# Patient Record
Sex: Male | Born: 1955
Health system: Southern US, Community
[De-identification: ages and names within clinical notes are randomized; demographics above are authoritative.]

## PROBLEM LIST (undated history)

## (undated) DIAGNOSIS — N529 Male erectile dysfunction, unspecified: Secondary | ICD-10-CM

## (undated) DIAGNOSIS — C801 Malignant (primary) neoplasm, unspecified: Secondary | ICD-10-CM

## (undated) DIAGNOSIS — E785 Hyperlipidemia, unspecified: Secondary | ICD-10-CM

## (undated) DIAGNOSIS — I10 Essential (primary) hypertension: Secondary | ICD-10-CM

## (undated) HISTORY — PX: RADICAL ORCHIECTOMY: SHX2285

## (undated) HISTORY — PX: HERNIA REPAIR: SHX51

## (undated) HISTORY — DX: Male erectile dysfunction, unspecified: N52.9

## (undated) HISTORY — DX: Hyperlipidemia, unspecified: E78.5

## (undated) HISTORY — DX: Malignant (primary) neoplasm, unspecified: C80.1

## (undated) HISTORY — DX: Essential (primary) hypertension: I10

---

## 2009-09-26 ENCOUNTER — Ambulatory Visit: Payer: Self-pay | Admitting: Gastroenterology

## 2011-02-18 ENCOUNTER — Ambulatory Visit: Payer: Self-pay | Admitting: Surgery

## 2011-02-25 ENCOUNTER — Ambulatory Visit: Payer: Self-pay | Admitting: Surgery

## 2011-03-13 ENCOUNTER — Ambulatory Visit: Payer: Self-pay | Admitting: Gastroenterology

## 2011-03-18 LAB — PATHOLOGY REPORT

## 2013-11-10 ENCOUNTER — Ambulatory Visit: Payer: Self-pay | Admitting: Urology

## 2013-11-19 DIAGNOSIS — N138 Other obstructive and reflux uropathy: Secondary | ICD-10-CM | POA: Insufficient documentation

## 2013-11-19 DIAGNOSIS — N401 Enlarged prostate with lower urinary tract symptoms: Secondary | ICD-10-CM

## 2013-12-01 DIAGNOSIS — C772 Secondary and unspecified malignant neoplasm of intra-abdominal lymph nodes: Secondary | ICD-10-CM | POA: Insufficient documentation

## 2013-12-01 DIAGNOSIS — C6290 Malignant neoplasm of unspecified testis, unspecified whether descended or undescended: Secondary | ICD-10-CM | POA: Insufficient documentation

## 2014-05-02 ENCOUNTER — Ambulatory Visit: Payer: Self-pay | Admitting: Gastroenterology

## 2014-09-11 ENCOUNTER — Other Ambulatory Visit: Payer: Self-pay

## 2014-09-11 DIAGNOSIS — I10 Essential (primary) hypertension: Secondary | ICD-10-CM | POA: Insufficient documentation

## 2014-09-11 DIAGNOSIS — I429 Cardiomyopathy, unspecified: Secondary | ICD-10-CM | POA: Insufficient documentation

## 2014-09-11 DIAGNOSIS — R030 Elevated blood-pressure reading, without diagnosis of hypertension: Secondary | ICD-10-CM | POA: Insufficient documentation

## 2014-09-11 DIAGNOSIS — N529 Male erectile dysfunction, unspecified: Secondary | ICD-10-CM | POA: Insufficient documentation

## 2014-09-11 DIAGNOSIS — G4730 Sleep apnea, unspecified: Secondary | ICD-10-CM | POA: Insufficient documentation

## 2014-09-11 DIAGNOSIS — N5089 Other specified disorders of the male genital organs: Secondary | ICD-10-CM | POA: Insufficient documentation

## 2014-09-12 ENCOUNTER — Ambulatory Visit (INDEPENDENT_AMBULATORY_CARE_PROVIDER_SITE_OTHER): Payer: BLUE CROSS/BLUE SHIELD | Admitting: Family Medicine

## 2014-09-12 ENCOUNTER — Encounter: Payer: Self-pay | Admitting: Family Medicine

## 2014-09-12 VITALS — BP 140/80 | HR 80 | Ht 70.0 in | Wt 225.0 lb

## 2014-09-12 DIAGNOSIS — I10 Essential (primary) hypertension: Secondary | ICD-10-CM | POA: Diagnosis not present

## 2014-09-12 DIAGNOSIS — E785 Hyperlipidemia, unspecified: Secondary | ICD-10-CM

## 2014-09-12 DIAGNOSIS — N528 Other male erectile dysfunction: Secondary | ICD-10-CM | POA: Diagnosis not present

## 2014-09-12 DIAGNOSIS — G629 Polyneuropathy, unspecified: Secondary | ICD-10-CM

## 2014-09-12 DIAGNOSIS — R609 Edema, unspecified: Secondary | ICD-10-CM

## 2014-09-12 DIAGNOSIS — N529 Male erectile dysfunction, unspecified: Secondary | ICD-10-CM

## 2014-09-12 MED ORDER — HYDROCHLOROTHIAZIDE 12.5 MG PO TABS: 12.5000 mg | ORAL_TABLET | Freq: Every day | ORAL | Status: DC

## 2014-09-12 MED ORDER — METOPROLOL TARTRATE 50 MG PO TABS: 50.0000 mg | ORAL_TABLET | Freq: Two times a day (BID) | ORAL | Status: DC

## 2014-09-12 MED ORDER — TADALAFIL 20 MG PO TABS: 20.0000 mg | ORAL_TABLET | ORAL | Status: DC | PRN

## 2014-09-12 MED ORDER — PREGABALIN 50 MG PO CAPS: 50.0000 mg | ORAL_CAPSULE | Freq: Three times a day (TID) | ORAL | Status: DC

## 2014-09-12 MED ORDER — LISINOPRIL 20 MG PO TABS: 20.0000 mg | ORAL_TABLET | Freq: Every day | ORAL | Status: DC

## 2014-09-12 NOTE — Progress Notes (Signed)
Name: Jerry Harmon   MRN: [id number]    DOB: 08-08-1955   Date:09/12/2014       Progress Note  Subjective  Chief Complaint  Chief Complaint  Patient presents with  . Erectile Dysfunction  . Hypertension  . Hyperlipidemia    Erectile Dysfunction This is a recurrent problem. The current episode started more than 1 year ago. The problem has been gradually improving since onset. The nature of his difficulty is achieving erection and maintaining erection. He reports no anxiety, decreased libido or performance anxiety. Irritative symptoms do not include frequency, nocturia or urgency. Obstructive symptoms do not include dribbling, an intermittent stream, straining or a weak stream. Pertinent negatives include no chills, dysuria or genital pain. Nothing aggravates the symptoms. Past treatments include sildenafil. The treatment provided no relief. He has been using treatment for 2 or more years. He has had no adverse reactions caused by medications. There are no known risk factors.  Hypertension This is a chronic problem. The current episode started more than 1 year ago. The problem is unchanged. The problem is controlled. Associated symptoms include shortness of breath. Pertinent negatives include no anxiety, blurred vision, chest pain, headaches, malaise/fatigue, neck pain, orthopnea, palpitations, peripheral edema, PND or sweats. There are no associated agents to hypertension. Risk factors for coronary artery disease include dyslipidemia, male gender and obesity. Past treatments include ACE inhibitors, calcium channel blockers and beta blockers. The current treatment provides no improvement. Compliance problems include medication side effects.  There is no history of angina, CVA, heart failure, left ventricular hypertrophy or renovascular disease. There is no history of chronic renal disease.  Hyperlipidemia This is a recurrent problem. The current episode started more than 1 year ago. The problem is  controlled. Recent lipid tests were reviewed and are normal. Exacerbating diseases include obesity. He has no history of chronic renal disease, diabetes, hypothyroidism, liver disease or nephrotic syndrome. Associated symptoms include shortness of breath. Pertinent negatives include no chest pain. Current antihyperlipidemic treatment includes diet change. The current treatment provides no improvement of lipids. There are no compliance problems.  There are no known risk factors for coronary artery disease.    No problem-specific assessment & plan notes found for this encounter.   Past Medical History  Diagnosis Date  . Hyperlipidemia   . Hypertension   . Cancer     testicular  . Erectile dysfunction     Past Surgical History  Procedure Laterality Date  . Hernia repair    . Radical orchiectomy      Family History  Problem Relation Age of Onset  . Heart disease Mother   . Heart disease Brother     History   Social History  . Marital Status: Married    Spouse Name: N/A  . Number of Children: N/A  . Years of Education: N/A   Occupational History  . Not on file.   Social History Main Topics  . Smoking status: Former Research scientist (life sciences)  . Smokeless tobacco: Not on file  . Alcohol Use: No  . Drug Use: No  . Sexual Activity: Yes    Birth Control/ Protection: None   Other Topics Concern  . Not on file   Social History Narrative  . No narrative on file    No Known Allergies   Review of Systems  Constitutional: Negative for fever, chills and malaise/fatigue.  Eyes: Negative for blurred vision.  Respiratory: Positive for shortness of breath. Negative for cough and wheezing.   Cardiovascular: Negative for chest  pain, palpitations, orthopnea and PND.  Gastrointestinal: Negative for heartburn, nausea, vomiting, abdominal pain, diarrhea and constipation.  Genitourinary: Negative for dysuria, urgency, frequency, decreased libido and nocturia.  Musculoskeletal: Negative for joint pain  and neck pain.  Neurological: Negative for dizziness and headaches.  Endo/Heme/Allergies: Negative for polydipsia.  Psychiatric/Behavioral: Negative for depression, suicidal ideas and substance abuse. The patient is not nervous/anxious.      Objective  Filed Vitals:   09/12/14 1455  BP: 140/80  Pulse: 80  Height: 5' 10" (1.778 m)  Weight: 225 lb (102.059 kg)    Physical Exam  Constitutional: He is oriented to person, place, and time and well-developed, well-nourished, and in no distress.  HENT:  Head: Normocephalic.  Right Ear: External ear normal.  Left Ear: External ear normal.  Nose: Nose normal.  Mouth/Throat: Oropharynx is clear and moist.  Eyes: Conjunctivae and EOM are normal. Pupils are equal, round, and reactive to light. Right eye exhibits no discharge. Left eye exhibits no discharge. No scleral icterus.  Neck: Normal range of motion. Neck supple. No JVD present. No tracheal deviation present. No thyromegaly present.  Cardiovascular: Normal rate, regular rhythm, normal heart sounds and intact distal pulses.  Exam reveals no gallop and no friction rub.   No murmur heard. Pulmonary/Chest: Breath sounds normal. No respiratory distress. He has no wheezes. He has no rales.  Abdominal: Soft. Bowel sounds are normal. He exhibits no mass. There is no hepatosplenomegaly. There is no tenderness. There is no rebound, no guarding and no CVA tenderness.  Musculoskeletal: Normal range of motion. He exhibits no edema or tenderness.  Lymphadenopathy:    He has no cervical adenopathy.  Neurological: He is alert and oriented to person, place, and time. He has normal sensation, normal strength, normal reflexes and intact cranial nerves. No cranial nerve deficit.  Skin: Skin is warm. No rash noted.  Psychiatric: Mood and affect normal.      No results found for this or any previous visit (from the past 2160 hour(s)).   Assessment & Plan  Problem List Items Addressed This Visit       Cardiovascular and Mediastinum   BP (high blood pressure) - Primary   Relevant Medications   hydrochlorothiazide (HYDRODIURIL) 12.5 MG tablet   lisinopril (PRINIVIL,ZESTRIL) 20 MG tablet   metoprolol (LOPRESSOR) 50 MG tablet   tadalafil (CIALIS) 20 MG tablet   Other Relevant Orders   Renal Function Panel     Genitourinary   ED (erectile dysfunction) of organic origin   Relevant Medications   tadalafil (CIALIS) 20 MG tablet    Other Visit Diagnoses    Hyperlipidemia        Relevant Medications    hydrochlorothiazide (HYDRODIURIL) 12.5 MG tablet    lisinopril (PRINIVIL,ZESTRIL) 20 MG tablet    metoprolol (LOPRESSOR) 50 MG tablet    tadalafil (CIALIS) 20 MG tablet    Other Relevant Orders    Lipid Profile    Neuropathy        Relevant Medications    pregabalin (LYRICA) 50 MG capsule    Other Relevant Orders    Sed Rate (ESR)    Edema        Relevant Medications    hydrochlorothiazide (HYDRODIURIL) 12.5 MG tablet         Dr. Deanna Jones [facility]  09/12/2014    

## 2014-09-13 LAB — RENAL FUNCTION PANEL
Albumin: 4.5 g/dL (ref 3.5–5.5)
BUN/Creatinine Ratio: 18 (ref 9–20)
BUN: 20 mg/dL (ref 6–24)
CO2: 24 mmol/L (ref 18–29)
CREATININE: 1.13 mg/dL (ref 0.76–1.27)
Calcium: 10.6 mg/dL — ABNORMAL HIGH (ref 8.7–10.2)
Chloride: 102 mmol/L (ref 97–108)
GFR calc Af Amer: 84 mL/min/{1.73_m2} (ref >59)
GFR calc non Af Amer: 72 mL/min/{1.73_m2} (ref >59)
Glucose: 107 mg/dL — ABNORMAL HIGH (ref 65–99)
Phosphorus: 3.7 mg/dL (ref 2.5–4.5)
Potassium: 4.4 mmol/L (ref 3.5–5.2)
SODIUM: 143 mmol/L (ref 134–144)

## 2014-09-13 LAB — LIPID PANEL
Chol/HDL Ratio: 3.4 ratio units (ref 0.0–5.0)
Cholesterol, Total: 179 mg/dL (ref 100–199)
HDL: 52 mg/dL (ref >39)
LDL CALC: 111 mg/dL — AB (ref 0–99)
Triglycerides: 79 mg/dL (ref 0–149)
VLDL Cholesterol Cal: 16 mg/dL (ref 5–40)

## 2014-09-13 LAB — SEDIMENTATION RATE: Sed Rate: 14 mm/hr (ref 0–30)

## 2014-09-14 ENCOUNTER — Ambulatory Visit (INDEPENDENT_AMBULATORY_CARE_PROVIDER_SITE_OTHER): Payer: BLUE CROSS/BLUE SHIELD

## 2014-09-14 DIAGNOSIS — Z23 Encounter for immunization: Secondary | ICD-10-CM | POA: Diagnosis not present

## 2014-10-24 ENCOUNTER — Encounter: Payer: Self-pay | Admitting: Family Medicine

## 2014-10-24 ENCOUNTER — Ambulatory Visit (INDEPENDENT_AMBULATORY_CARE_PROVIDER_SITE_OTHER): Payer: BLUE CROSS/BLUE SHIELD | Admitting: Family Medicine

## 2014-10-24 VITALS — BP 140/82 | HR 82 | Resp 16 | Ht 70.0 in | Wt 232.5 lb

## 2014-10-24 DIAGNOSIS — M1990 Unspecified osteoarthritis, unspecified site: Secondary | ICD-10-CM

## 2014-10-24 DIAGNOSIS — M064 Inflammatory polyarthropathy: Secondary | ICD-10-CM | POA: Diagnosis not present

## 2014-10-24 NOTE — Progress Notes (Addendum)
Name: Jerry Harmon   MRN: [id number]    DOB: [birth year]   Date:10/24/2014       Progress Note  Subjective  Chief Complaint  Chief Complaint  Patient presents with  . Follow-up    1 month  . Hypertension    Patient states that he is still having some swelling.   . Peripheral Neuropathy    Patient states that Lyrica is helping some.     Hypertension This is a recurrent problem. The current episode started more than 1 year ago. The problem has been gradually improving since onset. The problem is controlled. Pertinent negatives include no anxiety, blurred vision, chest pain, headaches, malaise/fatigue, neck pain, orthopnea, palpitations, peripheral edema, PND, shortness of breath or sweats. There are no associated agents to hypertension. There are no known risk factors for coronary artery disease. Past treatments include diuretics. The current treatment provides mild improvement. There are no compliance problems.  There is no history of angina, kidney disease, CAD/MI, CVA, heart failure, left ventricular hypertrophy, PVD or retinopathy. There is no history of chronic renal disease.    No problem-specific assessment & plan notes found for this encounter.   Past Medical History  Diagnosis Date  . Hyperlipidemia   . Hypertension   . Cancer     testicular  . Erectile dysfunction     Past Surgical History  Procedure Laterality Date  . Hernia repair    . Radical orchiectomy      Family History  Problem Relation Age of Onset  . Heart disease Mother   . Heart disease Brother   . Heart attack Father     History   Social History  . Marital Status: Married    Spouse Name: N/A  . Number of Children: N/A  . Years of Education: N/A   Occupational History  . Not on file.   Social History Main Topics  . Smoking status: Former Research scientist (life sciences)  . Smokeless tobacco: Not on file  . Alcohol Use: No  . Drug Use: No  . Sexual Activity: Yes    Birth Control/ Protection: None   Other Topics  Concern  . Not on file   Social History Narrative    No Known Allergies   Review of Systems  Constitutional: Negative for fever, chills, weight loss and malaise/fatigue.  HENT: Negative for ear discharge, ear pain and sore throat.   Eyes: Negative for blurred vision.  Respiratory: Negative for cough, sputum production, shortness of breath and wheezing.   Cardiovascular: Negative for chest pain, palpitations, orthopnea, leg swelling and PND.  Gastrointestinal: Negative for heartburn, nausea, abdominal pain, diarrhea, constipation, blood in stool and melena.  Genitourinary: Negative for dysuria, urgency, frequency and hematuria.  Musculoskeletal: Negative for myalgias, back pain, joint pain and neck pain.  Skin: Negative for rash.  Neurological: Negative for dizziness, tingling, sensory change, focal weakness and headaches.  Endo/Heme/Allergies: Negative for environmental allergies and polydipsia. Does not bruise/bleed easily.  Psychiatric/Behavioral: Negative for depression and suicidal ideas. The patient is not nervous/anxious and does not have insomnia.      Objective  Filed Vitals:   10/24/14 1426  BP: 140/82  Pulse: 82  Resp: 16  Height: 5\' 10"  (1.778 m)  Weight: 232 lb 8 oz (105.461 kg)    Physical Exam  Constitutional: He is oriented to person, place, and time and well-developed, well-nourished, and in no distress.  HENT:  Head: Normocephalic.  Right Ear: External ear normal.  Left Ear: External ear normal.  Nose:  Nose normal.  Mouth/Throat: Oropharynx is clear and moist.  Eyes: Conjunctivae and EOM are normal. Pupils are equal, round, and reactive to light. Right eye exhibits no discharge. Left eye exhibits no discharge. No scleral icterus.  Neck: Normal range of motion. Neck supple. No JVD present. No tracheal deviation present. No thyromegaly present.  Cardiovascular: Normal rate, regular rhythm, normal heart sounds and intact distal pulses.  Exam reveals no  gallop and no friction rub.   No murmur heard. Pulmonary/Chest: Breath sounds normal. No respiratory distress. He has no wheezes. He has no rales.  Abdominal: Soft. Bowel sounds are normal. He exhibits no mass. There is no hepatosplenomegaly. There is no tenderness. There is no rebound, no guarding and no CVA tenderness.  Musculoskeletal: Normal range of motion. He exhibits no edema or tenderness.  Lymphadenopathy:    He has no cervical adenopathy.  Neurological: He is alert and oriented to person, place, and time. He has normal sensation, normal strength, normal reflexes and intact cranial nerves. No cranial nerve deficit.  Skin: Skin is warm. No rash noted.  Psychiatric: Mood and affect normal.  Nursing note and vitals reviewed.     Assessment & Plan  Problem List Items Addressed This Visit    None    Visit Diagnoses    Inflammatory arthropathy    -  Primary    Relevant Medications    ibuprofen (ADVIL,MOTRIN) 200 MG tablet    Other Relevant Orders    Ambulatory referral to Rheumatology         Dr. Otilio Miu [facility]  10/24/2014

## 2014-10-24 NOTE — Addendum Note (Signed)
Addended by: Juline Patch on: 10/24/2014 02:55 PM   Modules accepted: Miquel Dunn

## 2014-11-12 ENCOUNTER — Other Ambulatory Visit: Payer: Self-pay | Admitting: Family Medicine

## 2014-11-12 DIAGNOSIS — G629 Polyneuropathy, unspecified: Secondary | ICD-10-CM

## 2015-01-14 ENCOUNTER — Other Ambulatory Visit: Payer: Self-pay | Admitting: Family Medicine

## 2015-02-12 ENCOUNTER — Other Ambulatory Visit: Payer: Self-pay | Admitting: Family Medicine

## 2015-03-04 ENCOUNTER — Other Ambulatory Visit: Payer: Self-pay | Admitting: Family Medicine

## 2015-03-05 ENCOUNTER — Other Ambulatory Visit: Payer: Self-pay

## 2015-03-12 ENCOUNTER — Encounter: Payer: Self-pay | Admitting: Family Medicine

## 2015-03-12 ENCOUNTER — Ambulatory Visit (INDEPENDENT_AMBULATORY_CARE_PROVIDER_SITE_OTHER): Payer: BLUE CROSS/BLUE SHIELD | Admitting: Family Medicine

## 2015-03-12 VITALS — BP 120/70 | HR 60 | Ht 70.0 in | Wt 234.0 lb

## 2015-03-12 DIAGNOSIS — T451X5A Adverse effect of antineoplastic and immunosuppressive drugs, initial encounter: Secondary | ICD-10-CM

## 2015-03-12 DIAGNOSIS — G620 Drug-induced polyneuropathy: Secondary | ICD-10-CM | POA: Diagnosis not present

## 2015-03-12 DIAGNOSIS — R5382 Chronic fatigue, unspecified: Secondary | ICD-10-CM | POA: Diagnosis not present

## 2015-03-12 DIAGNOSIS — I10 Essential (primary) hypertension: Secondary | ICD-10-CM

## 2015-03-12 DIAGNOSIS — E785 Hyperlipidemia, unspecified: Secondary | ICD-10-CM

## 2015-03-12 MED ORDER — LISINOPRIL 20 MG PO TABS: ORAL_TABLET | ORAL | Status: DC

## 2015-03-12 MED ORDER — METOPROLOL TARTRATE 50 MG PO TABS: ORAL_TABLET | ORAL | Status: DC

## 2015-03-12 MED ORDER — PREGABALIN 75 MG PO CAPS: 75.0000 mg | ORAL_CAPSULE | Freq: Two times a day (BID) | ORAL | Status: DC

## 2015-03-12 MED ORDER — FISH OIL BURP-LESS 1000 MG PO CAPS: 1.0000 | ORAL_CAPSULE | Freq: Every day | ORAL | Status: DC

## 2015-03-12 NOTE — Patient Instructions (Signed)

## 2015-03-12 NOTE — Progress Notes (Signed)
Name: Jerry Harmon   MRN: [id number]    DOB: 02-07-1956   Date:03/12/2015       Progress Note  Subjective  Chief Complaint  Chief Complaint  Patient presents with  . Hypertension  . Hyperlipidemia  . Peripheral Neuropathy    needs refill on Lyrica    Hypertension This is a chronic problem. The current episode started more than 1 year ago. The problem has been waxing and waning since onset. The problem is controlled. Pertinent negatives include no anxiety, blurred vision, chest pain, headaches, malaise/fatigue, neck pain, orthopnea, palpitations, peripheral edema, PND, shortness of breath or sweats. There are no associated agents to hypertension. Risk factors for coronary artery disease include dyslipidemia and obesity. Past treatments include diuretics, beta blockers and ACE inhibitors. The current treatment provides moderate improvement. Hypertensive end-organ damage includes a thyroid problem. There is no history of angina, kidney disease, CAD/MI, CVA, heart failure, left ventricular hypertrophy, PVD, renovascular disease or retinopathy. There is no history of chronic renal disease or a hypertension causing med.  Hyperlipidemia This is a chronic problem. The current episode started more than 1 year ago. The problem is controlled. Recent lipid tests were reviewed and are normal. Exacerbating diseases include obesity. He has no history of chronic renal disease, diabetes, hypothyroidism, liver disease or nephrotic syndrome. There are no known factors aggravating his hyperlipidemia. Pertinent negatives include no chest pain, focal sensory loss, focal weakness, leg pain, myalgias or shortness of breath. Current antihyperlipidemic treatment includes statins. The current treatment provides mild improvement of lipids. There are no compliance problems.  Risk factors for coronary artery disease include dyslipidemia and hypertension.  Leg Pain  The incident occurred more than 1 week ago. There was no  injury mechanism (chemotherapy).  Thyroid Problem Presents for follow-up visit. Symptoms include depressed mood and fatigue. Patient reports no anxiety, cold intolerance, constipation, diaphoresis, diarrhea, dry skin, heat intolerance, hoarse voice, leg swelling, nail problem, palpitations, tremors, visual change, weight gain or weight loss. The symptoms have been stable. Past treatments include nothing. The treatment provided mild relief. His past medical history is significant for hyperlipidemia and obesity. There is no history of atrial fibrillation, dementia, diabetes, Graves' ophthalmopathy, heart failure or neuropathy.    No problem-specific assessment & plan notes found for this encounter.   Past Medical History  Diagnosis Date  . Hyperlipidemia   . Hypertension   . Cancer ([place])     testicular  . Erectile dysfunction     Past Surgical History  Procedure Laterality Date  . Hernia repair    . Radical orchiectomy      Family History  Problem Relation Age of Onset  . Heart disease Mother   . Heart disease Brother   . Heart attack Father     Social History   Social History  . Marital Status: Married    Spouse Name: N/A  . Number of Children: N/A  . Years of Education: N/A   Occupational History  . Not on file.   Social History Main Topics  . Smoking status: Former Research scientist (life sciences)  . Smokeless tobacco: Not on file  . Alcohol Use: No  . Drug Use: No  . Sexual Activity: Yes    Birth Control/ Protection: None   Other Topics Concern  . Not on file   Social History Narrative    No Known Allergies   Review of Systems  Constitutional: Positive for fatigue. Negative for weight loss, weight gain, malaise/fatigue and diaphoresis.  HENT: Negative for hoarse  voice.   Eyes: Negative for blurred vision.  Respiratory: Negative for shortness of breath.   Cardiovascular: Negative for chest pain, palpitations, orthopnea and PND.  Gastrointestinal: Negative for diarrhea and  constipation.  Musculoskeletal: Negative for myalgias and neck pain.  Neurological: Negative for tremors, focal weakness and headaches.  Endo/Heme/Allergies: Negative for cold intolerance and heat intolerance.  Psychiatric/Behavioral: The patient is not nervous/anxious.      Objective  Filed Vitals:   03/12/15 1557  BP: 120/70  Pulse: 60  Height: 5' 10" (1.778 m)  Weight: 234 lb (106.142 kg)    Physical Exam  Constitutional: He is oriented to person, place, and time and well-developed, well-nourished, and in no distress.  HENT:  Head: Normocephalic.  Right Ear: External ear normal.  Left Ear: External ear normal.  Nose: Nose normal.  Mouth/Throat: Oropharynx is clear and moist.  Eyes: Conjunctivae and EOM are normal. Pupils are equal, round, and reactive to light. Right eye exhibits no discharge. Left eye exhibits no discharge. No scleral icterus.  Neck: Normal range of motion. Neck supple. No JVD present. No tracheal deviation present. No thyromegaly present.  Cardiovascular: Normal rate, regular rhythm, normal heart sounds and intact distal pulses.  Exam reveals no gallop and no friction rub.   No murmur heard. Pulmonary/Chest: Breath sounds normal. No respiratory distress. He has no wheezes. He has no rales.  Abdominal: Soft. Bowel sounds are normal. He exhibits no mass. There is no hepatosplenomegaly. There is no tenderness. There is no rebound, no guarding and no CVA tenderness.  Musculoskeletal: Normal range of motion. He exhibits no edema or tenderness.  Lymphadenopathy:    He has no cervical adenopathy.  Neurological: He is alert and oriented to person, place, and time. He has normal sensation, normal strength, normal reflexes and intact cranial nerves. No cranial nerve deficit.  Skin: Skin is warm. No rash noted.  Psychiatric: Mood and affect normal.      Assessment & Plan  Problem List Items Addressed This Visit      Cardiovascular and Mediastinum   BP (high  blood pressure) - Primary   Relevant Medications   lisinopril (PRINIVIL,ZESTRIL) 20 MG tablet   metoprolol (LOPRESSOR) 50 MG tablet   Other Relevant Orders   Renal Function Panel    Other Visit Diagnoses    Hyperlipidemia        Relevant Medications    lisinopril (PRINIVIL,ZESTRIL) 20 MG tablet    metoprolol (LOPRESSOR) 50 MG tablet    Omega-3 Fatty Acids (FISH OIL BURP-LESS) 1000 MG CAPS    Other Relevant Orders    Lipid Profile    Cisplatin induced neuropathy (HCC)        Relevant Medications    pregabalin (LYRICA) 75 MG capsule    Chronic fatigue        Relevant Orders    Sed Rate (ESR)    TSH         Dr. Deanna Jones [facility]  03/12/2015

## 2015-03-13 LAB — SEDIMENTATION RATE: SED RATE: 15 mm/h (ref 0–30)

## 2015-03-13 LAB — LIPID PANEL
CHOLESTEROL TOTAL: 172 mg/dL (ref 100–199)
Chol/HDL Ratio: 3.4 ratio units (ref 0.0–5.0)
HDL: 51 mg/dL (ref >39)
LDL Calculated: 106 mg/dL — ABNORMAL HIGH (ref 0–99)
Triglycerides: 77 mg/dL (ref 0–149)
VLDL CHOLESTEROL CAL: 15 mg/dL (ref 5–40)

## 2015-03-13 LAB — TSH: TSH: 2.6 u[IU]/mL (ref 0.450–4.500)

## 2015-03-13 LAB — RENAL FUNCTION PANEL: Phosphorus: 3.2 mg/dL (ref 2.5–4.5)

## 2015-03-23 ENCOUNTER — Other Ambulatory Visit: Payer: Self-pay | Admitting: Family Medicine

## 2015-03-28 ENCOUNTER — Other Ambulatory Visit: Payer: Self-pay

## 2015-03-28 DIAGNOSIS — J329 Chronic sinusitis, unspecified: Secondary | ICD-10-CM

## 2015-03-28 DIAGNOSIS — J309 Allergic rhinitis, unspecified: Secondary | ICD-10-CM

## 2015-03-28 MED ORDER — LORATADINE 10 MG PO TABS: 10.0000 mg | ORAL_TABLET | Freq: Every day | ORAL | Status: DC

## 2015-03-28 MED ORDER — AZITHROMYCIN 250 MG PO TABS: ORAL_TABLET | ORAL | Status: DC

## 2015-07-29 ENCOUNTER — Other Ambulatory Visit: Payer: Self-pay | Admitting: Family Medicine

## 2015-07-31 DIAGNOSIS — G5601 Carpal tunnel syndrome, right upper limb: Secondary | ICD-10-CM | POA: Diagnosis not present

## 2015-07-31 DIAGNOSIS — M65311 Trigger thumb, right thumb: Secondary | ICD-10-CM | POA: Diagnosis not present

## 2015-07-31 DIAGNOSIS — G629 Polyneuropathy, unspecified: Secondary | ICD-10-CM | POA: Diagnosis not present

## 2015-07-31 DIAGNOSIS — G5602 Carpal tunnel syndrome, left upper limb: Secondary | ICD-10-CM | POA: Diagnosis not present

## 2015-08-16 DIAGNOSIS — C6211 Malignant neoplasm of descended right testis: Secondary | ICD-10-CM | POA: Diagnosis not present

## 2015-08-16 DIAGNOSIS — C6290 Malignant neoplasm of unspecified testis, unspecified whether descended or undescended: Secondary | ICD-10-CM | POA: Diagnosis not present

## 2015-08-16 DIAGNOSIS — R918 Other nonspecific abnormal finding of lung field: Secondary | ICD-10-CM | POA: Diagnosis not present

## 2015-08-16 DIAGNOSIS — Z6830 Body mass index (BMI) 30.0-30.9, adult: Secondary | ICD-10-CM | POA: Diagnosis not present

## 2015-08-16 DIAGNOSIS — I700 Atherosclerosis of aorta: Secondary | ICD-10-CM | POA: Diagnosis not present

## 2015-08-23 ENCOUNTER — Encounter: Payer: Self-pay | Admitting: Family Medicine

## 2015-08-23 ENCOUNTER — Ambulatory Visit (INDEPENDENT_AMBULATORY_CARE_PROVIDER_SITE_OTHER): Payer: BLUE CROSS/BLUE SHIELD | Admitting: Family Medicine

## 2015-08-23 VITALS — BP 90/64 | HR 94 | Ht 70.0 in | Wt 201.0 lb

## 2015-08-23 DIAGNOSIS — J40 Bronchitis, not specified as acute or chronic: Secondary | ICD-10-CM

## 2015-08-23 MED ORDER — LEVOFLOXACIN 500 MG PO TABS: 500.0000 mg | ORAL_TABLET | Freq: Every day | ORAL | Status: DC

## 2015-08-23 MED ORDER — GUAIFENESIN-CODEINE 100-10 MG/5ML PO SYRP: 5.0000 mL | ORAL_SOLUTION | Freq: Three times a day (TID) | ORAL | Status: DC | PRN

## 2015-08-23 NOTE — Progress Notes (Signed)
Name: Jerry Harmon   MRN: [id number]    DOB: 1955-09-27   Date:08/23/2015       Progress Note  Subjective  Chief Complaint  Chief Complaint  Patient presents with  . Sinusitis    cough and cong, headache, sore throat, yellow production from nose- tried Alkaseltzer Plus/ not helping    Sinusitis This is a new problem. The current episode started in the past 7 days. The problem has been waxing and waning since onset. There has been no fever. He is experiencing no pain. Associated symptoms include chills, congestion, coughing, headaches, a hoarse voice, sinus pressure and a sore throat. Pertinent negatives include no diaphoresis, ear pain, neck pain, shortness of breath, sneezing or swollen glands. Past treatments include acetaminophen. The treatment provided no relief.  Cough This is a new problem. The current episode started in the past 7 days. The problem has been gradually worsening. The cough is non-productive. Associated symptoms include chills, headaches, nasal congestion, postnasal drip and a sore throat. Pertinent negatives include no chest pain, ear pain, fever, heartburn, myalgias, rash, shortness of breath, weight loss or wheezing. He has tried OTC cough suppressant for the symptoms. The treatment provided no relief. There is no history of environmental allergies.    No problem-specific assessment & plan notes found for this encounter.   Past Medical History  Diagnosis Date  . Hyperlipidemia   . Hypertension   . Cancer ([place])     testicular  . Erectile dysfunction     Past Surgical History  Procedure Laterality Date  . Hernia repair    . Radical orchiectomy      Family History  Problem Relation Age of Onset  . Heart disease Mother   . Heart disease Brother   . Heart attack Father     Social History   Social History  . Marital Status: Married    Spouse Name: N/A  . Number of Children: N/A  . Years of Education: N/A   Occupational History  . Not on file.    Social History Main Topics  . Smoking status: Former Research scientist (life sciences)  . Smokeless tobacco: Not on file  . Alcohol Use: No  . Drug Use: No  . Sexual Activity: Yes    Birth Control/ Protection: None   Other Topics Concern  . Not on file   Social History Narrative    No Known Allergies   Review of Systems  Constitutional: Positive for chills. Negative for fever, weight loss, malaise/fatigue and diaphoresis.  HENT: Positive for congestion, hoarse voice, postnasal drip, sinus pressure and sore throat. Negative for ear discharge, ear pain and sneezing.   Eyes: Negative for blurred vision.  Respiratory: Positive for cough. Negative for sputum production, shortness of breath and wheezing.   Cardiovascular: Negative for chest pain, palpitations and leg swelling.  Gastrointestinal: Negative for heartburn, nausea, abdominal pain, diarrhea, constipation, blood in stool and melena.  Genitourinary: Negative for dysuria, urgency, frequency and hematuria.  Musculoskeletal: Negative for myalgias, back pain, joint pain and neck pain.  Skin: Negative for rash.  Neurological: Positive for headaches. Negative for dizziness, tingling, sensory change and focal weakness.  Endo/Heme/Allergies: Negative for environmental allergies and polydipsia. Does not bruise/bleed easily.  Psychiatric/Behavioral: Negative for depression and suicidal ideas. The patient is not nervous/anxious and does not have insomnia.      Objective  Filed Vitals:   08/23/15 1037  BP: 90/64  Pulse: 94  Height: 5\' 10"  (1.778 m)  Weight: 201 lb (91.173 kg)  Physical Exam  Constitutional: He is oriented to person, place, and time and well-developed, well-nourished, and in no distress.  HENT:  Head: Normocephalic.  Right Ear: External ear normal.  Left Ear: External ear normal.  Nose: Nose normal.  Mouth/Throat: Oropharynx is clear and moist.  Eyes: Conjunctivae and EOM are normal. Pupils are equal, round, and reactive to  light. Right eye exhibits no discharge. Left eye exhibits no discharge. No scleral icterus.  Neck: Normal range of motion. Neck supple. No JVD present. No tracheal deviation present. No thyromegaly present.  Cardiovascular: Normal rate, regular rhythm, normal heart sounds and intact distal pulses.  Exam reveals no gallop and no friction rub.   No murmur heard. Pulmonary/Chest: Breath sounds normal. No respiratory distress. He has no wheezes. He has no rales.  Abdominal: Soft. Bowel sounds are normal. He exhibits no mass. There is no hepatosplenomegaly. There is no tenderness. There is no rebound, no guarding and no CVA tenderness.  Musculoskeletal: Normal range of motion. He exhibits no edema or tenderness.  Lymphadenopathy:    He has no cervical adenopathy.  Neurological: He is alert and oriented to person, place, and time. He has normal sensation, normal strength, normal reflexes and intact cranial nerves. No cranial nerve deficit.  Skin: Skin is warm. No rash noted.  Psychiatric: Mood and affect normal.  Nursing note and vitals reviewed.     Assessment & Plan  Problem List Items Addressed This Visit    None    Visit Diagnoses    Bronchitis    -  Primary    Relevant Medications    levofloxacin (LEVAQUIN) 500 MG tablet    guaiFENesin-codeine (ROBITUSSIN AC) 100-10 MG/5ML syrup         Dr. Macon [facility]  08/23/2015

## 2015-08-26 ENCOUNTER — Other Ambulatory Visit: Payer: Self-pay | Admitting: Family Medicine

## 2015-08-28 ENCOUNTER — Other Ambulatory Visit: Payer: Self-pay

## 2015-08-28 DIAGNOSIS — J40 Bronchitis, not specified as acute or chronic: Secondary | ICD-10-CM

## 2015-08-28 MED ORDER — LEVOFLOXACIN 500 MG PO TABS: 500.0000 mg | ORAL_TABLET | Freq: Every day | ORAL | Status: DC

## 2015-09-09 ENCOUNTER — Other Ambulatory Visit: Payer: Self-pay | Admitting: Family Medicine

## 2015-09-12 ENCOUNTER — Other Ambulatory Visit: Payer: Self-pay | Admitting: Family Medicine

## 2015-10-11 DIAGNOSIS — C6290 Malignant neoplasm of unspecified testis, unspecified whether descended or undescended: Secondary | ICD-10-CM | POA: Diagnosis not present

## 2015-10-22 ENCOUNTER — Other Ambulatory Visit: Payer: Self-pay | Admitting: Family Medicine

## 2015-10-25 ENCOUNTER — Other Ambulatory Visit: Payer: Self-pay | Admitting: Family Medicine

## 2015-11-05 ENCOUNTER — Other Ambulatory Visit: Payer: Self-pay | Admitting: Family Medicine

## 2015-11-11 ENCOUNTER — Other Ambulatory Visit: Payer: Self-pay | Admitting: Family Medicine

## 2015-11-11 DIAGNOSIS — T451X5A Adverse effect of antineoplastic and immunosuppressive drugs, initial encounter: Principal | ICD-10-CM

## 2015-11-11 DIAGNOSIS — G620 Drug-induced polyneuropathy: Secondary | ICD-10-CM

## 2015-11-13 ENCOUNTER — Ambulatory Visit (INDEPENDENT_AMBULATORY_CARE_PROVIDER_SITE_OTHER): Payer: BLUE CROSS/BLUE SHIELD | Admitting: Family Medicine

## 2015-11-13 ENCOUNTER — Encounter: Payer: Self-pay | Admitting: Family Medicine

## 2015-11-13 VITALS — BP 120/62 | HR 72 | Ht 70.0 in | Wt 202.0 lb

## 2015-11-13 DIAGNOSIS — J301 Allergic rhinitis due to pollen: Secondary | ICD-10-CM

## 2015-11-13 DIAGNOSIS — F4329 Adjustment disorder with other symptoms: Secondary | ICD-10-CM

## 2015-11-13 DIAGNOSIS — C801 Malignant (primary) neoplasm, unspecified: Secondary | ICD-10-CM

## 2015-11-13 DIAGNOSIS — I10 Essential (primary) hypertension: Secondary | ICD-10-CM | POA: Diagnosis not present

## 2015-11-13 DIAGNOSIS — G629 Polyneuropathy, unspecified: Secondary | ICD-10-CM | POA: Insufficient documentation

## 2015-11-13 DIAGNOSIS — G63 Polyneuropathy in diseases classified elsewhere: Secondary | ICD-10-CM

## 2015-11-13 MED ORDER — TRAMADOL HCL 50 MG PO TABS: 50.0000 mg | ORAL_TABLET | Freq: Two times a day (BID) | ORAL | 0 refills | Status: DC | PRN

## 2015-11-13 MED ORDER — LORATADINE 10 MG PO TABS: ORAL_TABLET | ORAL | 6 refills | Status: DC

## 2015-11-13 MED ORDER — METOPROLOL TARTRATE 50 MG PO TABS: ORAL_TABLET | ORAL | 1 refills | Status: DC

## 2015-11-13 MED ORDER — HYDROCHLOROTHIAZIDE 12.5 MG PO CAPS: 12.5000 mg | ORAL_CAPSULE | Freq: Every day | ORAL | 6 refills | Status: DC

## 2015-11-13 MED ORDER — LISINOPRIL 20 MG PO TABS: ORAL_TABLET | ORAL | 1 refills | Status: DC

## 2015-11-13 MED ORDER — PREGABALIN 150 MG PO CAPS: 150.0000 mg | ORAL_CAPSULE | Freq: Two times a day (BID) | ORAL | 6 refills | Status: DC

## 2015-11-13 NOTE — Progress Notes (Signed)
Name: Jerry Harmon   MRN: [id number]    DOB: 12-19-1955   Date:11/13/2015       Progress Note  Subjective  Chief Complaint  Chief Complaint  Patient presents with  . Hypertension  . Peripheral Neuropathy  . Allergic Rhinitis     Hypertension  This is a chronic problem. The current episode started more than 1 year ago. The problem has been gradually worsening since onset. The problem is controlled. Pertinent negatives include no anxiety, blurred vision, chest pain, headaches, malaise/fatigue, neck pain, orthopnea, palpitations, peripheral edema, PND, shortness of breath or sweats. Agents associated with hypertension include NSAIDs. Past treatments include ACE inhibitors, diuretics and beta blockers. The current treatment provides moderate improvement. There are no compliance problems.  There is no history of angina, CAD/MI, CVA, heart failure, left ventricular hypertrophy, PVD or renovascular disease. There is no history of chronic renal disease or a hypertension causing med.  Neurologic Problem  The patient's pertinent negatives include no altered mental status, clumsiness, focal sensory loss, focal weakness, loss of balance, memory loss, near-syncope, slurred speech, syncope, visual change or weakness. Primary symptoms comment: neuropathy. This is a chronic problem. The current episode started more than 1 year ago. The neurological problem developed gradually. The problem has been gradually worsening since onset. Pertinent negatives include no abdominal pain, back pain, chest pain, dizziness, fever, headaches, nausea, neck pain, palpitations or shortness of breath. The treatment provided mild relief.    No problem-specific Assessment & Plan notes found for this encounter.   Past Medical History:  Diagnosis Date  . Cancer ([place])    testicular  . Erectile dysfunction   . Hyperlipidemia   . Hypertension     Past Surgical History:  Procedure Laterality Date  . HERNIA REPAIR    . RADICAL  ORCHIECTOMY      Family History  Problem Relation Age of Onset  . Heart disease Mother   . Heart disease Brother   . Heart attack Father     Social History   Social History  . Marital status: Married    Spouse name: N/A  . Number of children: N/A  . Years of education: N/A   Occupational History  . Not on file.   Social History Main Topics  . Smoking status: Former Research scientist (life sciences)  . Smokeless tobacco: Never Used  . Alcohol use No  . Drug use: No  . Sexual activity: Yes    Birth control/ protection: None   Other Topics Concern  . Not on file   Social History Narrative  . No narrative on file    No Known Allergies   Review of Systems  Constitutional: Negative for chills, fever, malaise/fatigue and weight loss.  HENT: Negative for ear discharge, ear pain and sore throat.   Eyes: Negative for blurred vision.  Respiratory: Negative for cough, sputum production, shortness of breath and wheezing.   Cardiovascular: Negative for chest pain, palpitations, orthopnea, leg swelling, PND and near-syncope.  Gastrointestinal: Negative for abdominal pain, blood in stool, constipation, diarrhea, heartburn, melena and nausea.  Genitourinary: Negative for dysuria, frequency, hematuria and urgency.  Musculoskeletal: Negative for back pain, joint pain, myalgias and neck pain.  Skin: Negative for rash.  Neurological: Negative for dizziness, tingling, sensory change, focal weakness, syncope, weakness, headaches and loss of balance.  Endo/Heme/Allergies: Negative for environmental allergies and polydipsia. Does not bruise/bleed easily.  Psychiatric/Behavioral: Negative for depression, memory loss and suicidal ideas. The patient is not nervous/anxious and does not have insomnia.  Objective  Vitals:   11/13/15 0824  BP: 120/62  Pulse: 72  Weight: 202 lb (91.6 kg)  Height: 5\' 10"  (1.778 m)    Physical Exam  Constitutional: He is oriented to person, place, and time and  well-developed, well-nourished, and in no distress.  HENT:  Head: Normocephalic.  Right Ear: External ear normal.  Left Ear: External ear normal.  Nose: Nose normal.  Mouth/Throat: Oropharynx is clear and moist.  Eyes: Conjunctivae and EOM are normal. Pupils are equal, round, and reactive to light. Right eye exhibits no discharge. Left eye exhibits no discharge. No scleral icterus.  Neck: Normal range of motion. Neck supple. No JVD present. No tracheal deviation present. No thyromegaly present.  Cardiovascular: Normal rate, regular rhythm, normal heart sounds, intact distal pulses and normal pulses.  Exam reveals no gallop and no friction rub.   No murmur heard. Pulmonary/Chest: Breath sounds normal. No respiratory distress. He has no wheezes. He has no rales.  Abdominal: Soft. Bowel sounds are normal. He exhibits no mass. There is no hepatosplenomegaly. There is no tenderness. There is no rebound, no guarding and no CVA tenderness.  Musculoskeletal: Normal range of motion. He exhibits no edema or tenderness.  Lymphadenopathy:    He has no cervical adenopathy.  Neurological: He is alert and oriented to person, place, and time. He has normal sensation, normal strength, normal reflexes and intact cranial nerves. No cranial nerve deficit.  Skin: Skin is warm and intact. No rash noted.  Psychiatric: Mood and affect normal.  Nursing note and vitals reviewed.     Assessment & Plan  Problem List Items Addressed This Visit      Cardiovascular and Mediastinum   BP (high blood pressure) - Primary   Relevant Medications   metoprolol (LOPRESSOR) 50 MG tablet   lisinopril (PRINIVIL,ZESTRIL) 20 MG tablet   hydrochlorothiazide (MICROZIDE) 12.5 MG capsule     Respiratory   Allergic rhinitis due to pollen   Relevant Medications   loratadine (CLARITIN) 10 MG tablet     Nervous and Auditory   Neuropathy associated with cancer (HCC)   Relevant Medications   pregabalin (LYRICA) 150 MG capsule    traMADol (ULTRAM) 50 MG tablet    Other Visit Diagnoses    Adjustment disorder with other symptom       consider cymbalta for mood and pain management        Dr. Amori [facility]  11/13/15

## 2016-01-01 ENCOUNTER — Other Ambulatory Visit: Payer: Self-pay | Admitting: Family Medicine

## 2016-01-22 ENCOUNTER — Other Ambulatory Visit: Payer: Self-pay | Admitting: Family Medicine

## 2016-01-25 ENCOUNTER — Encounter: Payer: Self-pay | Admitting: Family Medicine

## 2016-01-25 ENCOUNTER — Ambulatory Visit (INDEPENDENT_AMBULATORY_CARE_PROVIDER_SITE_OTHER): Payer: BLUE CROSS/BLUE SHIELD | Admitting: Family Medicine

## 2016-01-25 VITALS — BP 138/80 | HR 80 | Ht 70.0 in | Wt 206.0 lb

## 2016-01-25 DIAGNOSIS — J0100 Acute maxillary sinusitis, unspecified: Secondary | ICD-10-CM | POA: Diagnosis not present

## 2016-01-25 MED ORDER — AMOXICILLIN-POT CLAVULANATE 875-125 MG PO TABS: 1.0000 | ORAL_TABLET | Freq: Two times a day (BID) | ORAL | 0 refills | Status: DC

## 2016-01-25 MED ORDER — GUAIFENESIN-CODEINE 100-10 MG/5ML PO SYRP: 5.0000 mL | ORAL_SOLUTION | Freq: Three times a day (TID) | ORAL | 0 refills | Status: DC | PRN

## 2016-01-25 NOTE — Progress Notes (Signed)
Name: Jerry Harmon   MRN: [id number]    DOB: Jan 24, 1956   Date:01/25/2016       Progress Note  Subjective  Chief Complaint  Chief Complaint  Patient presents with  . Sinusitis    cough and cong- Loratadine not helping    Sinusitis  This is a new problem. The current episode started in the past 7 days. The problem has been gradually improving since onset. There has been no fever. The pain is mild. Associated symptoms include congestion, coughing, headaches, a hoarse voice, sinus pressure, sneezing and a sore throat. Pertinent negatives include no chills, ear pain, neck pain, shortness of breath or swollen glands. Past treatments include nothing. The treatment provided mild relief.    No problem-specific Assessment & Plan notes found for this encounter.   Past Medical History:  Diagnosis Date  . Cancer ([place])    testicular  . Erectile dysfunction   . Hyperlipidemia   . Hypertension     Past Surgical History:  Procedure Laterality Date  . HERNIA REPAIR    . RADICAL ORCHIECTOMY      Family History  Problem Relation Age of Onset  . Heart disease Mother   . Heart attack Father   . Heart disease Brother     Social History   Social History  . Marital status: Married    Spouse name: N/A  . Number of children: N/A  . Years of education: N/A   Occupational History  . Not on file.   Social History Main Topics  . Smoking status: Former Research scientist (life sciences)  . Smokeless tobacco: Never Used  . Alcohol use No  . Drug use: No  . Sexual activity: Yes    Birth control/ protection: None   Other Topics Concern  . Not on file   Social History Narrative  . No narrative on file    No Known Allergies   Review of Systems  Constitutional: Negative for chills, fever, malaise/fatigue and weight loss.  HENT: Positive for congestion, hoarse voice, sinus pressure, sneezing and sore throat. Negative for ear discharge and ear pain.   Eyes: Negative for blurred vision.  Respiratory: Positive  for cough. Negative for sputum production, shortness of breath and wheezing.   Cardiovascular: Negative for chest pain, palpitations and leg swelling.  Gastrointestinal: Negative for abdominal pain, blood in stool, constipation, diarrhea, heartburn, melena and nausea.  Genitourinary: Negative for dysuria, frequency, hematuria and urgency.  Musculoskeletal: Negative for back pain, joint pain, myalgias and neck pain.  Skin: Negative for rash.  Neurological: Positive for headaches. Negative for dizziness, tingling, sensory change and focal weakness.  Endo/Heme/Allergies: Negative for environmental allergies and polydipsia. Does not bruise/bleed easily.  Psychiatric/Behavioral: Negative for depression and suicidal ideas. The patient is not nervous/anxious and does not have insomnia.      Objective  Vitals:   01/25/16 1513  BP: 138/80  Pulse: 80  Weight: 206 lb (93.4 kg)  Height: 5\' 10"  (1.778 m)    Physical Exam  Constitutional: He is oriented to person, place, and time and well-developed, well-nourished, and in no distress.  HENT:  Head: Normocephalic.  Right Ear: External ear normal.  Left Ear: External ear normal.  Nose: Nose normal.  Mouth/Throat: Oropharynx is clear and moist.  Eyes: Conjunctivae and EOM are normal. Pupils are equal, round, and reactive to light. Right eye exhibits no discharge. Left eye exhibits no discharge. No scleral icterus.  Neck: Normal range of motion. Neck supple. No JVD present. No tracheal deviation present.  No thyromegaly present.  Cardiovascular: Normal rate, regular rhythm, normal heart sounds and intact distal pulses.  Exam reveals no gallop and no friction rub.   No murmur heard. Pulmonary/Chest: Breath sounds normal. No respiratory distress. He has no wheezes. He has no rales.  Abdominal: Soft. Bowel sounds are normal. He exhibits no mass. There is no hepatosplenomegaly. There is no tenderness. There is no rebound, no guarding and no CVA  tenderness.  Musculoskeletal: Normal range of motion. He exhibits no edema or tenderness.  Lymphadenopathy:    He has no cervical adenopathy.  Neurological: He is alert and oriented to person, place, and time. He has normal sensation, normal strength and intact cranial nerves. No cranial nerve deficit.  Skin: Skin is warm. No rash noted.  Psychiatric: Mood and affect normal.  Nursing note and vitals reviewed.     Assessment & Plan  Problem List Items Addressed This Visit    None    Visit Diagnoses    Acute non-recurrent maxillary sinusitis    -  Primary   Relevant Medications   amoxicillin-clavulanate (AUGMENTIN) 875-125 MG tablet   guaiFENesin-codeine (ROBITUSSIN AC) 100-10 MG/5ML syrup        Dr. Macon [facility]  01/25/16

## 2016-01-28 ENCOUNTER — Other Ambulatory Visit: Payer: Self-pay | Admitting: Family Medicine

## 2016-01-29 ENCOUNTER — Other Ambulatory Visit: Payer: Self-pay

## 2016-03-27 ENCOUNTER — Encounter: Payer: Self-pay | Admitting: Family Medicine

## 2016-03-27 ENCOUNTER — Ambulatory Visit (INDEPENDENT_AMBULATORY_CARE_PROVIDER_SITE_OTHER): Payer: BLUE CROSS/BLUE SHIELD | Admitting: Family Medicine

## 2016-03-27 VITALS — BP 120/70 | HR 64 | Ht 70.0 in | Wt 233.0 lb

## 2016-03-27 DIAGNOSIS — F4320 Adjustment disorder, unspecified: Secondary | ICD-10-CM | POA: Insufficient documentation

## 2016-03-27 DIAGNOSIS — N5239 Other post-surgical erectile dysfunction: Secondary | ICD-10-CM | POA: Diagnosis not present

## 2016-03-27 DIAGNOSIS — G629 Polyneuropathy, unspecified: Secondary | ICD-10-CM | POA: Diagnosis not present

## 2016-03-27 MED ORDER — DULOXETINE HCL 30 MG PO CPEP: 30.0000 mg | ORAL_CAPSULE | Freq: Every day | ORAL | 1 refills | Status: DC

## 2016-03-27 NOTE — Progress Notes (Signed)
Name: Jerry Harmon   MRN: [id number]    DOB: 02/20/1956   Date:03/27/2016       Progress Note  Subjective  Chief Complaint  Chief Complaint  Patient presents with  . Leg Pain    feet pain- wants to increase on Lyrica    Leg Pain   The incident occurred more than 1 week ago. There was no injury mechanism. The pain has been improving since onset. Nothing aggravates the symptoms. He has tried NSAIDs (tramadol/ lyrica) for the symptoms.  Erectile Dysfunction  This is a chronic problem. The current episode started more than 1 year ago. The problem has been waxing and waning since onset. The nature of his difficulty is maintaining erection. He reports no anxiety, decreased libido or performance anxiety. He has been using treatment for 2 or more years. He has had no adverse reactions caused by medications. There are no known risk factors.    No problem-specific Assessment & Plan notes found for this encounter.   Past Medical History:  Diagnosis Date  . Cancer ([place])    testicular  . Erectile dysfunction   . Hyperlipidemia   . Hypertension     Past Surgical History:  Procedure Laterality Date  . HERNIA REPAIR    . RADICAL ORCHIECTOMY      Family History  Problem Relation Age of Onset  . Heart disease Mother   . Heart attack Father   . Heart disease Brother     Social History   Social History  . Marital status: Married    Spouse name: N/A  . Number of children: N/A  . Years of education: N/A   Occupational History  . Not on file.   Social History Main Topics  . Smoking status: Former Research scientist (life sciences)  . Smokeless tobacco: Never Used  . Alcohol use No  . Drug use: No  . Sexual activity: Yes    Birth control/ protection: None   Other Topics Concern  . Not on file   Social History Narrative  . No narrative on file    No Known Allergies   Review of Systems  Genitourinary: Negative for decreased libido.  Psychiatric/Behavioral: The patient is not nervous/anxious.       Objective  Vitals:   03/27/16 1024  BP: 120/70  Pulse: 64  Weight: 233 lb (105.7 kg)  Height: 5\' 10"  (1.778 m)    Physical Exam  Constitutional: He is oriented to person, place, and time and well-developed, well-nourished, and in no distress.  HENT:  Head: Normocephalic.  Right Ear: External ear normal.  Left Ear: External ear normal.  Nose: Nose normal.  Mouth/Throat: Oropharynx is clear and moist.  Eyes: Conjunctivae and EOM are normal. Pupils are equal, round, and reactive to light. Right eye exhibits no discharge. Left eye exhibits no discharge. No scleral icterus.  Neck: Normal range of motion. Neck supple. No JVD present. No tracheal deviation present. No thyromegaly present.  Cardiovascular: Normal rate, regular rhythm, normal heart sounds and intact distal pulses.  Exam reveals no gallop and no friction rub.   No murmur heard. Pulmonary/Chest: Breath sounds normal. No respiratory distress. He has no wheezes. He has no rales.  Abdominal: Soft. Bowel sounds are normal. He exhibits no mass. There is no hepatosplenomegaly. There is no tenderness. There is no rebound, no guarding and no CVA tenderness.  Musculoskeletal: Normal range of motion. He exhibits no edema or tenderness.  Lymphadenopathy:    He has no cervical adenopathy.  Neurological: He  is alert and oriented to person, place, and time. He has normal sensation, normal strength, normal reflexes and intact cranial nerves. No cranial nerve deficit.  Skin: Skin is warm. No rash noted.  Psychiatric: Mood and affect normal.  Nursing note and vitals reviewed.     Assessment & Plan  Problem List Items Addressed This Visit      Nervous and Auditory   Neuropathy ([place]) - Primary   Relevant Medications   DULoxetine (CYMBALTA) 30 MG capsule     Other   Post-procedural erectile dysfunction   Relevant Orders   Testosterone,Free and Total   Adjustment disorder   Relevant Medications   DULoxetine (CYMBALTA) 30 MG  capsule        Dr. Mac[facility]  03/27/16

## 2016-03-29 LAB — TESTOSTERONE,FREE AND TOTAL
Testosterone, Free: 8.9 pg/mL (ref 7.2–24.0)
Testosterone: 261 ng/dL — ABNORMAL LOW (ref 264–916)

## 2016-03-30 ENCOUNTER — Other Ambulatory Visit: Payer: Self-pay | Admitting: Family Medicine

## 2016-04-14 DIAGNOSIS — Z9079 Acquired absence of other genital organ(s): Secondary | ICD-10-CM | POA: Diagnosis not present

## 2016-04-14 DIAGNOSIS — E278 Other specified disorders of adrenal gland: Secondary | ICD-10-CM | POA: Diagnosis not present

## 2016-04-14 DIAGNOSIS — D649 Anemia, unspecified: Secondary | ICD-10-CM | POA: Diagnosis not present

## 2016-04-14 DIAGNOSIS — K5730 Diverticulosis of large intestine without perforation or abscess without bleeding: Secondary | ICD-10-CM | POA: Diagnosis not present

## 2016-04-14 DIAGNOSIS — G629 Polyneuropathy, unspecified: Secondary | ICD-10-CM | POA: Diagnosis not present

## 2016-04-14 DIAGNOSIS — C6291 Malignant neoplasm of right testis, unspecified whether descended or undescended: Secondary | ICD-10-CM | POA: Diagnosis not present

## 2016-04-14 DIAGNOSIS — C6290 Malignant neoplasm of unspecified testis, unspecified whether descended or undescended: Secondary | ICD-10-CM | POA: Diagnosis not present

## 2016-04-14 DIAGNOSIS — R208 Other disturbances of skin sensation: Secondary | ICD-10-CM | POA: Diagnosis not present

## 2016-04-14 DIAGNOSIS — Z8547 Personal history of malignant neoplasm of testis: Secondary | ICD-10-CM | POA: Diagnosis not present

## 2016-04-14 DIAGNOSIS — R739 Hyperglycemia, unspecified: Secondary | ICD-10-CM | POA: Diagnosis not present

## 2016-04-14 DIAGNOSIS — Z79899 Other long term (current) drug therapy: Secondary | ICD-10-CM | POA: Diagnosis not present

## 2016-04-14 DIAGNOSIS — R918 Other nonspecific abnormal finding of lung field: Secondary | ICD-10-CM | POA: Diagnosis not present

## 2016-04-14 DIAGNOSIS — Z08 Encounter for follow-up examination after completed treatment for malignant neoplasm: Secondary | ICD-10-CM | POA: Diagnosis not present

## 2016-04-14 DIAGNOSIS — G4733 Obstructive sleep apnea (adult) (pediatric): Secondary | ICD-10-CM | POA: Diagnosis not present

## 2016-04-14 DIAGNOSIS — K760 Fatty (change of) liver, not elsewhere classified: Secondary | ICD-10-CM | POA: Diagnosis not present

## 2016-04-14 DIAGNOSIS — R51 Headache: Secondary | ICD-10-CM | POA: Diagnosis not present

## 2016-04-14 DIAGNOSIS — Z6833 Body mass index (BMI) 33.0-33.9, adult: Secondary | ICD-10-CM | POA: Diagnosis not present

## 2016-04-14 DIAGNOSIS — G8929 Other chronic pain: Secondary | ICD-10-CM | POA: Diagnosis not present

## 2016-04-14 DIAGNOSIS — Z452 Encounter for adjustment and management of vascular access device: Secondary | ICD-10-CM | POA: Diagnosis not present

## 2016-04-14 DIAGNOSIS — I10 Essential (primary) hypertension: Secondary | ICD-10-CM | POA: Diagnosis not present

## 2016-04-27 ENCOUNTER — Other Ambulatory Visit: Payer: Self-pay | Admitting: Family Medicine

## 2016-04-27 DIAGNOSIS — I10 Essential (primary) hypertension: Secondary | ICD-10-CM

## 2016-05-19 ENCOUNTER — Other Ambulatory Visit: Payer: Self-pay | Admitting: Family Medicine

## 2016-05-19 DIAGNOSIS — G63 Polyneuropathy in diseases classified elsewhere: Principal | ICD-10-CM

## 2016-05-19 DIAGNOSIS — C801 Malignant (primary) neoplasm, unspecified: Secondary | ICD-10-CM

## 2016-06-02 ENCOUNTER — Other Ambulatory Visit: Payer: Self-pay | Admitting: Family Medicine

## 2016-06-02 DIAGNOSIS — G629 Polyneuropathy, unspecified: Secondary | ICD-10-CM

## 2016-06-02 DIAGNOSIS — F4320 Adjustment disorder, unspecified: Secondary | ICD-10-CM

## 2016-06-22 IMAGING — US US SCROTUM W/ DOPPLER COMPLETE
1 series · 13 of 25 positions shown · non-contrast
Comparison: None.

CLINICAL DATA: Testicular mass.

EXAM:
SCROTAL ULTRASOUND
DOPPLER ULTRASOUND OF THE TESTICLES
TECHNIQUE: Complete ultrasound examination of the testicles, epididymis, and
other scrotal structures was performed. Color and spectral Doppler
ultrasound were also utilized to evaluate blood flow to the
testicles.

[Series 1: us scrotum w/ doppler complete · 0.09mm/px · 13 of 60 slices shown]
[im 1/60]
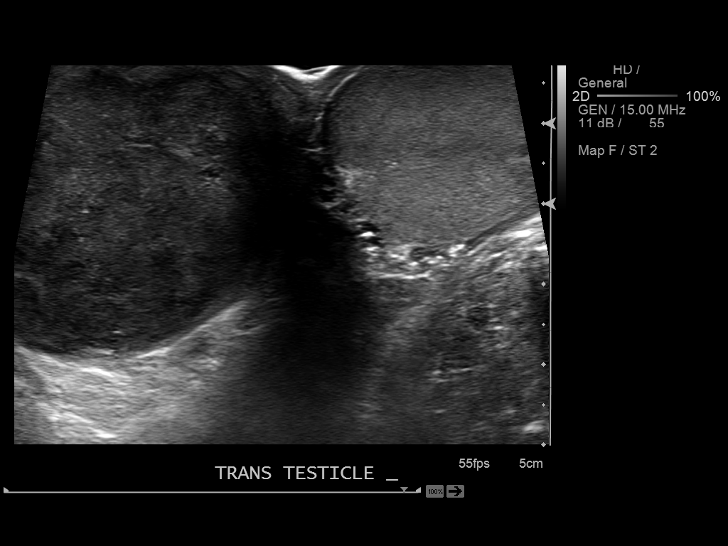
[im 5/60]
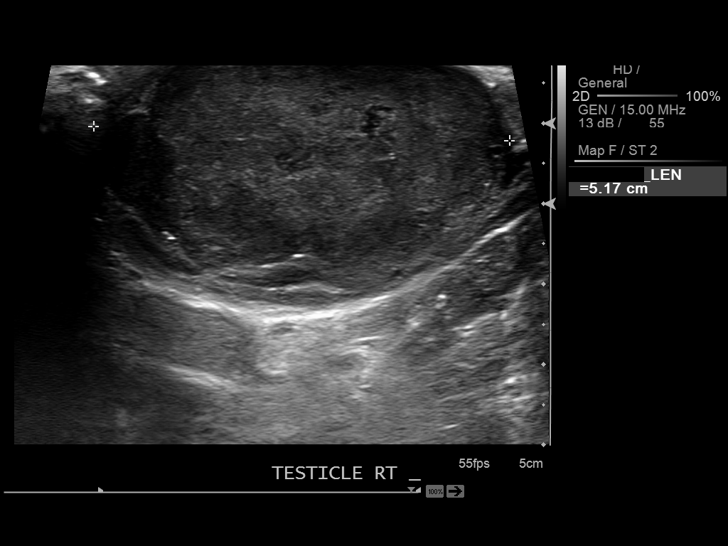
[im 10/60]
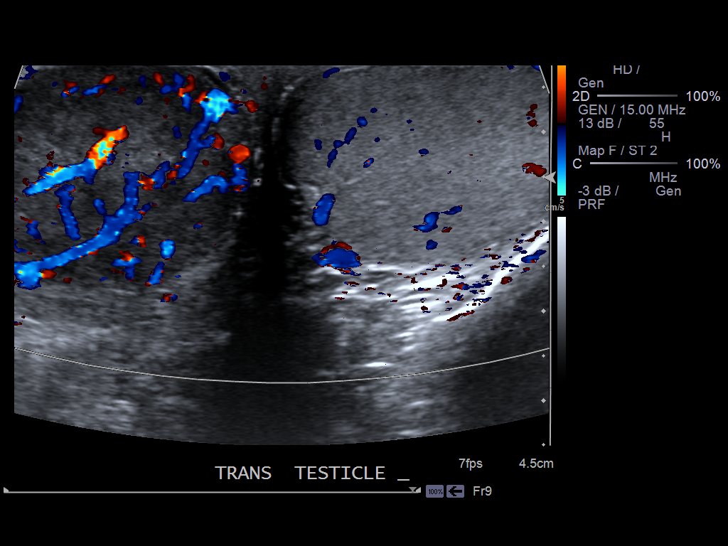
[im 15/60]
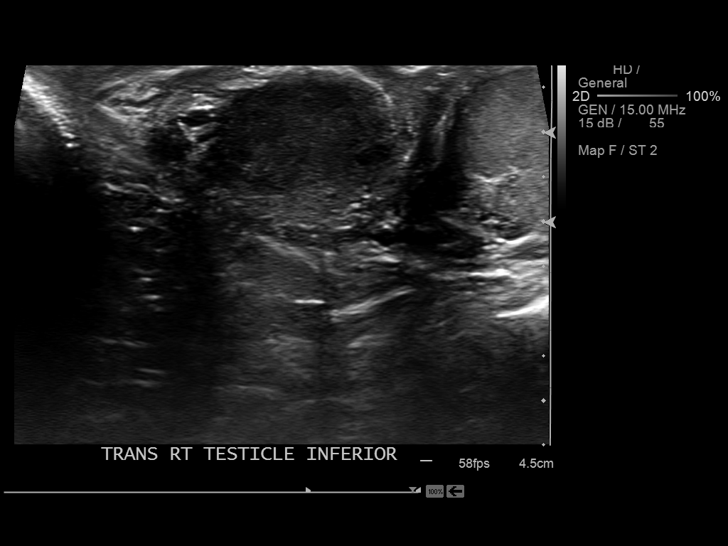
[im 20/60]
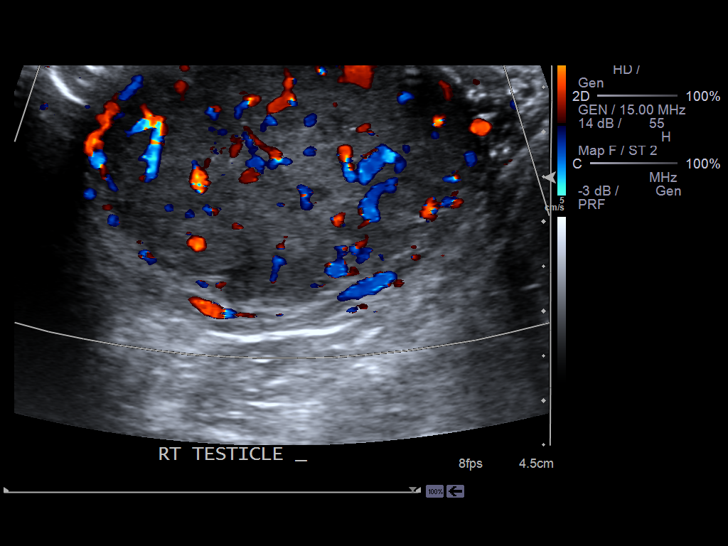
[im 25/60]
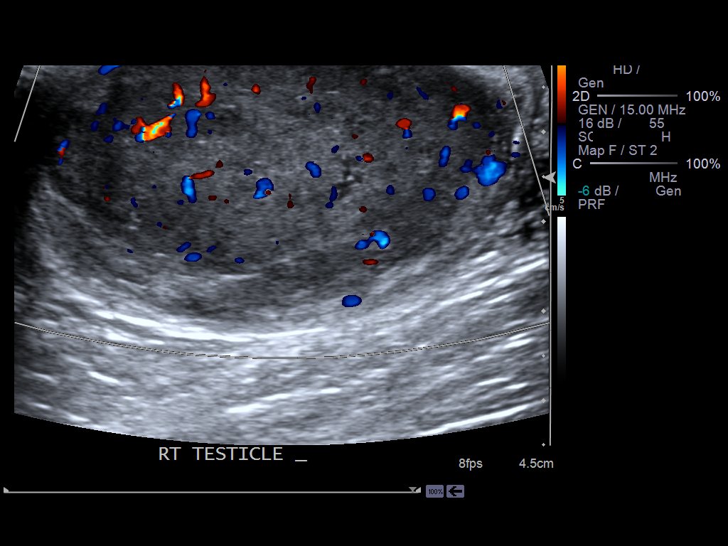
[im 30/60]
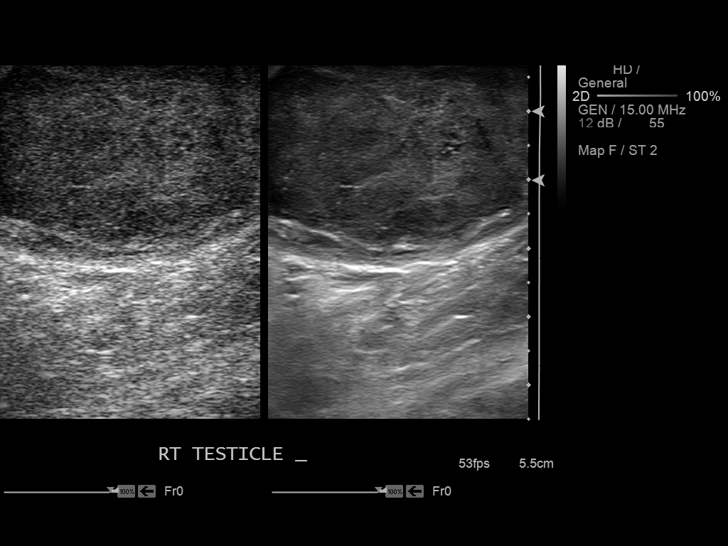
[im 35/60]
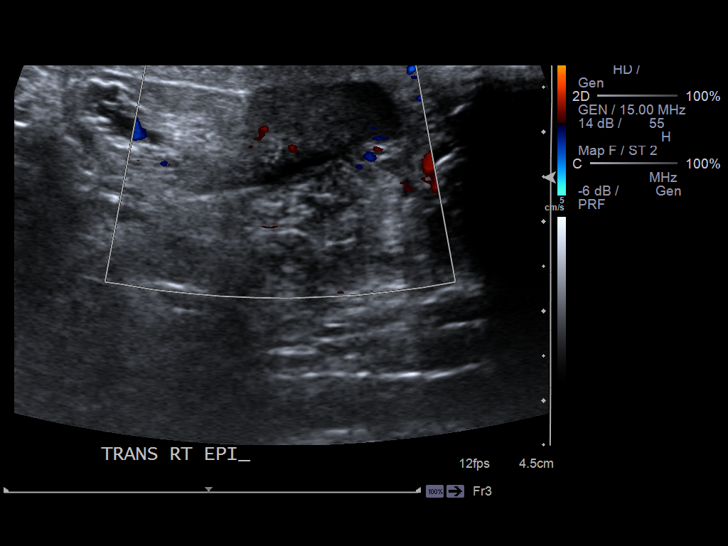
[im 40/60]
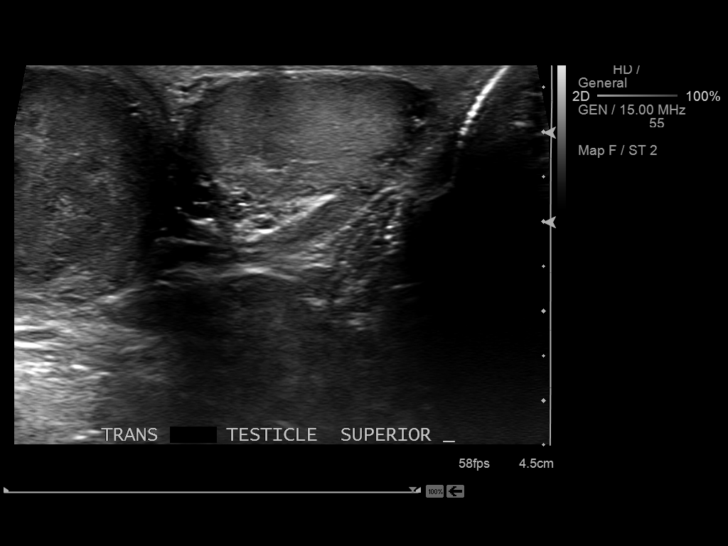
[im 45/60]
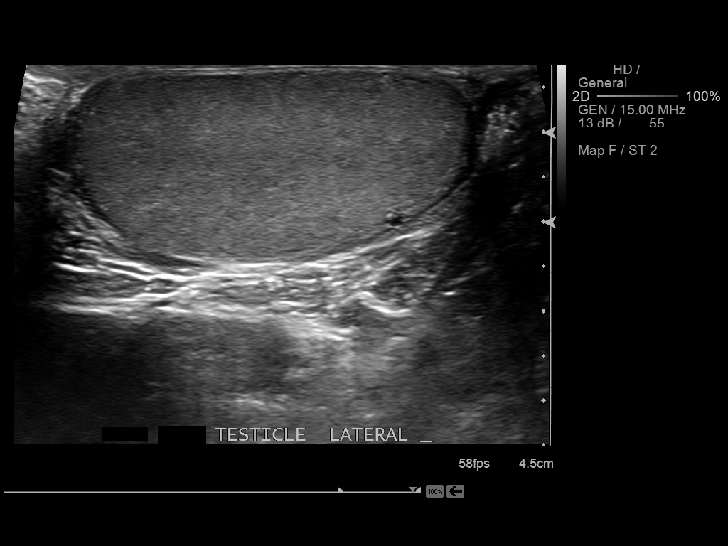
[im 50/60]
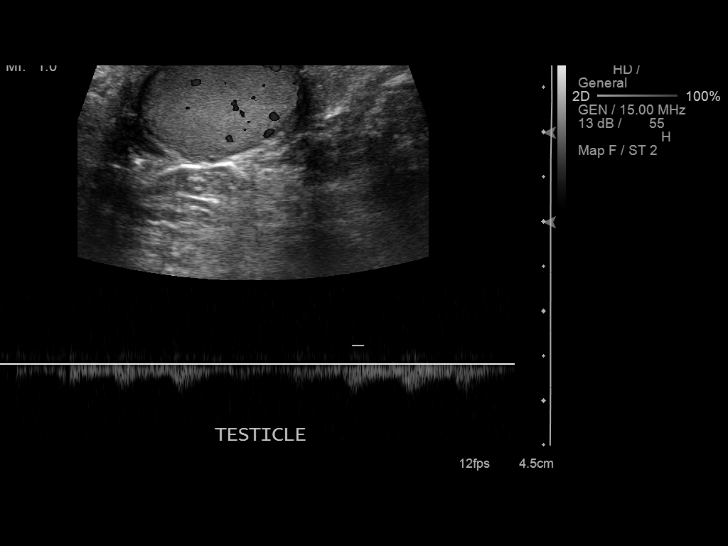
[im 55/60]
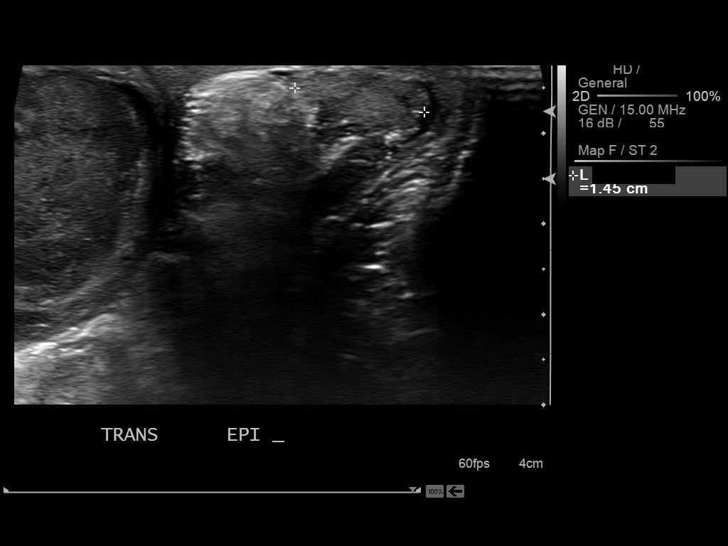
[im 60/60]
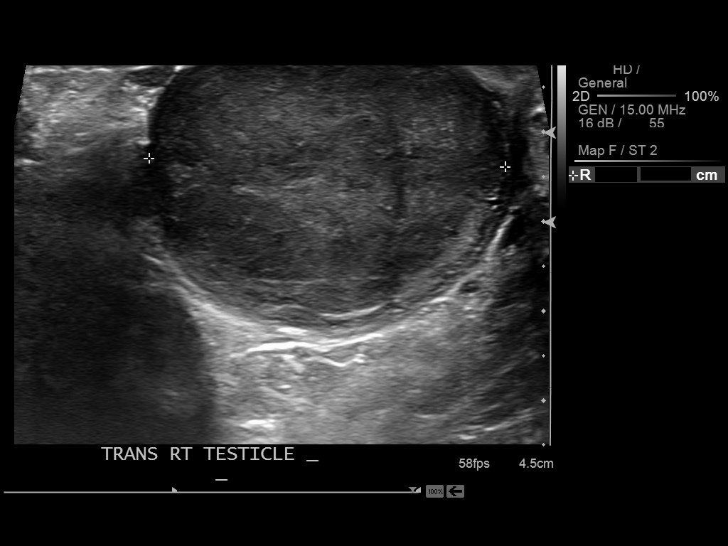

[13 of 25 positions shown; findings below may reference images not displayed]

FINDINGS: Right testicle

Measurements: 5.2 x 3.2 x 4.1 cm. 5.1 x 2.9 by 4.0 cm solid vascular
mass with irregular borders present in the right testicle . This is
consistent with a right testicular malignancy.

Left testicle

Measurements: 4.4 x 2.4 x 3.2 cm. No mass or microlithiasis
visualized.

Right epididymis:  Normal in size and appearance.

Left epididymis:  Normal in size and appearance.

Hydrocele:  None visualized.

Varicocele:  None visualized.

Pulsed Doppler interrogation of both testes demonstrates low
resistance arterial and venous waveforms bilaterally.
IMPRESSION: Large 5.1 cm solid vascular mass with irregular borders present
right testicle consistent with right testicular malignancy. These
results will be called to the ordering clinician or representative
by the Radiologist Assistant, and communication documented in the
PACS or zVision Dashboard.

## 2016-06-23 ENCOUNTER — Other Ambulatory Visit: Payer: Self-pay | Admitting: Family Medicine

## 2016-06-26 ENCOUNTER — Other Ambulatory Visit: Payer: Self-pay

## 2016-06-27 ENCOUNTER — Ambulatory Visit (INDEPENDENT_AMBULATORY_CARE_PROVIDER_SITE_OTHER): Payer: BLUE CROSS/BLUE SHIELD | Admitting: Family Medicine

## 2016-06-27 ENCOUNTER — Encounter: Payer: Self-pay | Admitting: Family Medicine

## 2016-06-27 VITALS — BP 130/70 | HR 68 | Ht 70.0 in | Wt 223.0 lb

## 2016-06-27 DIAGNOSIS — F4320 Adjustment disorder, unspecified: Secondary | ICD-10-CM

## 2016-06-27 DIAGNOSIS — I10 Essential (primary) hypertension: Secondary | ICD-10-CM

## 2016-06-27 DIAGNOSIS — G629 Polyneuropathy, unspecified: Secondary | ICD-10-CM

## 2016-06-27 DIAGNOSIS — G4730 Sleep apnea, unspecified: Secondary | ICD-10-CM

## 2016-06-27 DIAGNOSIS — N529 Male erectile dysfunction, unspecified: Secondary | ICD-10-CM | POA: Diagnosis not present

## 2016-06-27 MED ORDER — HYDROCHLOROTHIAZIDE 12.5 MG PO CAPS: 12.5000 mg | ORAL_CAPSULE | Freq: Every day | ORAL | 1 refills | Status: DC

## 2016-06-27 MED ORDER — LISINOPRIL 20 MG PO TABS: ORAL_TABLET | ORAL | 1 refills | Status: DC

## 2016-06-27 MED ORDER — DULOXETINE HCL 30 MG PO CPEP: 30.0000 mg | ORAL_CAPSULE | Freq: Every day | ORAL | 1 refills | Status: DC

## 2016-06-27 MED ORDER — METOPROLOL TARTRATE 50 MG PO TABS: ORAL_TABLET | ORAL | 1 refills | Status: DC

## 2016-06-27 NOTE — Progress Notes (Signed)
Name: Jerry Harmon   MRN: [id number]    DOB: 1955-09-28   Date:06/27/2016       Progress Note  Subjective  Chief Complaint  Chief Complaint  Patient presents with  . Hypertension  . Peripheral Neuropathy  . Allergic Rhinitis     Patient has concerns for sleep apnea/ snoring/ witness apnea/ previous diagnosed / Patient needs reassestment for titration.    Hypertension  This is a chronic problem. The current episode started more than 1 year ago. The problem is unchanged. The problem is controlled. Pertinent negatives include no anxiety, blurred vision, chest pain, headaches, malaise/fatigue, neck pain, orthopnea, palpitations, peripheral edema, PND, shortness of breath or sweats. There are no associated agents to hypertension. There are no known risk factors for coronary artery disease. Past treatments include ACE inhibitors, beta blockers and diuretics. The current treatment provides mild improvement. There are no compliance problems.  There is no history of angina, kidney disease, CAD/MI, CVA, heart failure, left ventricular hypertrophy, PVD or retinopathy. There is no history of chronic renal disease, a hypertension causing med or renovascular disease.  Depression         This is a chronic problem.  The current episode started more than 1 year ago.   The onset quality is gradual.   The problem occurs daily.  The problem has been gradually improving since onset.  Associated symptoms include no decreased concentration, no fatigue, no helplessness, no hopelessness, does not have insomnia, not irritable, no restlessness, no decreased interest, no appetite change, no body aches, no myalgias, no headaches, no indigestion, not sad and no suicidal ideas.  Past treatments include SNRIs - Serotonin and norepinephrine reuptake inhibitors.  Compliance with treatment is good.  Previous treatment provided mild relief.   Pertinent negatives include no anxiety, no eating disorder and no mental health  disorder.   No problem-specific Assessment & Plan notes found for this encounter.   Past Medical History:  Diagnosis Date  . Cancer ([place])    testicular  . Erectile dysfunction   . Hyperlipidemia   . Hypertension     Past Surgical History:  Procedure Laterality Date  . HERNIA REPAIR    . RADICAL ORCHIECTOMY      Family History  Problem Relation Age of Onset  . Heart disease Mother   . Heart attack Father   . Heart disease Brother     Social History   Social History  . Marital status: Married    Spouse name: N/A  . Number of children: N/A  . Years of education: N/A   Occupational History  . Not on file.   Social History Main Topics  . Smoking status: Former Research scientist (life sciences)  . Smokeless tobacco: Never Used  . Alcohol use No  . Drug use: No  . Sexual activity: Yes    Birth control/ protection: None   Other Topics Concern  . Not on file   Social History Narrative  . No narrative on file    No Known Allergies  Outpatient Medications Prior to Visit  Medication Sig Dispense Refill  . aspirin 81 MG tablet Take 1 tablet by mouth daily.    Marland Kitchen CIALIS 20 MG tablet TAKE 1 TABLET BY MOUTH EVERY DAY 6 tablet 2  . ibuprofen (ADVIL,MOTRIN) 200 MG tablet Take 200 mg by mouth as needed.    . loratadine (CLARITIN) 10 MG tablet TAKE 1 TABLET (10 MG TOTAL) BY MOUTH DAILY. 30 tablet 6  . Multiple Vitamins-Minerals (CENTRUM SILVER ADULT 50+  PO) Take 1 tablet by mouth daily.    . Omega-3 Fatty Acids (FISH OIL BURP-LESS) 1000 MG CAPS Take 1 capsule by mouth daily. 90 capsule 3  . pregabalin (LYRICA) 150 MG capsule Take 1 capsule (150 mg total) by mouth 2 (two) times daily. 60 capsule 6  . traMADol (ULTRAM) 50 MG tablet Take 1 tablet (50 mg total) by mouth every 12 (twelve) hours as needed. 60 tablet 0  . DULoxetine (CYMBALTA) 30 MG capsule TAKE 1 CAPSULE (30 MG TOTAL) BY MOUTH DAILY. 30 capsule 1  . hydrochlorothiazide (MICROZIDE) 12.5 MG capsule Take 1 capsule (12.5 mg total) by mouth  daily. 30 capsule 6  . lisinopril (PRINIVIL,ZESTRIL) 20 MG tablet TAKE 1 TABLET (20 MG TOTAL) BY MOUTH DAILY. 90 tablet 0  . metoprolol (LOPRESSOR) 50 MG tablet TAKE 1 TABLET (50 MG TOTAL) BY MOUTH 2 (TWO) TIMES DAILY. 180 tablet 1  . hydrochlorothiazide (MICROZIDE) 12.5 MG capsule TAKE ONE CAPSULE BY MOUTH EVERY DAY (Patient not taking: Reported on 06/27/2016) 30 capsule 0  . metoprolol (LOPRESSOR) 50 MG tablet TAKE 1 TABLET (50 MG TOTAL) BY MOUTH 2 (TWO) TIMES DAILY. (Patient not taking: Reported on 06/27/2016) 180 tablet 0   No facility-administered medications prior to visit.     Review of Systems  Constitutional: Negative for appetite change, chills, fatigue, fever, malaise/fatigue and weight loss.  HENT: Negative for ear discharge, ear pain and sore throat.   Eyes: Negative for blurred vision.  Respiratory: Negative for cough, sputum production, shortness of breath and wheezing.   Cardiovascular: Negative for chest pain, palpitations, orthopnea, leg swelling and PND.  Gastrointestinal: Negative for abdominal pain, blood in stool, constipation, diarrhea, heartburn, melena and nausea.  Genitourinary: Negative for dysuria, frequency, hematuria and urgency.  Musculoskeletal: Negative for back pain, joint pain, myalgias and neck pain.  Skin: Negative for rash.  Neurological: Negative for dizziness, tingling, sensory change, focal weakness and headaches.  Endo/Heme/Allergies: Negative for environmental allergies and polydipsia. Does not bruise/bleed easily.  Psychiatric/Behavioral: Positive for depression. Negative for decreased concentration and suicidal ideas. The patient is not nervous/anxious and does not have insomnia.      Objective  Vitals:   06/27/16 1018  BP: 130/70  Pulse: 68  Weight: 223 lb (101.2 kg)  Height: 5\' 10"  (1.778 m)    Physical Exam  Constitutional: He is oriented to person, place, and time and well-developed, well-nourished, and in no distress. He is not  irritable.  HENT:  Head: Normocephalic.  Right Ear: External ear normal.  Left Ear: External ear normal.  Nose: Nose normal.  Mouth/Throat: Oropharynx is clear and moist.  Eyes: Conjunctivae and EOM are normal. Pupils are equal, round, and reactive to light. Right eye exhibits no discharge. Left eye exhibits no discharge. No scleral icterus.  Neck: Normal range of motion. Neck supple. No JVD present. No tracheal deviation present. No thyromegaly present.  Cardiovascular: Normal rate, regular rhythm, normal heart sounds and intact distal pulses.  Exam reveals no gallop and no friction rub.   No murmur heard. Pulmonary/Chest: Breath sounds normal. No respiratory distress. He has no wheezes. He has no rales.  Abdominal: Soft. Bowel sounds are normal. He exhibits no mass. There is no hepatosplenomegaly. There is no tenderness. There is no rebound, no guarding and no CVA tenderness.  Musculoskeletal: Normal range of motion. He exhibits no edema or tenderness.  Lymphadenopathy:    He has no cervical adenopathy.  Neurological: He is alert and oriented to person, place, and time.  He has normal sensation, normal strength, normal reflexes and intact cranial nerves. No cranial nerve deficit.  Skin: Skin is warm. No rash noted.  Psychiatric: Mood and affect normal.  Nursing note and vitals reviewed.     Assessment & Plan  Problem List Items Addressed This Visit      Cardiovascular and Mediastinum   BP (high blood pressure)   Relevant Medications   metoprolol (LOPRESSOR) 50 MG tablet   lisinopril (PRINIVIL,ZESTRIL) 20 MG tablet   hydrochlorothiazide (MICROZIDE) 12.5 MG capsule   Other Relevant Orders   Renal Function Panel     Respiratory   Sleep apnea   Relevant Orders   Ambulatory referral to Sleep Studies     Nervous and Auditory   Neuropathy (HCC)   Relevant Medications   DULoxetine (CYMBALTA) 30 MG capsule     Genitourinary   Erectile dysfunction - Primary     Other    Adjustment disorder   Relevant Medications   DULoxetine (CYMBALTA) 30 MG capsule      Meds ordered this encounter  Medications  . DULoxetine (CYMBALTA) 30 MG capsule    Sig: Take 1 capsule (30 mg total) by mouth daily.    Dispense:  90 capsule    Refill:  1  . metoprolol (LOPRESSOR) 50 MG tablet    Sig: TAKE 1 TABLET (50 MG TOTAL) BY MOUTH 2 (TWO) TIMES DAILY.    Dispense:  180 tablet    Refill:  1  . lisinopril (PRINIVIL,ZESTRIL) 20 MG tablet    Sig: TAKE 1 TABLET (20 MG TOTAL) BY MOUTH DAILY.    Dispense:  90 tablet    Refill:  1  . hydrochlorothiazide (MICROZIDE) 12.5 MG capsule    Sig: Take 1 capsule (12.5 mg total) by mouth daily.    Dispense:  90 capsule    Refill:  1      Dr. Otilio Miu [facility]  06/27/16

## 2016-06-28 LAB — RENAL FUNCTION PANEL
ALBUMIN: 4.7 g/dL (ref 3.5–5.5)
BUN/Creatinine Ratio: 20 (ref 9–20)
BUN: 23 mg/dL (ref 6–24)
CALCIUM: 9.9 mg/dL (ref 8.7–10.2)
CO2: 26 mmol/L (ref 18–29)
Chloride: 99 mmol/L (ref 96–106)
Creatinine, Ser: 1.13 mg/dL (ref 0.76–1.27)
GFR calc Af Amer: 82 mL/min/{1.73_m2} (ref >59)
GFR calc non Af Amer: 71 mL/min/{1.73_m2} (ref >59)
Glucose: 99 mg/dL (ref 65–99)
PHOSPHORUS: 3.5 mg/dL (ref 2.5–4.5)
Potassium: 4.8 mmol/L (ref 3.5–5.2)
SODIUM: 140 mmol/L (ref 134–144)

## 2016-07-20 ENCOUNTER — Other Ambulatory Visit: Payer: Self-pay | Admitting: Family Medicine

## 2016-07-20 DIAGNOSIS — C801 Malignant (primary) neoplasm, unspecified: Secondary | ICD-10-CM

## 2016-07-20 DIAGNOSIS — G63 Polyneuropathy in diseases classified elsewhere: Principal | ICD-10-CM

## 2016-07-21 ENCOUNTER — Other Ambulatory Visit: Payer: Self-pay | Admitting: Family Medicine

## 2016-07-29 ENCOUNTER — Other Ambulatory Visit: Payer: Self-pay

## 2016-09-18 ENCOUNTER — Other Ambulatory Visit: Payer: Self-pay

## 2016-10-20 ENCOUNTER — Other Ambulatory Visit: Payer: Self-pay | Admitting: Family Medicine

## 2016-10-20 DIAGNOSIS — G63 Polyneuropathy in diseases classified elsewhere: Principal | ICD-10-CM

## 2016-10-20 DIAGNOSIS — C801 Malignant (primary) neoplasm, unspecified: Secondary | ICD-10-CM

## 2016-10-21 DIAGNOSIS — R911 Solitary pulmonary nodule: Secondary | ICD-10-CM | POA: Diagnosis not present

## 2016-10-21 DIAGNOSIS — R739 Hyperglycemia, unspecified: Secondary | ICD-10-CM | POA: Diagnosis not present

## 2016-10-21 DIAGNOSIS — K5790 Diverticulosis of intestine, part unspecified, without perforation or abscess without bleeding: Secondary | ICD-10-CM | POA: Diagnosis not present

## 2016-10-21 DIAGNOSIS — E278 Other specified disorders of adrenal gland: Secondary | ICD-10-CM | POA: Diagnosis not present

## 2016-10-21 DIAGNOSIS — I10 Essential (primary) hypertension: Secondary | ICD-10-CM | POA: Diagnosis not present

## 2016-10-21 DIAGNOSIS — K760 Fatty (change of) liver, not elsewhere classified: Secondary | ICD-10-CM | POA: Diagnosis not present

## 2016-10-21 DIAGNOSIS — D649 Anemia, unspecified: Secondary | ICD-10-CM | POA: Diagnosis not present

## 2016-10-21 DIAGNOSIS — C772 Secondary and unspecified malignant neoplasm of intra-abdominal lymph nodes: Secondary | ICD-10-CM | POA: Diagnosis not present

## 2016-10-21 DIAGNOSIS — G629 Polyneuropathy, unspecified: Secondary | ICD-10-CM | POA: Diagnosis not present

## 2016-10-21 DIAGNOSIS — R208 Other disturbances of skin sensation: Secondary | ICD-10-CM | POA: Diagnosis not present

## 2016-10-21 DIAGNOSIS — D4011 Neoplasm of uncertain behavior of right testis: Secondary | ICD-10-CM | POA: Diagnosis not present

## 2016-10-21 DIAGNOSIS — C6291 Malignant neoplasm of right testis, unspecified whether descended or undescended: Secondary | ICD-10-CM | POA: Diagnosis not present

## 2016-10-21 DIAGNOSIS — C6290 Malignant neoplasm of unspecified testis, unspecified whether descended or undescended: Secondary | ICD-10-CM | POA: Diagnosis not present

## 2016-10-21 DIAGNOSIS — G4733 Obstructive sleep apnea (adult) (pediatric): Secondary | ICD-10-CM | POA: Diagnosis not present

## 2016-10-21 DIAGNOSIS — R51 Headache: Secondary | ICD-10-CM | POA: Diagnosis not present

## 2016-10-21 DIAGNOSIS — N401 Enlarged prostate with lower urinary tract symptoms: Secondary | ICD-10-CM | POA: Diagnosis not present

## 2016-10-28 ENCOUNTER — Ambulatory Visit: Payer: BLUE CROSS/BLUE SHIELD | Attending: Specialist

## 2016-10-28 DIAGNOSIS — G4761 Periodic limb movement disorder: Secondary | ICD-10-CM | POA: Insufficient documentation

## 2016-10-28 DIAGNOSIS — G4733 Obstructive sleep apnea (adult) (pediatric): Secondary | ICD-10-CM | POA: Insufficient documentation

## 2016-11-06 ENCOUNTER — Ambulatory Visit: Payer: BLUE CROSS/BLUE SHIELD | Admitting: Family Medicine

## 2016-11-18 DIAGNOSIS — G4733 Obstructive sleep apnea (adult) (pediatric): Secondary | ICD-10-CM | POA: Diagnosis not present

## 2016-11-24 ENCOUNTER — Other Ambulatory Visit: Payer: Self-pay | Admitting: Family Medicine

## 2016-11-24 DIAGNOSIS — G629 Polyneuropathy, unspecified: Secondary | ICD-10-CM

## 2016-11-24 DIAGNOSIS — F4320 Adjustment disorder, unspecified: Secondary | ICD-10-CM

## 2016-12-03 ENCOUNTER — Other Ambulatory Visit: Payer: Self-pay | Admitting: Family Medicine

## 2016-12-03 DIAGNOSIS — J301 Allergic rhinitis due to pollen: Secondary | ICD-10-CM

## 2016-12-07 ENCOUNTER — Other Ambulatory Visit: Payer: Self-pay | Admitting: Family Medicine

## 2016-12-07 DIAGNOSIS — G63 Polyneuropathy in diseases classified elsewhere: Principal | ICD-10-CM

## 2016-12-07 DIAGNOSIS — C801 Malignant (primary) neoplasm, unspecified: Secondary | ICD-10-CM

## 2016-12-19 DIAGNOSIS — G4733 Obstructive sleep apnea (adult) (pediatric): Secondary | ICD-10-CM | POA: Diagnosis not present

## 2017-01-06 ENCOUNTER — Ambulatory Visit: Payer: BLUE CROSS/BLUE SHIELD | Admitting: Family Medicine

## 2017-01-08 ENCOUNTER — Ambulatory Visit (INDEPENDENT_AMBULATORY_CARE_PROVIDER_SITE_OTHER): Payer: BLUE CROSS/BLUE SHIELD | Admitting: Family Medicine

## 2017-01-08 ENCOUNTER — Encounter: Payer: Self-pay | Admitting: Family Medicine

## 2017-01-08 VITALS — BP 120/80 | HR 72 | Ht 70.0 in | Wt 222.0 lb

## 2017-01-08 DIAGNOSIS — C801 Malignant (primary) neoplasm, unspecified: Secondary | ICD-10-CM

## 2017-01-08 DIAGNOSIS — G63 Polyneuropathy in diseases classified elsewhere: Secondary | ICD-10-CM | POA: Diagnosis not present

## 2017-01-08 DIAGNOSIS — E782 Mixed hyperlipidemia: Secondary | ICD-10-CM | POA: Diagnosis not present

## 2017-01-08 DIAGNOSIS — I10 Essential (primary) hypertension: Secondary | ICD-10-CM | POA: Diagnosis not present

## 2017-01-08 DIAGNOSIS — F3341 Major depressive disorder, recurrent, in partial remission: Secondary | ICD-10-CM | POA: Diagnosis not present

## 2017-01-08 DIAGNOSIS — G629 Polyneuropathy, unspecified: Secondary | ICD-10-CM

## 2017-01-08 MED ORDER — PREGABALIN 200 MG PO CAPS: 200.0000 mg | ORAL_CAPSULE | Freq: Two times a day (BID) | ORAL | 5 refills | Status: DC

## 2017-01-08 MED ORDER — DULOXETINE HCL 60 MG PO CPEP: 60.0000 mg | ORAL_CAPSULE | Freq: Every day | ORAL | 1 refills | Status: DC

## 2017-01-08 MED ORDER — FISH OIL BURP-LESS 1000 MG PO CAPS: 1.0000 | ORAL_CAPSULE | Freq: Every day | ORAL | 3 refills | Status: DC

## 2017-01-08 MED ORDER — TRAMADOL HCL 50 MG PO TABS: 50.0000 mg | ORAL_TABLET | Freq: Two times a day (BID) | ORAL | 0 refills | Status: DC | PRN

## 2017-01-08 NOTE — Progress Notes (Signed)
Name: Jerry Harmon   MRN: [id number]    DOB: 03/21/1956   Date:01/08/2017       Progress Note  Subjective  Chief Complaint  Chief Complaint  Patient presents with  . Depression  . Hypertension  . Peripheral Neuropathy    takes Lyrica BID    Depression         The patient presents with no depression.  This is a chronic problem.  The current episode started more than 1 year ago.   The onset quality is gradual.   The problem occurs daily.  The problem has been gradually improving since onset.  Associated symptoms include no decreased concentration, no fatigue, no helplessness, no hopelessness, does not have insomnia, not irritable, no restlessness, no decreased interest, no appetite change, no body aches, no myalgias, no headaches, no indigestion, not sad and no suicidal ideas.     The symptoms are aggravated by nothing.  Past treatments include SNRIs - Serotonin and norepinephrine reuptake inhibitors.  Compliance with treatment is good.  Previous treatment provided moderate relief.   Pertinent negatives include no anxiety and no depression. Hypertension  This is a chronic problem. The current episode started more than 1 year ago. The problem has been gradually improving since onset. The problem is controlled. Pertinent negatives include no anxiety, blurred vision, chest pain, headaches, malaise/fatigue, neck pain, orthopnea, palpitations, peripheral edema, PND, shortness of breath or sweats. There are no associated agents to hypertension. Risk factors for coronary artery disease include dyslipidemia and male gender. Past treatments include diuretics, beta blockers and ACE inhibitors. The current treatment provides moderate improvement. There are no compliance problems.  There is no history of angina, kidney disease, CAD/MI, CVA, heart failure, left ventricular hypertrophy, PVD or retinopathy. There is no history of chronic renal disease, a hypertension causing med or renovascular disease.    No  problem-specific Assessment & Plan notes found for this encounter.   Past Medical History:  Diagnosis Date  . Cancer ([place])    testicular  . Erectile dysfunction   . Hyperlipidemia   . Hypertension     Past Surgical History:  Procedure Laterality Date  . HERNIA REPAIR    . RADICAL ORCHIECTOMY      Family History  Problem Relation Age of Onset  . Heart disease Mother   . Heart attack Father   . Heart disease Brother     Social History   Social History  . Marital status: Married    Spouse name: N/A  . Number of children: N/A  . Years of education: N/A   Occupational History  . Not on file.   Social History Main Topics  . Smoking status: Former Research scientist (life sciences)  . Smokeless tobacco: Never Used  . Alcohol use No  . Drug use: No  . Sexual activity: Yes    Birth control/ protection: None   Other Topics Concern  . Not on file   Social History Narrative  . No narrative on file    No Known Allergies  Outpatient Medications Prior to Visit  Medication Sig Dispense Refill  . aspirin 81 MG tablet Take 1 tablet by mouth daily.    Marland Kitchen CIALIS 20 MG tablet TAKE 1 TABLET BY MOUTH EVERY DAY 6 tablet 2  . hydrochlorothiazide (MICROZIDE) 12.5 MG capsule Take 1 capsule (12.5 mg total) by mouth daily. 90 capsule 1  . ibuprofen (ADVIL,MOTRIN) 200 MG tablet Take 200 mg by mouth as needed.    Marland Kitchen lisinopril (PRINIVIL,ZESTRIL) 20 MG tablet  TAKE 1 TABLET (20 MG TOTAL) BY MOUTH DAILY. 90 tablet 1  . loratadine (CLARITIN) 10 MG tablet TAKE 1 TABLET (10 MG TOTAL) BY MOUTH DAILY. 30 tablet 2  . metoprolol (LOPRESSOR) 50 MG tablet TAKE 1 TABLET (50 MG TOTAL) BY MOUTH 2 (TWO) TIMES DAILY. 180 tablet 1  . Multiple Vitamins-Minerals (CENTRUM SILVER ADULT 50+ PO) Take 1 tablet by mouth daily.    . DULoxetine (CYMBALTA) 30 MG capsule TAKE 1 CAPSULE (30 MG TOTAL) BY MOUTH DAILY. 90 capsule 0  . Omega-3 Fatty Acids (FISH OIL BURP-LESS) 1000 MG CAPS Take 1 capsule by mouth daily. 90 capsule 3  . pregabalin  (LYRICA) 150 MG capsule Take 1 capsule (150 mg total) by mouth 2 (two) times daily. 60 capsule 6  . hydrochlorothiazide (MICROZIDE) 12.5 MG capsule TAKE ONE CAPSULE BY MOUTH EVERY DAY 30 capsule 0  . traMADol (ULTRAM) 50 MG tablet Take 1 tablet (50 mg total) by mouth every 12 (twelve) hours as needed. 60 tablet 0   No facility-administered medications prior to visit.     Review of Systems  Constitutional: Negative for appetite change, chills, fatigue, fever, malaise/fatigue and weight loss.  HENT: Negative for ear discharge, ear pain and sore throat.   Eyes: Negative for blurred vision.  Respiratory: Negative for cough, sputum production, shortness of breath and wheezing.   Cardiovascular: Negative for chest pain, palpitations, orthopnea, leg swelling and PND.  Gastrointestinal: Negative for abdominal pain, blood in stool, constipation, diarrhea, heartburn, melena and nausea.  Genitourinary: Negative for dysuria, frequency, hematuria and urgency.  Musculoskeletal: Negative for back pain, joint pain, myalgias and neck pain.  Skin: Negative for rash.  Neurological: Negative for dizziness, tingling, sensory change, focal weakness and headaches.  Endo/Heme/Allergies: Negative for environmental allergies and polydipsia. Does not bruise/bleed easily.  Psychiatric/Behavioral: Positive for depression. Negative for decreased concentration and suicidal ideas. The patient is not nervous/anxious and does not have insomnia.      Objective  Vitals:   01/08/17 1337  BP: 120/80  Pulse: 72  Weight: 222 lb (100.7 kg)  Height: 5\' 10"  (1.778 m)    Physical Exam  Constitutional: He is oriented to person, place, and time and well-developed, well-nourished, and in no distress. He is not irritable.  HENT:  Head: Normocephalic.  Right Ear: External ear normal.  Left Ear: External ear normal.  Nose: Nose normal.  Mouth/Throat: Oropharynx is clear and moist.  Eyes: Pupils are equal, round, and reactive  to light. Conjunctivae and EOM are normal. Right eye exhibits no discharge. Left eye exhibits no discharge. No scleral icterus.  Neck: Normal range of motion. Neck supple. No JVD present. No tracheal deviation present. No thyromegaly present.  Cardiovascular: Normal rate, regular rhythm, normal heart sounds and intact distal pulses.  Exam reveals no gallop and no friction rub.   No murmur heard. Pulmonary/Chest: Breath sounds normal. No respiratory distress. He has no wheezes. He has no rales.  Abdominal: Soft. Bowel sounds are normal. He exhibits no mass. There is no hepatosplenomegaly. There is no tenderness. There is no rebound, no guarding and no CVA tenderness.  Musculoskeletal: Normal range of motion. He exhibits no edema or tenderness.  Lymphadenopathy:    He has no cervical adenopathy.  Neurological: He is alert and oriented to person, place, and time. He has normal sensation, normal strength, normal reflexes and intact cranial nerves. No cranial nerve deficit.  Skin: Skin is warm. No rash noted.  Psychiatric: Mood and affect normal.  Nursing note  and vitals reviewed.     Assessment & Plan  Problem List Items Addressed This Visit      Cardiovascular and Mediastinum   BP (high blood pressure) - Primary     Nervous and Auditory   Neuropathy   Relevant Medications   DULoxetine (CYMBALTA) 60 MG capsule   pregabalin (LYRICA) 200 MG capsule   traMADol (ULTRAM) 50 MG tablet    Other Visit Diagnoses    Mixed hyperlipidemia       Relevant Medications   Omega-3 Fatty Acids (FISH OIL BURP-LESS) 1000 MG CAPS   Recurrent major depressive disorder, in partial remission (HCC)       Relevant Medications   DULoxetine (CYMBALTA) 60 MG capsule   Neuropathy associated with cancer (HCC)       Relevant Medications   traMADol (ULTRAM) 50 MG tablet      Meds ordered this encounter  Medications  . DULoxetine (CYMBALTA) 60 MG capsule    Sig: Take 1 capsule (60 mg total) by mouth daily.     Dispense:  90 capsule    Refill:  1  . pregabalin (LYRICA) 200 MG capsule    Sig: Take 1 capsule (200 mg total) by mouth 2 (two) times daily.    Dispense:  60 capsule    Refill:  5  . Omega-3 Fatty Acids (FISH OIL BURP-LESS) 1000 MG CAPS    Sig: Take 1 capsule by mouth daily.    Dispense:  90 capsule    Refill:  3  . traMADol (ULTRAM) 50 MG tablet    Sig: Take 1 tablet (50 mg total) by mouth every 12 (twelve) hours as needed for moderate pain.    Dispense:  60 tablet    Refill:  0      Dr. Otilio Miu [facility]  01/08/17

## 2017-01-15 ENCOUNTER — Other Ambulatory Visit: Payer: Self-pay | Admitting: Family Medicine

## 2017-01-15 DIAGNOSIS — I10 Essential (primary) hypertension: Secondary | ICD-10-CM

## 2017-01-18 DIAGNOSIS — G4733 Obstructive sleep apnea (adult) (pediatric): Secondary | ICD-10-CM | POA: Diagnosis not present

## 2017-02-18 DIAGNOSIS — G4733 Obstructive sleep apnea (adult) (pediatric): Secondary | ICD-10-CM | POA: Diagnosis not present

## 2017-03-02 DIAGNOSIS — G4733 Obstructive sleep apnea (adult) (pediatric): Secondary | ICD-10-CM | POA: Diagnosis not present

## 2017-03-13 ENCOUNTER — Ambulatory Visit (INDEPENDENT_AMBULATORY_CARE_PROVIDER_SITE_OTHER): Payer: BLUE CROSS/BLUE SHIELD | Admitting: Family Medicine

## 2017-03-13 ENCOUNTER — Encounter: Payer: Self-pay | Admitting: Family Medicine

## 2017-03-13 DIAGNOSIS — G63 Polyneuropathy in diseases classified elsewhere: Secondary | ICD-10-CM | POA: Diagnosis not present

## 2017-03-13 DIAGNOSIS — C801 Malignant (primary) neoplasm, unspecified: Secondary | ICD-10-CM

## 2017-03-13 DIAGNOSIS — G629 Polyneuropathy, unspecified: Secondary | ICD-10-CM | POA: Diagnosis not present

## 2017-03-13 MED ORDER — PREGABALIN 200 MG PO CAPS
200.0000 mg | ORAL_CAPSULE | Freq: Two times a day (BID) | ORAL | 5 refills | Status: DC
Start: 2017-03-13 — End: 2018-06-08

## 2017-03-13 MED ORDER — DULOXETINE HCL 60 MG PO CPEP: 60.0000 mg | ORAL_CAPSULE | Freq: Every day | ORAL | 1 refills | Status: DC

## 2017-03-13 MED ORDER — TRAMADOL HCL 50 MG PO TABS: 50.0000 mg | ORAL_TABLET | Freq: Two times a day (BID) | ORAL | 2 refills | Status: DC | PRN

## 2017-03-13 NOTE — Progress Notes (Signed)
Name: Jerry Harmon   MRN: [id number]    DOB: [birth year]   Date:03/13/2017       Progress Note  Subjective  Chief Complaint  Chief Complaint  Patient presents with  . Peripheral Neuropathy    doubled duloxetine at last visit/ follow up- "can't really tell a difference"    Sinusitis  This is a new problem. The current episode started in the past 7 days. The problem has been gradually worsening since onset. There has been no fever. The pain is moderate. Associated symptoms include chills, congestion, coughing, headaches and sinus pressure. Pertinent negatives include no diaphoresis, ear pain, hoarse voice, neck pain, shortness of breath, sneezing, sore throat or swollen glands. Past treatments include acetaminophen. The treatment provided moderate relief.  Neurologic Problem  The patient's pertinent negatives include no altered mental status, clumsiness, focal sensory loss, focal weakness, loss of balance, memory loss, near-syncope, slurred speech, syncope, visual change or weakness. This is a chronic problem. The current episode started in the past 7 days. There was no focality noted. Associated symptoms include headaches. Pertinent negatives include no abdominal pain, auditory change, aura, back pain, bladder incontinence, bowel incontinence, chest pain, confusion, diaphoresis, dizziness, fatigue, fever, light-headedness, nausea, neck pain, palpitations, shortness of breath, vertigo or vomiting. Past treatments include nothing. The treatment provided mild relief. There is no history of a bleeding disorder, a clotting disorder, a CVA, dementia, head trauma, liver disease, mood changes or seizures.    No problem-specific Assessment & Plan notes found for this encounter.   Past Medical History:  Diagnosis Date  . Cancer ([place])    testicular  . Erectile dysfunction   . Hyperlipidemia   . Hypertension     Past Surgical History:  Procedure Laterality Date  . HERNIA REPAIR    . RADICAL  ORCHIECTOMY      Family History  Problem Relation Age of Onset  . Heart disease Mother   . Heart attack Father   . Heart disease Brother     Social History   Socioeconomic History  . Marital status: Married    Spouse name: Not on file  . Number of children: Not on file  . Years of education: Not on file  . Highest education level: Not on file  Social Needs  . Financial resource strain: Not on file  . Food insecurity - worry: Not on file  . Food insecurity - inability: Not on file  . Transportation needs - medical: Not on file  . Transportation needs - non-medical: Not on file  Occupational History  . Not on file  Tobacco Use  . Smoking status: Former Research scientist (life sciences)  . Smokeless tobacco: Never Used  Substance and Sexual Activity  . Alcohol use: No    Alcohol/week: 0.0 oz  . Drug use: No  . Sexual activity: Yes    Birth control/protection: None  Other Topics Concern  . Not on file  Social History Narrative  . Not on file    No Known Allergies  Outpatient Medications Prior to Visit  Medication Sig Dispense Refill  . aspirin 81 MG tablet Take 1 tablet by mouth daily.    Marland Kitchen CIALIS 20 MG tablet TAKE 1 TABLET BY MOUTH EVERY DAY 6 tablet 2  . hydrochlorothiazide (MICROZIDE) 12.5 MG capsule TAKE 1 CAPSULE (12.5 MG TOTAL) BY MOUTH DAILY. 90 capsule 1  . ibuprofen (ADVIL,MOTRIN) 200 MG tablet Take 200 mg by mouth as needed.    Marland Kitchen lisinopril (PRINIVIL,ZESTRIL) 20 MG tablet TAKE 1 TABLET (  20 MG TOTAL) BY MOUTH DAILY. 90 tablet 1  . loratadine (CLARITIN) 10 MG tablet TAKE 1 TABLET (10 MG TOTAL) BY MOUTH DAILY. 30 tablet 2  . metoprolol tartrate (LOPRESSOR) 50 MG tablet TAKE 1 TABLET (50 MG TOTAL) BY MOUTH 2 (TWO) TIMES DAILY. 180 tablet 1  . Multiple Vitamins-Minerals (CENTRUM SILVER ADULT 50+ PO) Take 1 tablet by mouth daily.    . Omega-3 Fatty Acids (FISH OIL BURP-LESS) 1000 MG CAPS Take 1 capsule by mouth daily. 90 capsule 3  . DULoxetine (CYMBALTA) 60 MG capsule Take 1 capsule (60  mg total) by mouth daily. 90 capsule 1  . pregabalin (LYRICA) 200 MG capsule Take 1 capsule (200 mg total) by mouth 2 (two) times daily. 60 capsule 5  . traMADol (ULTRAM) 50 MG tablet Take 1 tablet (50 mg total) by mouth every 12 (twelve) hours as needed for moderate pain. 60 tablet 0   No facility-administered medications prior to visit.     Review of Systems  Constitutional: Positive for chills. Negative for diaphoresis, fatigue, fever, malaise/fatigue and weight loss.  HENT: Positive for congestion and sinus pressure. Negative for ear discharge, ear pain, hoarse voice, sneezing and sore throat.   Eyes: Negative for blurred vision.  Respiratory: Positive for cough. Negative for sputum production, shortness of breath and wheezing.   Cardiovascular: Negative for chest pain, palpitations, leg swelling and near-syncope.  Gastrointestinal: Negative for abdominal pain, blood in stool, bowel incontinence, constipation, diarrhea, heartburn, melena, nausea and vomiting.  Genitourinary: Negative for bladder incontinence, dysuria, frequency, hematuria and urgency.  Musculoskeletal: Negative for back pain, joint pain, myalgias and neck pain.  Skin: Negative for rash.  Neurological: Positive for headaches. Negative for dizziness, vertigo, tingling, sensory change, focal weakness, syncope, weakness, light-headedness and loss of balance.  Endo/Heme/Allergies: Negative for environmental allergies and polydipsia. Does not bruise/bleed easily.  Psychiatric/Behavioral: Negative for confusion, depression, memory loss and suicidal ideas. The patient is not nervous/anxious and does not have insomnia.      Objective  Vitals:   03/13/17 1556  BP: 138/84  Pulse: 100  Weight: 217 lb (98.4 kg)  Height: 5\' 10"  (1.778 m)    Physical Exam  Constitutional: He is oriented to person, place, and time and well-developed, well-nourished, and in no distress.  HENT:  Head: Normocephalic.  Right Ear: External ear  normal.  Left Ear: External ear normal.  Nose: Nose normal.  Mouth/Throat: Oropharynx is clear and moist.  Eyes: Conjunctivae and EOM are normal. Pupils are equal, round, and reactive to light. Right eye exhibits no discharge. Left eye exhibits no discharge. No scleral icterus.  Neck: Normal range of motion. Neck supple. No JVD present. No tracheal deviation present. No thyromegaly present.  Cardiovascular: Normal rate, regular rhythm, normal heart sounds and intact distal pulses. Exam reveals no gallop and no friction rub.  No murmur heard. Pulmonary/Chest: Breath sounds normal. No respiratory distress. He has no wheezes. He has no rales.  Abdominal: Soft. Bowel sounds are normal. He exhibits no mass. There is no hepatosplenomegaly. There is no tenderness. There is no rebound, no guarding and no CVA tenderness.  Musculoskeletal: Normal range of motion. He exhibits no edema or tenderness.  Lymphadenopathy:    He has no cervical adenopathy.  Neurological: He is alert and oriented to person, place, and time. He has normal sensation, normal strength, normal reflexes and intact cranial nerves. No cranial nerve deficit.  Skin: Skin is warm. No rash noted.  Psychiatric: Mood and affect normal.  Nursing note and vitals reviewed.     Assessment & Plan  Problem List Items Addressed This Visit      Nervous and Auditory   Neuropathy   Relevant Medications   DULoxetine (CYMBALTA) 60 MG capsule   traMADol (ULTRAM) 50 MG tablet   pregabalin (LYRICA) 200 MG capsule    Other Visit Diagnoses    Neuropathy associated with cancer ([place])       Relevant Medications   traMADol (ULTRAM) 50 MG tablet      Meds ordered this encounter  Medications  . DULoxetine (CYMBALTA) 60 MG capsule    Sig: Take 1 capsule (60 mg total) by mouth daily.    Dispense:  90 capsule    Refill:  1  . traMADol (ULTRAM) 50 MG tablet    Sig: Take 1 tablet (50 mg total) by mouth every 12 (twelve) hours as needed for  moderate pain.    Dispense:  60 tablet    Refill:  2  . pregabalin (LYRICA) 200 MG capsule    Sig: Take 1 capsule (200 mg total) by mouth 2 (two) times daily.    Dispense:  60 capsule    Refill:  5      Dr. Otilio Miu [facility]  03/13/17

## 2017-03-16 ENCOUNTER — Ambulatory Visit: Payer: BLUE CROSS/BLUE SHIELD | Admitting: Family Medicine

## 2017-03-17 ENCOUNTER — Telehealth: Payer: Self-pay

## 2017-03-17 ENCOUNTER — Other Ambulatory Visit: Payer: Self-pay

## 2017-03-17 MED ORDER — AMOXICILLIN 500 MG PO CAPS: 500.0000 mg | ORAL_CAPSULE | Freq: Three times a day (TID) | ORAL | 0 refills | Status: DC

## 2017-03-17 MED ORDER — BENZONATATE 100 MG PO CAPS: 100.0000 mg | ORAL_CAPSULE | Freq: Two times a day (BID) | ORAL | 0 refills | Status: DC | PRN

## 2017-03-17 NOTE — Telephone Encounter (Signed)
Pt called c/o cough and cong- sent in tessalon perles and amoxil to [facility]

## 2017-03-20 DIAGNOSIS — G4733 Obstructive sleep apnea (adult) (pediatric): Secondary | ICD-10-CM | POA: Diagnosis not present

## 2017-04-16 ENCOUNTER — Other Ambulatory Visit: Payer: Self-pay

## 2017-04-20 DIAGNOSIS — G4733 Obstructive sleep apnea (adult) (pediatric): Secondary | ICD-10-CM | POA: Diagnosis not present

## 2017-04-23 DIAGNOSIS — Z8547 Personal history of malignant neoplasm of testis: Secondary | ICD-10-CM | POA: Diagnosis not present

## 2017-04-23 DIAGNOSIS — G4733 Obstructive sleep apnea (adult) (pediatric): Secondary | ICD-10-CM | POA: Diagnosis not present

## 2017-04-23 DIAGNOSIS — D649 Anemia, unspecified: Secondary | ICD-10-CM | POA: Diagnosis not present

## 2017-04-23 DIAGNOSIS — Z452 Encounter for adjustment and management of vascular access device: Secondary | ICD-10-CM | POA: Diagnosis not present

## 2017-04-23 DIAGNOSIS — C6290 Malignant neoplasm of unspecified testis, unspecified whether descended or undescended: Secondary | ICD-10-CM | POA: Diagnosis not present

## 2017-04-23 DIAGNOSIS — G629 Polyneuropathy, unspecified: Secondary | ICD-10-CM | POA: Diagnosis not present

## 2017-04-23 DIAGNOSIS — Z08 Encounter for follow-up examination after completed treatment for malignant neoplasm: Secondary | ICD-10-CM | POA: Diagnosis not present

## 2017-04-23 DIAGNOSIS — Z9221 Personal history of antineoplastic chemotherapy: Secondary | ICD-10-CM | POA: Diagnosis not present

## 2017-04-23 DIAGNOSIS — R739 Hyperglycemia, unspecified: Secondary | ICD-10-CM | POA: Diagnosis not present

## 2017-04-23 DIAGNOSIS — R5383 Other fatigue: Secondary | ICD-10-CM | POA: Diagnosis not present

## 2017-04-23 DIAGNOSIS — Z86010 Personal history of colonic polyps: Secondary | ICD-10-CM | POA: Diagnosis not present

## 2017-04-23 DIAGNOSIS — Z9989 Dependence on other enabling machines and devices: Secondary | ICD-10-CM | POA: Diagnosis not present

## 2017-04-23 DIAGNOSIS — E278 Other specified disorders of adrenal gland: Secondary | ICD-10-CM | POA: Diagnosis not present

## 2017-04-23 DIAGNOSIS — I10 Essential (primary) hypertension: Secondary | ICD-10-CM | POA: Diagnosis not present

## 2017-04-23 DIAGNOSIS — Z6832 Body mass index (BMI) 32.0-32.9, adult: Secondary | ICD-10-CM | POA: Diagnosis not present

## 2017-04-23 DIAGNOSIS — K5730 Diverticulosis of large intestine without perforation or abscess without bleeding: Secondary | ICD-10-CM | POA: Diagnosis not present

## 2017-04-23 DIAGNOSIS — R918 Other nonspecific abnormal finding of lung field: Secondary | ICD-10-CM | POA: Diagnosis not present

## 2017-04-23 DIAGNOSIS — K4090 Unilateral inguinal hernia, without obstruction or gangrene, not specified as recurrent: Secondary | ICD-10-CM | POA: Diagnosis not present

## 2017-04-23 DIAGNOSIS — R51 Headache: Secondary | ICD-10-CM | POA: Diagnosis not present

## 2017-04-23 DIAGNOSIS — R0602 Shortness of breath: Secondary | ICD-10-CM | POA: Diagnosis not present

## 2017-04-23 DIAGNOSIS — Z8719 Personal history of other diseases of the digestive system: Secondary | ICD-10-CM | POA: Diagnosis not present

## 2017-04-23 DIAGNOSIS — Z9079 Acquired absence of other genital organ(s): Secondary | ICD-10-CM | POA: Diagnosis not present

## 2017-05-13 ENCOUNTER — Ambulatory Visit (INDEPENDENT_AMBULATORY_CARE_PROVIDER_SITE_OTHER): Payer: BLUE CROSS/BLUE SHIELD | Admitting: Family Medicine

## 2017-05-13 ENCOUNTER — Encounter: Payer: Self-pay | Admitting: Family Medicine

## 2017-05-13 VITALS — BP 122/80 | HR 88 | Temp 98.3°F | Resp 12 | Ht 72.0 in | Wt 225.0 lb

## 2017-05-13 DIAGNOSIS — J40 Bronchitis, not specified as acute or chronic: Secondary | ICD-10-CM | POA: Diagnosis not present

## 2017-05-13 DIAGNOSIS — J0100 Acute maxillary sinusitis, unspecified: Secondary | ICD-10-CM

## 2017-05-13 MED ORDER — GUAIFENESIN-CODEINE 100-10 MG/5ML PO SYRP: 5.0000 mL | ORAL_SOLUTION | Freq: Three times a day (TID) | ORAL | 0 refills | Status: DC | PRN

## 2017-05-13 MED ORDER — AMOXICILLIN-POT CLAVULANATE 875-125 MG PO TABS
1.0000 | ORAL_TABLET | Freq: Two times a day (BID) | ORAL | 0 refills | Status: DC
Start: 2017-05-13 — End: 2017-09-09

## 2017-05-13 NOTE — Progress Notes (Signed)
Name: Jerry Harmon   MRN: [id number]    DOB: 23-Oct-1955   Date:05/13/2017       Progress Note  Subjective  Chief Complaint  No chief complaint on file.   Sinusitis  This is a new problem. The current episode started in the past 7 days. The problem has been waxing and waning since onset. There has been no fever. The pain is mild. Associated symptoms include congestion, headaches, sinus pressure, sneezing and a sore throat. Pertinent negatives include no chills, coughing, diaphoresis, ear pain, hoarse voice, neck pain or shortness of breath. Past treatments include nothing. The treatment provided moderate relief.  Cough  This is a chronic problem. The current episode started in the past 7 days. The problem occurs every few hours. The cough is productive of purulent sputum. Associated symptoms include headaches and a sore throat. Pertinent negatives include no chest pain, chills, ear congestion, ear pain, fever, heartburn, hemoptysis, myalgias, nasal congestion, postnasal drip, rash, rhinorrhea, shortness of breath, sweats, weight loss or wheezing. Nothing aggravates the symptoms. The treatment provided moderate relief. There is no history of asthma, bronchiectasis, bronchitis, COPD, emphysema, environmental allergies or pneumonia.    No problem-specific Assessment & Plan notes found for this encounter.   Past Medical History:  Diagnosis Date  . Cancer ([place])    testicular  . Erectile dysfunction   . Hyperlipidemia   . Hypertension     Past Surgical History:  Procedure Laterality Date  . HERNIA REPAIR    . RADICAL ORCHIECTOMY      Family History  Problem Relation Age of Onset  . Heart disease Mother   . Heart attack Father   . Heart disease Brother     Social History   Socioeconomic History  . Marital status: Married    Spouse name: Not on file  . Number of children: Not on file  . Years of education: Not on file  . Highest education level: Not on file  Social Needs  .  Financial resource strain: Not on file  . Food insecurity - worry: Not on file  . Food insecurity - inability: Not on file  . Transportation needs - medical: Not on file  . Transportation needs - non-medical: Not on file  Occupational History  . Not on file  Tobacco Use  . Smoking status: Former Research scientist (life sciences)  . Smokeless tobacco: Never Used  Substance and Sexual Activity  . Alcohol use: No    Alcohol/week: 0.0 oz  . Drug use: No  . Sexual activity: Yes    Birth control/protection: None  Other Topics Concern  . Not on file  Social History Narrative  . Not on file    No Known Allergies  Outpatient Medications Prior to Visit  Medication Sig Dispense Refill  . amoxicillin (AMOXIL) 500 MG capsule Take 1 capsule (500 mg total) by mouth 3 (three) times daily. 30 capsule 0  . aspirin 81 MG tablet Take 1 tablet by mouth daily.    . benzonatate (TESSALON) 100 MG capsule Take 1 capsule (100 mg total) by mouth 2 (two) times daily as needed for cough. 20 capsule 0  . CIALIS 20 MG tablet TAKE 1 TABLET BY MOUTH EVERY DAY 6 tablet 2  . DULoxetine (CYMBALTA) 60 MG capsule Take 1 capsule (60 mg total) by mouth daily. 90 capsule 1  . hydrochlorothiazide (MICROZIDE) 12.5 MG capsule TAKE 1 CAPSULE (12.5 MG TOTAL) BY MOUTH DAILY. 90 capsule 1  . ibuprofen (ADVIL,MOTRIN) 200 MG tablet Take 200  mg by mouth as needed.    Marland Kitchen lisinopril (PRINIVIL,ZESTRIL) 20 MG tablet TAKE 1 TABLET (20 MG TOTAL) BY MOUTH DAILY. 90 tablet 1  . loratadine (CLARITIN) 10 MG tablet TAKE 1 TABLET (10 MG TOTAL) BY MOUTH DAILY. 30 tablet 2  . metoprolol tartrate (LOPRESSOR) 50 MG tablet TAKE 1 TABLET (50 MG TOTAL) BY MOUTH 2 (TWO) TIMES DAILY. 180 tablet 1  . Multiple Vitamins-Minerals (CENTRUM SILVER ADULT 50+ PO) Take 1 tablet by mouth daily.    . Omega-3 Fatty Acids (FISH OIL BURP-LESS) 1000 MG CAPS Take 1 capsule by mouth daily. 90 capsule 3  . pregabalin (LYRICA) 200 MG capsule Take 1 capsule (200 mg total) by mouth 2 (two) times  daily. 60 capsule 5  . traMADol (ULTRAM) 50 MG tablet Take 1 tablet (50 mg total) by mouth every 12 (twelve) hours as needed for moderate pain. 60 tablet 2   No facility-administered medications prior to visit.     Review of Systems  Constitutional: Negative for chills, diaphoresis, fever, malaise/fatigue and weight loss.  HENT: Positive for congestion, sinus pressure, sneezing and sore throat. Negative for ear discharge, ear pain, hoarse voice, postnasal drip and rhinorrhea.   Eyes: Negative for blurred vision.  Respiratory: Negative for cough, hemoptysis, sputum production, shortness of breath and wheezing.   Cardiovascular: Negative for chest pain, palpitations and leg swelling.  Gastrointestinal: Negative for abdominal pain, blood in stool, constipation, diarrhea, heartburn, melena and nausea.  Genitourinary: Negative for dysuria, frequency, hematuria and urgency.  Musculoskeletal: Negative for back pain, joint pain, myalgias and neck pain.  Skin: Negative for rash.  Neurological: Positive for headaches. Negative for dizziness, tingling, sensory change and focal weakness.  Endo/Heme/Allergies: Negative for environmental allergies and polydipsia. Does not bruise/bleed easily.  Psychiatric/Behavioral: Negative for depression and suicidal ideas. The patient is not nervous/anxious and does not have insomnia.      Objective  Vitals:   05/13/17 1008  BP: 122/80  Pulse: 88  Resp: 12  Temp: 98.3 F (36.8 C)  TempSrc: Oral  Weight: 225 lb (102.1 kg)  Height: 6' (1.829 m)    Physical Exam  Constitutional: He is oriented to person, place, and time and well-developed, well-nourished, and in no distress.  HENT:  Head: Normocephalic.  Right Ear: External ear normal.  Left Ear: External ear normal.  Nose: Nose normal.  Mouth/Throat: Oropharynx is clear and moist.  Eyes: Conjunctivae and EOM are normal. Pupils are equal, round, and reactive to light. Right eye exhibits no discharge.  Left eye exhibits no discharge. No scleral icterus.  Neck: Normal range of motion. Neck supple. No JVD present. No tracheal deviation present. No thyromegaly present.  Cardiovascular: Normal rate, regular rhythm, normal heart sounds and intact distal pulses. Exam reveals no gallop and no friction rub.  No murmur heard. Pulmonary/Chest: Breath sounds normal. No respiratory distress. He has no wheezes. He has no rales.  Abdominal: Soft. Bowel sounds are normal. He exhibits no mass. There is no hepatosplenomegaly. There is no tenderness. There is no rebound, no guarding and no CVA tenderness.  Musculoskeletal: Normal range of motion. He exhibits no edema or tenderness.  Lymphadenopathy:    He has no cervical adenopathy.  Neurological: He is alert and oriented to person, place, and time. He has normal sensation, normal strength, normal reflexes and intact cranial nerves. No cranial nerve deficit.  Skin: Skin is warm. No rash noted.  Psychiatric: Mood and affect normal.  Nursing note and vitals reviewed.  Assessment & Plan  Problem List Items Addressed This Visit    None    Visit Diagnoses    Acute maxillary sinusitis, recurrence not specified    -  Primary   Relevant Medications   guaiFENesin-codeine (ROBITUSSIN AC) 100-10 MG/5ML syrup   amoxicillin-clavulanate (AUGMENTIN) 875-125 MG tablet   Bronchitis       Relevant Medications   guaiFENesin-codeine (ROBITUSSIN AC) 100-10 MG/5ML syrup      Meds ordered this encounter  Medications  . guaiFENesin-codeine (ROBITUSSIN AC) 100-10 MG/5ML syrup    Sig: Take 5 mLs by mouth 3 (three) times daily as needed for cough.    Dispense:  150 mL    Refill:  0  . amoxicillin-clavulanate (AUGMENTIN) 875-125 MG tablet    Sig: Take 1 tablet by mouth 2 (two) times daily.    Dispense:  20 tablet    Refill:  0      Dr. Otilio Miu [facility]  05/13/17

## 2017-05-21 DIAGNOSIS — G4733 Obstructive sleep apnea (adult) (pediatric): Secondary | ICD-10-CM | POA: Diagnosis not present

## 2017-06-02 DIAGNOSIS — G4733 Obstructive sleep apnea (adult) (pediatric): Secondary | ICD-10-CM | POA: Diagnosis not present

## 2017-06-18 DIAGNOSIS — G4733 Obstructive sleep apnea (adult) (pediatric): Secondary | ICD-10-CM | POA: Diagnosis not present

## 2017-07-19 DIAGNOSIS — G4733 Obstructive sleep apnea (adult) (pediatric): Secondary | ICD-10-CM | POA: Diagnosis not present

## 2017-07-23 ENCOUNTER — Other Ambulatory Visit: Payer: Self-pay | Admitting: Family Medicine

## 2017-07-23 DIAGNOSIS — I10 Essential (primary) hypertension: Secondary | ICD-10-CM

## 2017-08-18 DIAGNOSIS — G4733 Obstructive sleep apnea (adult) (pediatric): Secondary | ICD-10-CM | POA: Diagnosis not present

## 2017-08-19 ENCOUNTER — Other Ambulatory Visit: Payer: Self-pay | Admitting: Family Medicine

## 2017-08-19 DIAGNOSIS — I10 Essential (primary) hypertension: Secondary | ICD-10-CM

## 2017-08-21 DIAGNOSIS — Z9079 Acquired absence of other genital organ(s): Secondary | ICD-10-CM | POA: Diagnosis not present

## 2017-08-21 DIAGNOSIS — E291 Testicular hypofunction: Secondary | ICD-10-CM | POA: Diagnosis not present

## 2017-08-21 DIAGNOSIS — Z9989 Dependence on other enabling machines and devices: Secondary | ICD-10-CM | POA: Diagnosis not present

## 2017-08-21 DIAGNOSIS — R6882 Decreased libido: Secondary | ICD-10-CM | POA: Diagnosis not present

## 2017-08-21 DIAGNOSIS — Z7982 Long term (current) use of aspirin: Secondary | ICD-10-CM | POA: Diagnosis not present

## 2017-08-21 DIAGNOSIS — Z8579 Personal history of other malignant neoplasms of lymphoid, hematopoietic and related tissues: Secondary | ICD-10-CM | POA: Diagnosis not present

## 2017-08-21 DIAGNOSIS — I10 Essential (primary) hypertension: Secondary | ICD-10-CM | POA: Diagnosis not present

## 2017-08-21 DIAGNOSIS — Z9221 Personal history of antineoplastic chemotherapy: Secondary | ICD-10-CM | POA: Diagnosis not present

## 2017-08-21 DIAGNOSIS — C772 Secondary and unspecified malignant neoplasm of intra-abdominal lymph nodes: Secondary | ICD-10-CM | POA: Diagnosis not present

## 2017-08-21 DIAGNOSIS — Z6831 Body mass index (BMI) 31.0-31.9, adult: Secondary | ICD-10-CM | POA: Diagnosis not present

## 2017-08-21 DIAGNOSIS — Z79899 Other long term (current) drug therapy: Secondary | ICD-10-CM | POA: Diagnosis not present

## 2017-08-21 DIAGNOSIS — Z8547 Personal history of malignant neoplasm of testis: Secondary | ICD-10-CM | POA: Diagnosis not present

## 2017-08-21 DIAGNOSIS — C6291 Malignant neoplasm of right testis, unspecified whether descended or undescended: Secondary | ICD-10-CM | POA: Diagnosis not present

## 2017-08-21 DIAGNOSIS — G4733 Obstructive sleep apnea (adult) (pediatric): Secondary | ICD-10-CM | POA: Diagnosis not present

## 2017-08-21 DIAGNOSIS — Z87891 Personal history of nicotine dependence: Secondary | ICD-10-CM | POA: Diagnosis not present

## 2017-08-25 ENCOUNTER — Other Ambulatory Visit: Payer: Self-pay | Admitting: Family Medicine

## 2017-09-03 DIAGNOSIS — E7259 Other disorders of glycine metabolism: Secondary | ICD-10-CM | POA: Diagnosis not present

## 2017-09-09 ENCOUNTER — Encounter: Payer: Self-pay | Admitting: Family Medicine

## 2017-09-09 ENCOUNTER — Ambulatory Visit (INDEPENDENT_AMBULATORY_CARE_PROVIDER_SITE_OTHER): Payer: BLUE CROSS/BLUE SHIELD | Admitting: Family Medicine

## 2017-09-09 VITALS — BP 120/80 | HR 64 | Ht 72.0 in | Wt 229.0 lb

## 2017-09-09 DIAGNOSIS — N528 Other male erectile dysfunction: Secondary | ICD-10-CM

## 2017-09-09 DIAGNOSIS — J301 Allergic rhinitis due to pollen: Secondary | ICD-10-CM

## 2017-09-09 DIAGNOSIS — I10 Essential (primary) hypertension: Secondary | ICD-10-CM

## 2017-09-09 MED ORDER — LISINOPRIL 20 MG PO TABS: 20.0000 mg | ORAL_TABLET | Freq: Every day | ORAL | 1 refills | Status: DC

## 2017-09-09 MED ORDER — LORATADINE 10 MG PO TABS: ORAL_TABLET | ORAL | 11 refills | Status: DC

## 2017-09-09 MED ORDER — SILDENAFIL CITRATE 20 MG PO TABS: ORAL_TABLET | ORAL | 99 refills | Status: DC

## 2017-09-09 MED ORDER — METOPROLOL TARTRATE 50 MG PO TABS: 50.0000 mg | ORAL_TABLET | Freq: Two times a day (BID) | ORAL | 1 refills | Status: DC

## 2017-09-09 MED ORDER — HYDROCHLOROTHIAZIDE 12.5 MG PO CAPS: ORAL_CAPSULE | ORAL | 1 refills | Status: DC

## 2017-09-09 NOTE — Progress Notes (Signed)
Name: Jerry Harmon   MRN: [id number]    DOB: September 08, 1955   Date:09/09/2017       Progress Note  Subjective  Chief Complaint  Chief Complaint  Patient presents with  . Hypertension  . Allergic Rhinitis   . Peripheral Neuropathy  . Erectile Dysfunction    Hypertension  This is a chronic problem. The current episode started more than 1 year ago. The problem is unchanged. The problem is controlled. Pertinent negatives include no anxiety, blurred vision, chest pain, headaches, malaise/fatigue, neck pain, orthopnea, palpitations, peripheral edema, PND, shortness of breath or sweats. There are no associated agents to hypertension. Risk factors for coronary artery disease include dyslipidemia, post-menopausal state and male gender. Past treatments include ACE inhibitors, beta blockers and diuretics. The current treatment provides moderate improvement. There are no compliance problems.  There is no history of angina, kidney disease, CAD/MI, CVA, heart failure, left ventricular hypertrophy, PVD or retinopathy. There is no history of a hypertension causing med or renovascular disease.  Erectile Dysfunction  This is a chronic problem. The current episode started more than 1 year ago. The problem has been waxing and waning since onset. The nature of his difficulty is achieving erection and maintaining erection. Non-physiologic factors contributing to erectile dysfunction are a decreased libido. He reports no anxiety. Irritative symptoms do not include frequency, nocturia or urgency. Obstructive symptoms do not include dribbling, incomplete emptying, an intermittent stream, a slower stream, straining or a weak stream. Pertinent negatives include no chills, dysuria, genital pain, hematuria, hesitancy or inability to urinate.    No problem-specific Assessment & Plan notes found for this encounter.   Past Medical History:  Diagnosis Date  . Cancer ([place])    testicular  . Erectile dysfunction   .  Hyperlipidemia   . Hypertension     Past Surgical History:  Procedure Laterality Date  . HERNIA REPAIR    . RADICAL ORCHIECTOMY      Family History  Problem Relation Age of Onset  . Heart disease Mother   . Heart attack Father   . Heart disease Brother     Social History   Socioeconomic History  . Marital status: Married    Spouse name: Not on file  . Number of children: Not on file  . Years of education: Not on file  . Highest education level: Not on file  Occupational History  . Not on file  Social Needs  . Financial resource strain: Not on file  . Food insecurity:    Worry: Not on file    Inability: Not on file  . Transportation needs:    Medical: Not on file    Non-medical: Not on file  Tobacco Use  . Smoking status: Former Research scientist (life sciences)  . Smokeless tobacco: Never Used  Substance and Sexual Activity  . Alcohol use: No    Alcohol/week: 0.0 oz  . Drug use: No  . Sexual activity: Yes    Birth control/protection: None  Lifestyle  . Physical activity:    Days per week: Not on file    Minutes per session: Not on file  . Stress: Not on file  Relationships  . Social connections:    Talks on phone: Not on file    Gets together: Not on file    Attends religious service: Not on file    Active member of club or organization: Not on file    Attends meetings of clubs or organizations: Not on file    Relationship status: Not  on file  . Intimate partner violence:    Fear of current or ex partner: Not on file    Emotionally abused: Not on file    Physically abused: Not on file    Forced sexual activity: Not on file  Other Topics Concern  . Not on file  Social History Narrative  . Not on file    No Known Allergies  Outpatient Medications Prior to Visit  Medication Sig Dispense Refill  . aspirin 81 MG tablet Take 1 tablet by mouth daily.    Marland Kitchen ibuprofen (ADVIL,MOTRIN) 200 MG tablet Take 200 mg by mouth as needed.    . Multiple Vitamins-Minerals (CENTRUM SILVER  ADULT 50+ PO) Take 1 tablet by mouth daily.    . pregabalin (LYRICA) 200 MG capsule Take 1 capsule (200 mg total) by mouth 2 (two) times daily. 60 capsule 5  . traMADol (ULTRAM) 50 MG tablet Take 1 tablet (50 mg total) by mouth every 12 (twelve) hours as needed for moderate pain. 60 tablet 2  . DULoxetine (CYMBALTA) 60 MG capsule Take 1 capsule (60 mg total) by mouth daily. 90 capsule 1  . hydrochlorothiazide (MICROZIDE) 12.5 MG capsule TAKE 1 CAPSULE BY MOUTH EVERY DAY 30 capsule 0  . lisinopril (PRINIVIL,ZESTRIL) 20 MG tablet TAKE 1 TABLET BY MOUTH EVERY DAY 30 tablet 0  . loratadine (CLARITIN) 10 MG tablet TAKE 1 TABLET (10 MG TOTAL) BY MOUTH DAILY. 30 tablet 2  . metoprolol tartrate (LOPRESSOR) 50 MG tablet TAKE 1 TABLET BY MOUTH TWICE A DAY 60 tablet 0  . sildenafil (REVATIO) 20 MG tablet TAKE 2 -5 TABLETS BY MOUTH ONCE DAILY AS NEEDED FOR SEXUAL ACTIVITY 50 tablet PRN  . amoxicillin (AMOXIL) 500 MG capsule Take 1 capsule (500 mg total) by mouth 3 (three) times daily. 30 capsule 0  . amoxicillin-clavulanate (AUGMENTIN) 875-125 MG tablet Take 1 tablet by mouth 2 (two) times daily. 20 tablet 0  . benzonatate (TESSALON) 100 MG capsule Take 1 capsule (100 mg total) by mouth 2 (two) times daily as needed for cough. 20 capsule 0  . CIALIS 20 MG tablet TAKE 1 TABLET BY MOUTH EVERY DAY (Patient not taking: Reported on 09/09/2017) 6 tablet 2  . guaiFENesin-codeine (ROBITUSSIN AC) 100-10 MG/5ML syrup Take 5 mLs by mouth 3 (three) times daily as needed for cough. 150 mL 0  . Omega-3 Fatty Acids (FISH OIL BURP-LESS) 1000 MG CAPS Take 1 capsule by mouth daily. 90 capsule 3   No facility-administered medications prior to visit.     Review of Systems  Constitutional: Negative for chills, fever, malaise/fatigue and weight loss.  HENT: Negative for ear discharge, ear pain and sore throat.   Eyes: Negative for blurred vision.  Respiratory: Negative for cough, sputum production, shortness of breath and  wheezing.   Cardiovascular: Negative for chest pain, palpitations, orthopnea, leg swelling and PND.  Gastrointestinal: Negative for abdominal pain, blood in stool, constipation, diarrhea, heartburn, melena and nausea.  Genitourinary: Positive for decreased libido. Negative for dysuria, frequency, hematuria, hesitancy, incomplete emptying, nocturia and urgency.  Musculoskeletal: Negative for back pain, joint pain, myalgias and neck pain.  Skin: Negative for rash.  Neurological: Negative for dizziness, tingling, sensory change, focal weakness and headaches.  Endo/Heme/Allergies: Negative for environmental allergies and polydipsia. Does not bruise/bleed easily.  Psychiatric/Behavioral: Negative for depression and suicidal ideas. The patient is not nervous/anxious and does not have insomnia.      Objective  Vitals:   09/09/17 1123  BP: 120/80  Pulse:  64  Weight: 229 lb (103.9 kg)  Height: 6' (1.829 m)    Physical Exam  Constitutional: He is oriented to person, place, and time.  HENT:  Head: Normocephalic.  Right Ear: External ear normal.  Left Ear: External ear normal.  Nose: Nose normal.  Mouth/Throat: Oropharynx is clear and moist.  Eyes: Pupils are equal, round, and reactive to light. Conjunctivae and EOM are normal. Right eye exhibits no discharge. Left eye exhibits no discharge. No scleral icterus.  Neck: Normal range of motion. Neck supple. No JVD present. No tracheal deviation present. No thyromegaly present.  Cardiovascular: Normal rate, regular rhythm, normal heart sounds and intact distal pulses. Exam reveals no gallop and no friction rub.  No murmur heard. Pulmonary/Chest: Breath sounds normal. No respiratory distress. He has no wheezes. He has no rales.  Abdominal: Soft. Bowel sounds are normal. He exhibits no mass. There is no hepatosplenomegaly. There is no tenderness. There is no rebound, no guarding and no CVA tenderness.  Musculoskeletal: Normal range of motion. He  exhibits no edema or tenderness.  Lymphadenopathy:    He has no cervical adenopathy.  Neurological: He is alert and oriented to person, place, and time. He has normal strength and normal reflexes. No cranial nerve deficit.  Skin: Skin is warm. No rash noted.  Nursing note and vitals reviewed.     Assessment & Plan  Problem List Items Addressed This Visit      Cardiovascular and Mediastinum   BP (high blood pressure) - Primary   Relevant Medications   metoprolol tartrate (LOPRESSOR) 50 MG tablet   lisinopril (PRINIVIL,ZESTRIL) 20 MG tablet   hydrochlorothiazide (MICROZIDE) 12.5 MG capsule   sildenafil (REVATIO) 20 MG tablet   Other Relevant Orders   Renal Function Panel     Respiratory   Allergic rhinitis due to pollen   Relevant Medications   loratadine (CLARITIN) 10 MG tablet     Genitourinary   Erectile dysfunction   Relevant Medications   sildenafil (REVATIO) 20 MG tablet      Meds ordered this encounter  Medications  . loratadine (CLARITIN) 10 MG tablet    Sig: TAKE 1 TABLET (10 MG TOTAL) BY MOUTH DAILY.    Dispense:  30 tablet    Refill:  11  . metoprolol tartrate (LOPRESSOR) 50 MG tablet    Sig: Take 1 tablet (50 mg total) by mouth 2 (two) times daily.    Dispense:  180 tablet    Refill:  1  . lisinopril (PRINIVIL,ZESTRIL) 20 MG tablet    Sig: Take 1 tablet (20 mg total) by mouth daily.    Dispense:  90 tablet    Refill:  1  . hydrochlorothiazide (MICROZIDE) 12.5 MG capsule    Sig: TAKE 1 CAPSULE BY MOUTH EVERY DAY    Dispense:  90 capsule    Refill:  1  . sildenafil (REVATIO) 20 MG tablet    Sig: TAKE 2 -5 TABLETS BY MOUTH ONCE DAILY AS NEEDED FOR SEXUAL ACTIVITY    Dispense:  50 tablet    Refill:  PRN    Generic For:*REVATIO 20MG       Dr. Zeno Hickel [facility]  09/09/17

## 2017-09-10 LAB — RENAL FUNCTION PANEL
Albumin: 4.7 g/dL (ref 3.5–5.5)
BUN / CREAT RATIO: 17 (ref 9–20)
BUN: 20 mg/dL (ref 6–24)
CHLORIDE: 100 mmol/L (ref 96–106)
CO2: 25 mmol/L (ref 20–29)
CREATININE: 1.21 mg/dL (ref 0.76–1.27)
Calcium: 10.6 mg/dL — ABNORMAL HIGH (ref 8.7–10.2)
GFR calc non Af Amer: 65 mL/min/{1.73_m2} (ref >59)
GFR, EST AFRICAN AMERICAN: 75 mL/min/{1.73_m2} (ref >59)
Glucose: 193 mg/dL — ABNORMAL HIGH (ref 65–99)
Phosphorus: 3.4 mg/dL (ref 2.5–4.5)
Potassium: 4.6 mmol/L (ref 3.5–5.2)
SODIUM: 141 mmol/L (ref 134–144)

## 2017-09-11 ENCOUNTER — Ambulatory Visit: Payer: BLUE CROSS/BLUE SHIELD | Admitting: Family Medicine

## 2017-09-11 DIAGNOSIS — G4733 Obstructive sleep apnea (adult) (pediatric): Secondary | ICD-10-CM | POA: Diagnosis not present

## 2017-09-13 ENCOUNTER — Other Ambulatory Visit: Payer: Self-pay | Admitting: Family Medicine

## 2017-09-13 DIAGNOSIS — G629 Polyneuropathy, unspecified: Secondary | ICD-10-CM

## 2017-09-17 ENCOUNTER — Other Ambulatory Visit: Payer: Self-pay

## 2017-11-12 DIAGNOSIS — E291 Testicular hypofunction: Secondary | ICD-10-CM | POA: Diagnosis not present

## 2017-11-12 DIAGNOSIS — C6291 Malignant neoplasm of right testis, unspecified whether descended or undescended: Secondary | ICD-10-CM | POA: Diagnosis not present

## 2017-11-12 DIAGNOSIS — E7259 Other disorders of glycine metabolism: Secondary | ICD-10-CM | POA: Diagnosis not present

## 2017-11-12 DIAGNOSIS — R739 Hyperglycemia, unspecified: Secondary | ICD-10-CM | POA: Diagnosis not present

## 2017-11-15 DIAGNOSIS — S62633A Displaced fracture of distal phalanx of left middle finger, initial encounter for closed fracture: Secondary | ICD-10-CM | POA: Diagnosis not present

## 2017-11-15 DIAGNOSIS — W298XXA Contact with other powered powered hand tools and household machinery, initial encounter: Secondary | ICD-10-CM | POA: Diagnosis not present

## 2017-11-15 DIAGNOSIS — Z23 Encounter for immunization: Secondary | ICD-10-CM | POA: Diagnosis not present

## 2017-11-15 DIAGNOSIS — Y93E9 Activity, other interior property and clothing maintenance: Secondary | ICD-10-CM | POA: Diagnosis not present

## 2017-11-15 DIAGNOSIS — I10 Essential (primary) hypertension: Secondary | ICD-10-CM | POA: Diagnosis not present

## 2017-11-15 DIAGNOSIS — Y92002 Bathroom of unspecified non-institutional (private) residence single-family (private) house as the place of occurrence of the external cause: Secondary | ICD-10-CM | POA: Diagnosis not present

## 2017-11-15 DIAGNOSIS — Z87891 Personal history of nicotine dependence: Secondary | ICD-10-CM | POA: Diagnosis not present

## 2017-11-15 DIAGNOSIS — S6992XA Unspecified injury of left wrist, hand and finger(s), initial encounter: Secondary | ICD-10-CM | POA: Diagnosis not present

## 2017-11-15 DIAGNOSIS — M799 Soft tissue disorder, unspecified: Secondary | ICD-10-CM | POA: Diagnosis not present

## 2017-11-15 DIAGNOSIS — S61213A Laceration without foreign body of left middle finger without damage to nail, initial encounter: Secondary | ICD-10-CM | POA: Diagnosis not present

## 2017-11-16 DIAGNOSIS — S61213A Laceration without foreign body of left middle finger without damage to nail, initial encounter: Secondary | ICD-10-CM | POA: Diagnosis not present

## 2017-11-16 DIAGNOSIS — M799 Soft tissue disorder, unspecified: Secondary | ICD-10-CM | POA: Diagnosis not present

## 2017-11-25 DIAGNOSIS — S61213A Laceration without foreign body of left middle finger without damage to nail, initial encounter: Secondary | ICD-10-CM | POA: Diagnosis not present

## 2017-12-28 DIAGNOSIS — G4733 Obstructive sleep apnea (adult) (pediatric): Secondary | ICD-10-CM | POA: Diagnosis not present

## 2018-03-07 ENCOUNTER — Other Ambulatory Visit: Payer: Self-pay | Admitting: Family Medicine

## 2018-03-07 DIAGNOSIS — I10 Essential (primary) hypertension: Secondary | ICD-10-CM

## 2018-03-21 ENCOUNTER — Other Ambulatory Visit: Payer: Self-pay | Admitting: Family Medicine

## 2018-03-21 DIAGNOSIS — I10 Essential (primary) hypertension: Secondary | ICD-10-CM

## 2018-03-29 ENCOUNTER — Ambulatory Visit (INDEPENDENT_AMBULATORY_CARE_PROVIDER_SITE_OTHER): Payer: BLUE CROSS/BLUE SHIELD | Admitting: Family Medicine

## 2018-03-29 ENCOUNTER — Encounter: Payer: Self-pay | Admitting: Family Medicine

## 2018-03-29 VITALS — BP 120/88 | HR 92 | Temp 98.1°F | Ht 69.0 in | Wt 210.0 lb

## 2018-03-29 DIAGNOSIS — J0100 Acute maxillary sinusitis, unspecified: Secondary | ICD-10-CM

## 2018-03-29 MED ORDER — BENZONATATE 100 MG PO CAPS: 100.0000 mg | ORAL_CAPSULE | Freq: Two times a day (BID) | ORAL | 0 refills | Status: DC | PRN

## 2018-03-29 MED ORDER — AZITHROMYCIN 250 MG PO TABS
ORAL_TABLET | ORAL | 0 refills | Status: DC
Start: 2018-03-29 — End: 2018-04-19

## 2018-03-29 NOTE — Progress Notes (Signed)
Date:  03/29/2018   Name:  Jerry Harmon   DOB:  17-Jan-1956   MRN:  [id number]   Chief Complaint: Sinusitis (cough and cong- thick production, head hurting and throat is sore)  Sinusitis  This is a new problem. The current episode started in the past 7 days. The problem has been gradually worsening since onset. There has been no fever. His pain is at a severity of 3/10. The pain is mild. Associated symptoms include congestion, coughing, headaches, sinus pressure, sneezing and a sore throat. Pertinent negatives include no chills, diaphoresis, ear pain, hoarse voice, neck pain, shortness of breath or swollen glands.    Review of Systems  Constitutional: Negative for chills, diaphoresis and fever.  HENT: Positive for congestion, sinus pressure, sneezing and sore throat. Negative for drooling, ear discharge, ear pain and hoarse voice.   Respiratory: Positive for cough. Negative for shortness of breath and wheezing.   Cardiovascular: Negative for chest pain, palpitations and leg swelling.  Gastrointestinal: Negative for abdominal pain, blood in stool, constipation, diarrhea and nausea.  Endocrine: Negative for polydipsia.  Genitourinary: Negative for dysuria, frequency, hematuria and urgency.  Musculoskeletal: Negative for back pain, myalgias and neck pain.  Skin: Negative for rash.  Allergic/Immunologic: Negative for environmental allergies.  Neurological: Positive for headaches. Negative for dizziness.  Hematological: Does not bruise/bleed easily.  Psychiatric/Behavioral: Negative for suicidal ideas. The patient is not nervous/anxious.     Patient Active Problem List   Diagnosis Date Noted  . Adjustment disorder 03/27/2016  . Neuropathy 11/13/2015  . Allergic rhinitis due to pollen 11/13/2015  . BP (high blood pressure) 09/11/2014  . Cardiomyopathy ([place]) 09/11/2014  . Sleep apnea 09/11/2014  . Blood pressure elevated without history of HTN 09/11/2014  . Erectile dysfunction  09/11/2014  . Lump in testis 09/11/2014  . 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 Cancer of testis, seminoma ([place]) 12/01/2013  . Benign prostatic hyperplasia with urinary obstruction 11/19/2013    No Known Allergies  Past Surgical History:  Procedure Laterality Date  . HERNIA REPAIR    . RADICAL ORCHIECTOMY      Social History   Tobacco Use  . Smoking status: Former Research scientist (life sciences)  . Smokeless tobacco: Never Used  Substance Use Topics  . Alcohol use: No    Alcohol/week: 0.0 standard drinks  . Drug use: No     Medication list has been reviewed and updated.  Current Meds  Medication Sig  . aspirin 81 MG tablet Take 1 tablet by mouth daily.  . hydrochlorothiazide (MICROZIDE) 12.5 MG capsule TAKE 1 CAPSULE BY MOUTH EVERY DAY  . ibuprofen (ADVIL,MOTRIN) 200 MG tablet Take 200 mg by mouth as needed.  Marland Kitchen lisinopril (PRINIVIL,ZESTRIL) 20 MG tablet TAKE 1 TABLET BY MOUTH EVERY DAY  . loratadine (CLARITIN) 10 MG tablet TAKE 1 TABLET (10 MG TOTAL) BY MOUTH DAILY.  . metoprolol tartrate (LOPRESSOR) 50 MG tablet TAKE 1 TABLET BY MOUTH TWICE A DAY  . Multiple Vitamins-Minerals (CENTRUM SILVER ADULT 50+ PO) Take 1 tablet by mouth daily.  . pregabalin (LYRICA) 200 MG capsule Take 1 capsule (200 mg total) by mouth 2 (two) times daily.  . sildenafil (REVATIO) 20 MG tablet TAKE 2 -5 TABLETS BY MOUTH ONCE DAILY AS NEEDED FOR SEXUAL ACTIVITY  . traMADol (ULTRAM) 50 MG tablet Take 1 tablet (50 mg total) by mouth every 12 (twelve) hours as needed for moderate pain.    PHQ 2/9 Scores 01/08/2017 08/23/2015  PHQ - 2 Score 0  0  PHQ- 9 Score 1 -    Physical Exam Vitals signs and nursing note reviewed.  HENT:     Head: Normocephalic.     Right Ear: Tympanic membrane and external ear normal.     Left Ear: Tympanic membrane and external ear normal.     Nose: Nose normal. No congestion or rhinorrhea.  Eyes:     General: No scleral icterus.       Right eye: No discharge.         Left eye: No discharge.     Conjunctiva/sclera: Conjunctivae normal.     Pupils: Pupils are equal, round, and reactive to light.  Neck:     Musculoskeletal: Normal range of motion and neck supple.     Thyroid: No thyromegaly.     Vascular: No JVD.     Trachea: No tracheal deviation.  Cardiovascular:     Rate and Rhythm: Normal rate and regular rhythm.     Heart sounds: Normal heart sounds. No murmur. No friction rub. No gallop.   Pulmonary:     Effort: No respiratory distress.     Breath sounds: Normal breath sounds. No wheezing or rales.  Abdominal:     General: Bowel sounds are normal.     Palpations: Abdomen is soft. There is no mass.     Tenderness: There is no abdominal tenderness. There is no guarding or rebound.  Musculoskeletal: Normal range of motion.        General: No tenderness.  Lymphadenopathy:     Cervical: No cervical adenopathy.  Skin:    General: Skin is warm.     Findings: No rash.  Neurological:     Mental Status: He is alert and oriented to person, place, and time.     Cranial Nerves: No cranial nerve deficit.     Deep Tendon Reflexes: Reflexes are normal and symmetric.     BP 120/88   Pulse 92   Temp 98.1 F (36.7 C) (Oral)   Ht 5\' 9"  (1.753 m)   Wt 210 lb (95.3 kg)   BMI 31.01 kg/m   Assessment and Plan:   1. Acute maxillary sinusitis, recurrence not specified 2.  Persistent.  Acute will continue a azithromycin 250 mg 2 today then 1 a day for 4 days and was also given Tessalon Perles as needed cough  - azithromycin (ZITHROMAX) 250 MG tablet; 2 today then 1 a day  Dispense: 6 tablet; Refill: 0 - benzonatate (TESSALON) 100 MG capsule; Take 1 capsule (100 mg total) by mouth 2 (two) times daily as needed for cough.  Dispense: 20 capsule; Refill: 0

## 2018-04-14 ENCOUNTER — Other Ambulatory Visit: Payer: Self-pay | Admitting: Family Medicine

## 2018-04-14 DIAGNOSIS — I10 Essential (primary) hypertension: Secondary | ICD-10-CM

## 2018-04-19 ENCOUNTER — Encounter: Payer: Self-pay | Admitting: Family Medicine

## 2018-04-19 ENCOUNTER — Ambulatory Visit (INDEPENDENT_AMBULATORY_CARE_PROVIDER_SITE_OTHER): Payer: BLUE CROSS/BLUE SHIELD | Admitting: Family Medicine

## 2018-04-19 VITALS — BP 130/64 | HR 80 | Ht 69.0 in | Wt 208.0 lb

## 2018-04-19 DIAGNOSIS — I10 Essential (primary) hypertension: Secondary | ICD-10-CM | POA: Diagnosis not present

## 2018-04-19 MED ORDER — METOPROLOL TARTRATE 50 MG PO TABS: 50.0000 mg | ORAL_TABLET | Freq: Two times a day (BID) | ORAL | 5 refills | Status: DC

## 2018-04-19 MED ORDER — HYDROCHLOROTHIAZIDE 12.5 MG PO CAPS: 12.5000 mg | ORAL_CAPSULE | Freq: Every day | ORAL | 1 refills | Status: DC

## 2018-04-19 MED ORDER — LISINOPRIL 20 MG PO TABS: 20.0000 mg | ORAL_TABLET | Freq: Every day | ORAL | 1 refills | Status: DC

## 2018-04-19 NOTE — Progress Notes (Signed)
Date:  04/19/2018   Name:  Jerry Harmon   DOB:  [birth year]   MRN:  [id number]   Chief Complaint: Hypertension and Peripheral Neuropathy  Hypertension  This is a chronic problem. The current episode started more than 1 year ago. The problem has been gradually improving since onset. The problem is controlled. Pertinent negatives include no anxiety, blurred vision, chest pain, headaches, malaise/fatigue, neck pain, orthopnea, palpitations, peripheral edema, PND, shortness of breath or sweats. There are no associated agents to hypertension. Risk factors for coronary artery disease include post-menopausal state. Past treatments include beta blockers, ACE inhibitors and diuretics. The current treatment provides moderate improvement. There are no compliance problems.  There is no history of angina, kidney disease, CAD/MI, CVA, heart failure, left ventricular hypertrophy, PVD or retinopathy. There is no history of chronic renal disease, a hypertension causing med or renovascular disease.    Review of Systems  Constitutional: Negative for chills, fever and malaise/fatigue.  HENT: Negative for drooling, ear discharge, ear pain and sore throat.   Eyes: Negative for blurred vision.  Respiratory: Negative for cough, shortness of breath and wheezing.   Cardiovascular: Negative for chest pain, palpitations, orthopnea, leg swelling and PND.  Gastrointestinal: Negative for abdominal pain, blood in stool, constipation, diarrhea and nausea.  Endocrine: Negative for polydipsia.  Genitourinary: Negative for dysuria, frequency, hematuria and urgency.  Musculoskeletal: Negative for back pain, myalgias and neck pain.  Skin: Negative for rash.  Allergic/Immunologic: Negative for environmental allergies.  Neurological: Negative for dizziness and headaches.  Hematological: Does not bruise/bleed easily.  Psychiatric/Behavioral: Negative for suicidal ideas. The patient is not nervous/anxious.     Patient Active  Problem List   Diagnosis Date Noted  . Adjustment disorder 03/27/2016  . Neuropathy 11/13/2015  . Allergic rhinitis due to pollen 11/13/2015  . BP (high blood pressure) 09/11/2014  . Cardiomyopathy ([place]) 09/11/2014  . Sleep apnea 09/11/2014  . Blood pressure elevated without history of HTN 09/11/2014  . Erectile dysfunction 09/11/2014  . Lump in testis 09/11/2014  . 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 Cancer of testis, seminoma ([place]) 12/01/2013  . Benign prostatic hyperplasia with urinary obstruction 11/19/2013    No Known Allergies  Past Surgical History:  Procedure Laterality Date  . HERNIA REPAIR    . RADICAL ORCHIECTOMY      Social History   Tobacco Use  . Smoking status: Former Research scientist (life sciences)  . Smokeless tobacco: Never Used  Substance Use Topics  . Alcohol use: No    Alcohol/week: 0.0 standard drinks  . Drug use: No     Medication list has been reviewed and updated.  Current Meds  Medication Sig  . aspirin 81 MG tablet Take 1 tablet by mouth daily.  . hydrochlorothiazide (MICROZIDE) 12.5 MG capsule TAKE 1 CAPSULE BY MOUTH EVERY DAY  . ibuprofen (ADVIL,MOTRIN) 200 MG tablet Take 200 mg by mouth as needed.  Marland Kitchen lisinopril (PRINIVIL,ZESTRIL) 20 MG tablet TAKE 1 TABLET BY MOUTH EVERY DAY  . loratadine (CLARITIN) 10 MG tablet TAKE 1 TABLET (10 MG TOTAL) BY MOUTH DAILY.  . metoprolol tartrate (LOPRESSOR) 50 MG tablet TAKE 1 TABLET BY MOUTH TWICE A DAY  . Multiple Vitamins-Minerals (CENTRUM SILVER ADULT 50+ PO) Take 1 tablet by mouth daily.  . pregabalin (LYRICA) 200 MG capsule Take 1 capsule (200 mg total) by mouth 2 (two) times daily.  . sildenafil (REVATIO) 20 MG tablet TAKE 2 -5 TABLETS BY MOUTH ONCE DAILY AS NEEDED FOR SEXUAL ACTIVITY  .  traMADol (ULTRAM) 50 MG tablet Take 1 tablet (50 mg total) by mouth every 12 (twelve) hours as needed for moderate pain.    PHQ 2/9 Scores 01/08/2017 08/23/2015  PHQ - 2 Score 0 0  PHQ- 9 Score 1 -    Physical  Exam Vitals signs and nursing note reviewed.  HENT:     Head: Normocephalic.     Right Ear: Tympanic membrane, ear canal and external ear normal.     Left Ear: Tympanic membrane, ear canal and external ear normal.     Nose: Nose normal. No congestion or rhinorrhea.     Mouth/Throat:     Mouth: Mucous membranes are moist.  Eyes:     General: No scleral icterus.       Right eye: No discharge.        Left eye: No discharge.     Conjunctiva/sclera: Conjunctivae normal.     Pupils: Pupils are equal, round, and reactive to light.  Neck:     Musculoskeletal: Normal range of motion and neck supple.     Thyroid: No thyromegaly.     Vascular: No carotid bruit or JVD.     Trachea: No tracheal deviation.  Cardiovascular:     Rate and Rhythm: Normal rate and regular rhythm.     Pulses: Normal pulses.     Heart sounds: Normal heart sounds. No murmur. No friction rub. No gallop.   Pulmonary:     Effort: No respiratory distress.     Breath sounds: Normal breath sounds. No wheezing, rhonchi or rales.  Abdominal:     General: Bowel sounds are normal.     Palpations: Abdomen is soft. There is no mass.     Tenderness: There is no abdominal tenderness. There is no guarding or rebound.  Musculoskeletal: Normal range of motion.        General: No tenderness.  Lymphadenopathy:     Cervical: No cervical adenopathy.  Skin:    General: Skin is warm.     Capillary Refill: Capillary refill takes less than 2 seconds.     Findings: No rash.  Neurological:     Mental Status: He is alert and oriented to person, place, and time.     Cranial Nerves: No cranial nerve deficit.     Motor: No weakness.     Deep Tendon Reflexes: Reflexes are normal and symmetric.     BP 130/64   Pulse 80   Ht 5\' 9"  (1.753 m)   Wt 208 lb (94.3 kg)   BMI 30.72 kg/m   Assessment and Plan: 1. Essential hypertension  [name] blood pressure is in normal range despite the fact that his father-in-law moving in with  them.  Will therefore definitely continue his metoprolol 50 mg twice a day lisinopril 20 mg once a day and hydrochlorothiazide once a day.  Will check renal function panel. - metoprolol tartrate (LOPRESSOR) 50 MG tablet; Take 1 tablet (50 mg total) by mouth 2 (two) times daily.  Dispense: 60 tablet; Refill: 5 - lisinopril (PRINIVIL,ZESTRIL) 20 MG tablet; Take 1 tablet (20 mg total) by mouth daily.  Dispense: 90 tablet; Refill: 1 - hydrochlorothiazide (MICROZIDE) 12.5 MG capsule; Take 1 capsule (12.5 mg total) by mouth daily.  Dispense: 90 capsule; Refill: 1 - Renal Function Panel

## 2018-04-20 ENCOUNTER — Ambulatory Visit: Payer: BLUE CROSS/BLUE SHIELD | Admitting: Family Medicine

## 2018-04-20 LAB — RENAL FUNCTION PANEL
Albumin: 4.3 g/dL (ref 3.6–4.8)
BUN/Creatinine Ratio: 19 (ref 10–24)
BUN: 20 mg/dL (ref 8–27)
CO2: 23 mmol/L (ref 20–29)
CREATININE: 1.05 mg/dL (ref 0.76–1.27)
Calcium: 9.6 mg/dL (ref 8.6–10.2)
Chloride: 105 mmol/L (ref 96–106)
GFR calc non Af Amer: 77 mL/min/{1.73_m2} (ref >59)
GFR, EST AFRICAN AMERICAN: 89 mL/min/{1.73_m2} (ref >59)
Glucose: 92 mg/dL (ref 65–99)
Phosphorus: 2.9 mg/dL (ref 2.8–4.1)
Potassium: 4.4 mmol/L (ref 3.5–5.2)
Sodium: 144 mmol/L (ref 134–144)

## 2018-04-27 DIAGNOSIS — G4733 Obstructive sleep apnea (adult) (pediatric): Secondary | ICD-10-CM | POA: Diagnosis not present

## 2018-05-20 DIAGNOSIS — Z08 Encounter for follow-up examination after completed treatment for malignant neoplasm: Secondary | ICD-10-CM | POA: Diagnosis not present

## 2018-05-20 DIAGNOSIS — R51 Headache: Secondary | ICD-10-CM | POA: Diagnosis not present

## 2018-05-20 DIAGNOSIS — R5383 Other fatigue: Secondary | ICD-10-CM | POA: Diagnosis not present

## 2018-05-20 DIAGNOSIS — D649 Anemia, unspecified: Secondary | ICD-10-CM | POA: Diagnosis not present

## 2018-05-20 DIAGNOSIS — R208 Other disturbances of skin sensation: Secondary | ICD-10-CM | POA: Diagnosis not present

## 2018-05-20 DIAGNOSIS — Z9989 Dependence on other enabling machines and devices: Secondary | ICD-10-CM | POA: Diagnosis not present

## 2018-05-20 DIAGNOSIS — C6290 Malignant neoplasm of unspecified testis, unspecified whether descended or undescended: Secondary | ICD-10-CM | POA: Diagnosis not present

## 2018-05-20 DIAGNOSIS — Z9221 Personal history of antineoplastic chemotherapy: Secondary | ICD-10-CM | POA: Diagnosis not present

## 2018-05-20 DIAGNOSIS — Z8547 Personal history of malignant neoplasm of testis: Secondary | ICD-10-CM | POA: Diagnosis not present

## 2018-05-20 DIAGNOSIS — M85421 Solitary bone cyst, right humerus: Secondary | ICD-10-CM | POA: Diagnosis not present

## 2018-05-20 DIAGNOSIS — Z6829 Body mass index (BMI) 29.0-29.9, adult: Secondary | ICD-10-CM | POA: Diagnosis not present

## 2018-05-20 DIAGNOSIS — G4733 Obstructive sleep apnea (adult) (pediatric): Secondary | ICD-10-CM | POA: Diagnosis not present

## 2018-05-20 DIAGNOSIS — I10 Essential (primary) hypertension: Secondary | ICD-10-CM | POA: Diagnosis not present

## 2018-05-20 DIAGNOSIS — R739 Hyperglycemia, unspecified: Secondary | ICD-10-CM | POA: Diagnosis not present

## 2018-05-20 DIAGNOSIS — R590 Localized enlarged lymph nodes: Secondary | ICD-10-CM | POA: Diagnosis not present

## 2018-05-20 DIAGNOSIS — K5790 Diverticulosis of intestine, part unspecified, without perforation or abscess without bleeding: Secondary | ICD-10-CM | POA: Diagnosis not present

## 2018-05-20 DIAGNOSIS — C6291 Malignant neoplasm of right testis, unspecified whether descended or undescended: Secondary | ICD-10-CM | POA: Diagnosis not present

## 2018-05-20 DIAGNOSIS — Z9079 Acquired absence of other genital organ(s): Secondary | ICD-10-CM | POA: Diagnosis not present

## 2018-05-20 DIAGNOSIS — Z8719 Personal history of other diseases of the digestive system: Secondary | ICD-10-CM | POA: Diagnosis not present

## 2018-05-20 DIAGNOSIS — R918 Other nonspecific abnormal finding of lung field: Secondary | ICD-10-CM | POA: Diagnosis not present

## 2018-06-07 ENCOUNTER — Other Ambulatory Visit: Payer: Self-pay | Admitting: Family Medicine

## 2018-06-07 DIAGNOSIS — G629 Polyneuropathy, unspecified: Secondary | ICD-10-CM

## 2018-06-08 ENCOUNTER — Other Ambulatory Visit: Payer: Self-pay

## 2018-06-08 DIAGNOSIS — G629 Polyneuropathy, unspecified: Secondary | ICD-10-CM

## 2018-06-08 MED ORDER — PREGABALIN 200 MG PO CAPS: 200.0000 mg | ORAL_CAPSULE | Freq: Two times a day (BID) | ORAL | 3 refills | Status: DC

## 2018-06-08 NOTE — Progress Notes (Unsigned)
Printed [place] and faxed

## 2018-06-28 DIAGNOSIS — G4733 Obstructive sleep apnea (adult) (pediatric): Secondary | ICD-10-CM | POA: Diagnosis not present

## 2018-07-20 ENCOUNTER — Other Ambulatory Visit: Payer: Self-pay | Admitting: Family Medicine

## 2018-07-20 DIAGNOSIS — J301 Allergic rhinitis due to pollen: Secondary | ICD-10-CM

## 2018-08-06 ENCOUNTER — Other Ambulatory Visit: Payer: Self-pay

## 2018-08-06 ENCOUNTER — Encounter: Payer: Self-pay | Admitting: Family Medicine

## 2018-08-06 ENCOUNTER — Ambulatory Visit (INDEPENDENT_AMBULATORY_CARE_PROVIDER_SITE_OTHER): Payer: BLUE CROSS/BLUE SHIELD | Admitting: Family Medicine

## 2018-08-06 VITALS — BP 134/88 | HR 72 | Ht 69.0 in | Wt 214.0 lb

## 2018-08-06 DIAGNOSIS — S61209A Unspecified open wound of unspecified finger without damage to nail, initial encounter: Secondary | ICD-10-CM

## 2018-08-06 MED ORDER — CEPHALEXIN 500 MG PO CAPS: 500.0000 mg | ORAL_CAPSULE | Freq: Two times a day (BID) | ORAL | 1 refills | Status: DC

## 2018-08-06 MED ORDER — MUPIROCIN 2 % EX OINT: 1.0000 "application " | TOPICAL_OINTMENT | Freq: Two times a day (BID) | CUTANEOUS | 0 refills | Status: DC

## 2018-08-06 NOTE — Patient Instructions (Signed)
Wound Care, Adult  Taking care of your wound properly can help to prevent pain, infection, and scarring. It can also help your wound to heal more quickly.  How to care for your wound  Wound care          Follow instructions from your health care provider about how to take care of your wound. Make sure you:  ? Wash your hands with soap and water before you change the bandage (dressing). If soap and water are not available, use hand sanitizer.  ? Change your dressing as told by your health care provider.  ? Leave stitches (sutures), skin glue, or adhesive strips in place. These skin closures may need to stay in place for 2 weeks or longer. If adhesive strip edges start to loosen and curl up, you may trim the loose edges. Do not remove adhesive strips completely unless your health care provider tells you to do that.   Check your wound area every day for signs of infection. Check for:  ? Redness, swelling, or pain.  ? Fluid or blood.  ? Warmth.  ? Pus or a bad smell.   Ask your health care provider if you should clean the wound with mild soap and water. Doing this may include:  ? Using a clean towel to pat the wound dry after cleaning it. Do not rub or scrub the wound.  ? Applying a cream or ointment. Do this only as told by your health care provider.  ? Covering the incision with a clean dressing.   Ask your health care provider when you can leave the wound uncovered.   Keep the dressing dry until your health care provider says it can be removed. Do not take baths, swim, use a hot tub, or do anything that would put the wound underwater until your health care provider approves. Ask your health care provider if you can take showers. You may only be allowed to take sponge baths.  Medicines     If you were prescribed an antibiotic medicine, cream, or ointment, take or use the antibiotic as told by your health care provider. Do not stop taking or using the antibiotic even if your condition improves.   Take  over-the-counter and prescription medicines only as told by your health care provider. If you were prescribed pain medicine, take it 30 or more minutes before you do any wound care or as told by your health care provider.  General instructions   Return to your normal activities as told by your health care provider. Ask your health care provider what activities are safe.   Do not scratch or pick at the wound.   Do not use any products that contain nicotine or tobacco, such as cigarettes and e-cigarettes. These may delay wound healing. If you need help quitting, ask your health care provider.   Keep all follow-up visits as told by your health care provider. This is important.   Eat a diet that includes protein, vitamin A, vitamin C, and other nutrient-rich foods to help the wound heal.  ? Foods rich in protein include meat, dairy, beans, nuts, and other sources.  ? Foods rich in vitamin A include carrots and dark green, leafy vegetables.  ? Foods rich in vitamin C include citrus, tomatoes, and other fruits and vegetables.  ? Nutrient-rich foods have protein, carbohydrates, fat, vitamins, or minerals. Eat a variety of healthy foods including vegetables, fruits, and whole grains.  Contact a health care provider if:     You received a tetanus shot and you have swelling, severe pain, redness, or bleeding at the injection site.   Your pain is not controlled with medicine.   You have redness, swelling, or pain around the wound.   You have fluid or blood coming from the wound.   Your wound feels warm to the touch.   You have pus or a bad smell coming from the wound.   You have a fever or chills.   You are nauseous or you vomit.   You are dizzy.  Get help right away if:   You have a red streak going away from your wound.   The edges of the wound open up and separate.   Your wound is bleeding, and the bleeding does not stop with gentle pressure.   You have a rash.   You faint.   You have trouble  breathing.  Summary   Always wash your hands with soap and water before changing your bandage (dressing).   To help with healing, eat foods that are rich in protein, vitamin A, vitamin C, and other nutrients.   Check your wound every day for signs of infection. Contact your health care provider if you suspect that your wound is infected.  This information is not intended to replace advice given to you by your health care provider. Make sure you discuss any questions you have with your health care provider.  Document Released: 01/01/2008 Document Revised: 05/05/2017 Document Reviewed: 10/09/2015  Elsevier Interactive Patient Education  2019 [employer]

## 2018-08-06 NOTE — Progress Notes (Signed)
Date:  08/06/2018   Name:  Jerry Harmon   DOB:  02/10/1956   MRN:  [id number]   Chief Complaint: Follow-up (cut finger with shears- antibiotic?)  Sharp shears + not paying attention = bloody disaster.  Probably ran with scissors as a kid.   Review of Systems  Constitutional: Negative for chills and fever.  HENT: Negative for drooling, ear discharge, ear pain and sore throat.   Respiratory: Negative for cough, shortness of breath and wheezing.   Cardiovascular: Negative for chest pain, palpitations and leg swelling.  Gastrointestinal: Negative for abdominal pain, blood in stool, constipation, diarrhea and nausea.  Endocrine: Negative for polydipsia.  Genitourinary: Negative for dysuria, frequency, hematuria and urgency.  Musculoskeletal: Negative for back pain, myalgias and neck pain.  Skin: Negative for rash.  Allergic/Immunologic: Negative for environmental allergies.  Neurological: Negative for dizziness and headaches.  Hematological: Does not bruise/bleed easily.  Psychiatric/Behavioral: Negative for suicidal ideas. The patient is not nervous/anxious.     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  Current Meds  Medication Sig   aspirin 81 MG tablet Take 1 tablet by mouth daily.   hydrochlorothiazide (MICROZIDE) 12.5 MG capsule Take 1 capsule (12.5 mg total) by mouth daily.   ibuprofen (ADVIL,MOTRIN) 200 MG tablet Take 200 mg by mouth as needed.   lisinopril (PRINIVIL,ZESTRIL) 20 MG tablet Take 1 tablet (20 mg total) by mouth daily.   loratadine (CLARITIN) 10 MG tablet TAKE 1 TABLET BY MOUTH EVERY DAY   metoprolol tartrate (LOPRESSOR) 50 MG tablet Take 1 tablet (50 mg total) by mouth 2 (two) times daily.   Multiple Vitamins-Minerals (CENTRUM SILVER ADULT 50+ PO) Take 1 tablet by mouth daily.   pregabalin (LYRICA) 200 MG capsule Take 1 capsule (200 mg total) by mouth 2 (two) times daily.   sildenafil (REVATIO) 20 MG tablet TAKE 2 -5 TABLETS BY MOUTH ONCE DAILY AS NEEDED FOR SEXUAL ACTIVITY   traMADol (ULTRAM) 50 MG tablet Take 1 tablet (50 mg total) by mouth every 12 (twelve) hours as needed for moderate pain.    PHQ 2/9 Scores 01/08/2017 08/23/2015  PHQ - 2 Score 0 0  PHQ- 9 Score 1 -    BP Readings from Last 3 Encounters:  08/06/18 134/88  04/19/18 130/64  03/29/18 120/88    Physical Exam Vitals signs and nursing note reviewed.  HENT:     Head: Normocephalic.     Right Ear: External ear normal.     Left Ear: External ear normal.     Nose: Nose normal.  Eyes:     General: No scleral icterus.       Right eye: No discharge.        Left eye: No discharge.     Conjunctiva/sclera: Conjunctivae normal.     Pupils: Pupils are equal, round, and reactive to light.  Neck:     Musculoskeletal: Normal range of motion and neck supple.     Thyroid: No thyromegaly.     Vascular: No JVD.     Trachea: No tracheal deviation.  Cardiovascular:     Rate and Rhythm: Normal rate and regular rhythm.     Heart sounds: Normal heart sounds. No murmur. No friction rub. No  gallop.   Pulmonary:     Effort: No respiratory distress.     Breath sounds: Normal breath sounds. No wheezing or rales.  Abdominal:     General: Bowel sounds are normal.     Palpations: Abdomen is soft. There is no mass.     Tenderness: There is no abdominal tenderness. There is no guarding or rebound.  Musculoskeletal: Normal range of motion.        General: No tenderness.  Lymphadenopathy:     Cervical: No cervical adenopathy.  Skin:    General: Skin is warm.     Capillary Refill: Capillary refill takes less than 2 seconds.     Findings: No rash.     Comments: Avulsed pad fingertip left index/nest of reepithelializations noted  Neurological:     Mental Status: He is alert and oriented to person, place, and time.     Cranial Nerves: No cranial nerve deficit.     Deep Tendon Reflexes: Reflexes are normal and symmetric.     Wt Readings from Last 3 Encounters:  08/06/18 214 lb (97.1 kg)  04/19/18 208 lb (94.3 kg)  03/29/18 210 lb (95.3 kg)    BP 134/88    Pulse 72    Ht 5\' 9"  (1.753 m)    Wt 214 lb (97.1 kg)    BMI 31.60 kg/m   Assessment and Plan:  1. Avulsion, finger tip, initial encounter Patient accidentally nipped off the tip of his left index finger with some sharp shears.  This looks like it is healing well but patient just wanted be certain on exam there is noted to be healing already from the edges of the evulsion as well as the mid area will continue with dressing but will incorporate Bactroban ointment 2% after cleaning with an antibacterial soap as instructions were given and use of a Telfa pad with dressing to prevent further infection.  Patient will return as needed otherwise this should heal fine on its own. - mupirocin ointment (BACTROBAN) 2 %; Place 1 application into the nose 2 (two) times daily.  Dispense: 22 g; Refill: 0 - cephALEXin (KEFLEX) 500 MG capsule; Take 1 capsule (500 mg total) by mouth 2 (two) times daily.  Dispense: 20 capsule; Refill: 1

## 2018-08-17 DIAGNOSIS — M79641 Pain in right hand: Secondary | ICD-10-CM | POA: Diagnosis not present

## 2018-08-17 DIAGNOSIS — M151 Heberden's nodes (with arthropathy): Secondary | ICD-10-CM | POA: Diagnosis not present

## 2018-08-25 DIAGNOSIS — D485 Neoplasm of uncertain behavior of skin: Secondary | ICD-10-CM | POA: Diagnosis not present

## 2018-08-25 DIAGNOSIS — L570 Actinic keratosis: Secondary | ICD-10-CM | POA: Diagnosis not present

## 2018-08-25 DIAGNOSIS — L578 Other skin changes due to chronic exposure to nonionizing radiation: Secondary | ICD-10-CM | POA: Diagnosis not present

## 2018-09-07 ENCOUNTER — Other Ambulatory Visit: Payer: Self-pay

## 2018-09-07 DIAGNOSIS — S61209A Unspecified open wound of unspecified finger without damage to nail, initial encounter: Secondary | ICD-10-CM

## 2018-09-07 MED ORDER — MUPIROCIN 2 % EX OINT: 1.0000 "application " | TOPICAL_OINTMENT | Freq: Two times a day (BID) | CUTANEOUS | 0 refills | Status: DC

## 2018-09-16 DIAGNOSIS — L918 Other hypertrophic disorders of the skin: Secondary | ICD-10-CM | POA: Diagnosis not present

## 2018-09-16 DIAGNOSIS — C44629 Squamous cell carcinoma of skin of left upper limb, including shoulder: Secondary | ICD-10-CM | POA: Diagnosis not present

## 2018-09-28 DIAGNOSIS — M1A00X0 Idiopathic chronic gout, unspecified site, without tophus (tophi): Secondary | ICD-10-CM | POA: Diagnosis not present

## 2018-10-05 ENCOUNTER — Other Ambulatory Visit: Payer: Self-pay | Admitting: Family Medicine

## 2018-10-08 ENCOUNTER — Other Ambulatory Visit: Payer: Self-pay | Admitting: Family Medicine

## 2018-10-08 DIAGNOSIS — I10 Essential (primary) hypertension: Secondary | ICD-10-CM

## 2018-10-17 ENCOUNTER — Other Ambulatory Visit: Payer: Self-pay | Admitting: Family Medicine

## 2018-10-17 DIAGNOSIS — G629 Polyneuropathy, unspecified: Secondary | ICD-10-CM

## 2018-10-19 ENCOUNTER — Other Ambulatory Visit: Payer: Self-pay

## 2018-10-19 ENCOUNTER — Ambulatory Visit (INDEPENDENT_AMBULATORY_CARE_PROVIDER_SITE_OTHER): Payer: BC Managed Care – PPO | Admitting: Family Medicine

## 2018-10-19 ENCOUNTER — Other Ambulatory Visit: Payer: Self-pay | Admitting: Family Medicine

## 2018-10-19 ENCOUNTER — Encounter: Payer: Self-pay | Admitting: Family Medicine

## 2018-10-19 VITALS — BP 120/60 | HR 88 | Ht 69.0 in | Wt 217.0 lb

## 2018-10-19 DIAGNOSIS — H1031 Unspecified acute conjunctivitis, right eye: Secondary | ICD-10-CM | POA: Diagnosis not present

## 2018-10-19 DIAGNOSIS — I10 Essential (primary) hypertension: Secondary | ICD-10-CM

## 2018-10-19 MED ORDER — SULFACETAMIDE SODIUM 10 % OP SOLN: 1.0000 [drp] | OPHTHALMIC | 0 refills | Status: DC

## 2018-10-19 NOTE — Progress Notes (Signed)
Date:  10/19/2018   Name:  Jerry Harmon   DOB:  July 21, 1955   MRN:  [id number]   Chief Complaint: Conjunctivitis (R) eye started "running" last Thursday- red, itchy, irritable, swollen)  Conjunctivitis  The current episode started 3 to 5 days ago. The onset was sudden. The problem occurs occasionally. The problem has been gradually worsening. The problem is mild. Pertinent negatives include no fever, no decreased vision, no double vision, no eye itching, no photophobia, no abdominal pain, no constipation, no diarrhea, no nausea, no congestion, no ear discharge, no ear pain, no headaches, no hearing loss, no mouth sores, no rhinorrhea, no sore throat, no stridor, no swollen glands, no neck pain, no cough, no wheezing, no rash, no eye discharge, no eye pain and no eye redness.    Review of Systems  Constitutional: Negative for chills and fever.  HENT: Negative for congestion, drooling, ear discharge, ear pain, hearing loss, mouth sores, rhinorrhea and sore throat.   Eyes: Negative for double vision, photophobia, pain, discharge, redness and itching.  Respiratory: Negative for cough, shortness of breath, wheezing and stridor.   Cardiovascular: Negative for chest pain, palpitations and leg swelling.  Gastrointestinal: Negative for abdominal pain, blood in stool, constipation, diarrhea and nausea.  Endocrine: Negative for polydipsia.  Genitourinary: Negative for dysuria, frequency, hematuria and urgency.  Musculoskeletal: Negative for back pain, myalgias and neck pain.  Skin: Negative for rash.  Allergic/Immunologic: Negative for environmental allergies.  Neurological: Negative for dizziness and headaches.  Hematological: Does not bruise/bleed easily.  Psychiatric/Behavioral: Negative for suicidal ideas. The patient is not nervous/anxious.     Patient Active Problem List   Diagnosis Date Noted  . Adjustment disorder 03/27/2016  . Neuropathy 11/13/2015  . Allergic rhinitis due to pollen  11/13/2015  . BP (high blood pressure) 09/11/2014  . Cardiomyopathy ([place]) 09/11/2014  . Sleep apnea 09/11/2014  . Blood pressure elevated without history of HTN 09/11/2014  . Erectile dysfunction 09/11/2014  . Lump in testis 09/11/2014  . 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 Cancer of testis, seminoma ([place]) 12/01/2013  . Benign prostatic hyperplasia with urinary obstruction 11/19/2013    No Known Allergies  Past Surgical History:  Procedure Laterality Date  . HERNIA REPAIR    . RADICAL ORCHIECTOMY      Social History   Tobacco Use  . Smoking status: Former Research scientist (life sciences)  . Smokeless tobacco: Never Used  Substance Use Topics  . Alcohol use: No    Alcohol/week: 0.0 standard drinks  . Drug use: No     Medication list has been reviewed and updated.  Current Meds  Medication Sig  . allopurinol (ZYLOPRIM) 100 MG tablet Take 200 mg by mouth daily. ortho  . aspirin 81 MG tablet Take 1 tablet by mouth daily.  . hydrochlorothiazide (MICROZIDE) 12.5 MG capsule TAKE 1 CAPSULE BY MOUTH EVERY DAY  . ibuprofen (ADVIL,MOTRIN) 200 MG tablet Take 200 mg by mouth as needed.  Marland Kitchen lisinopril (PRINIVIL,ZESTRIL) 20 MG tablet Take 1 tablet (20 mg total) by mouth daily.  Marland Kitchen loratadine (CLARITIN) 10 MG tablet TAKE 1 TABLET BY MOUTH EVERY DAY  . metoprolol tartrate (LOPRESSOR) 50 MG tablet TAKE 1 TABLET BY MOUTH TWICE A DAY  . Multiple Vitamins-Minerals (CENTRUM SILVER ADULT 50+ PO) Take 1 tablet by mouth daily.  . mupirocin ointment (BACTROBAN) 2 % Place 1 application into the nose 2 (two) times daily.  . pregabalin (LYRICA) 200 MG capsule TAKE 1 CAPSULE BY MOUTH TWICE A  DAY  . sildenafil (REVATIO) 20 MG tablet TAKE 2 -5 TABLETS BY MOUTH ONCE DAILY AS NEEDED FOR SEXUAL ACTIVITY  . traMADol (ULTRAM) 50 MG tablet Take 1 tablet (50 mg total) by mouth every 12 (twelve) hours as needed for moderate pain.    PHQ 2/9 Scores 01/08/2017 08/23/2015  PHQ - 2 Score 0 0  PHQ- 9 Score 1 -     BP Readings from Last 3 Encounters:  10/19/18 120/60  08/06/18 134/88  04/19/18 130/64    Physical Exam Vitals signs and nursing note reviewed.  HENT:     Head: Normocephalic.     Right Ear: Tympanic membrane, ear canal and external ear normal.     Left Ear: Tympanic membrane, ear canal and external ear normal.     Nose: Nose normal.  Eyes:     General: No scleral icterus.       Right eye: No discharge.        Left eye: No discharge.     Conjunctiva/sclera: Conjunctivae normal.     Pupils: Pupils are equal, round, and reactive to light.  Neck:     Musculoskeletal: Normal range of motion and neck supple.     Thyroid: No thyromegaly.     Vascular: No JVD.     Trachea: No tracheal deviation.  Cardiovascular:     Rate and Rhythm: Normal rate and regular rhythm.     Pulses: Normal pulses.     Heart sounds: Normal heart sounds. No murmur. No friction rub. No gallop.   Pulmonary:     Effort: No respiratory distress.     Breath sounds: Normal breath sounds. No stridor. No wheezing, rhonchi or rales.  Chest:     Chest wall: No tenderness.  Abdominal:     General: Bowel sounds are normal.     Palpations: Abdomen is soft. There is no mass.     Tenderness: There is no abdominal tenderness. There is no guarding or rebound.  Musculoskeletal: Normal range of motion.        General: No tenderness.  Lymphadenopathy:     Cervical: No cervical adenopathy.  Skin:    General: Skin is warm.     Findings: No rash.  Neurological:     Mental Status: He is alert and oriented to person, place, and time.     Cranial Nerves: No cranial nerve deficit.     Deep Tendon Reflexes: Reflexes are normal and symmetric.     Wt Readings from Last 3 Encounters:  10/19/18 217 lb (98.4 kg)  08/06/18 214 lb (97.1 kg)  04/19/18 208 lb (94.3 kg)    BP 120/60   Pulse 88   Ht 5\' 9"  (1.753 m)   Wt 217 lb (98.4 kg)   BMI 32.05 kg/m   Assessment and Plan:  1. Acute bacterial conjunctivitis of right  eye Acute.  Persistent.  Patient's eye was examined and there was no foreign body noted underneath upper eyelid.  Will initiate Sodium Sulamyd 10% ophthalmic drops 1 drop every 3 hours as well as encouraged to wash out with ophthalmic wash prior to then apply medication. - sulfacetamide (BLEPH-10) 10 % ophthalmic solution; Place 1 drop into both eyes every 3 (three) hours.  Dispense: 15 mL; Refill: 0

## 2018-10-20 ENCOUNTER — Other Ambulatory Visit: Payer: Self-pay | Admitting: Family Medicine

## 2018-10-20 DIAGNOSIS — J301 Allergic rhinitis due to pollen: Secondary | ICD-10-CM

## 2018-10-28 ENCOUNTER — Other Ambulatory Visit: Payer: Self-pay

## 2018-10-28 DIAGNOSIS — B001 Herpesviral vesicular dermatitis: Secondary | ICD-10-CM

## 2018-10-28 MED ORDER — VALACYCLOVIR HCL 1 G PO TABS: 2000.0000 mg | ORAL_TABLET | Freq: Every day | ORAL | 0 refills | Status: DC

## 2018-10-28 NOTE — Progress Notes (Unsigned)
Sent in valacyclovir to [facility]

## 2018-11-08 ENCOUNTER — Other Ambulatory Visit: Payer: Self-pay | Admitting: Family Medicine

## 2018-11-08 DIAGNOSIS — I10 Essential (primary) hypertension: Secondary | ICD-10-CM

## 2018-11-09 DIAGNOSIS — G4733 Obstructive sleep apnea (adult) (pediatric): Secondary | ICD-10-CM | POA: Diagnosis not present

## 2018-11-18 DIAGNOSIS — M1A00X0 Idiopathic chronic gout, unspecified site, without tophus (tophi): Secondary | ICD-10-CM | POA: Diagnosis not present

## 2018-11-19 ENCOUNTER — Other Ambulatory Visit: Payer: Self-pay | Admitting: Family Medicine

## 2018-11-19 DIAGNOSIS — G629 Polyneuropathy, unspecified: Secondary | ICD-10-CM

## 2018-12-02 ENCOUNTER — Other Ambulatory Visit: Payer: Self-pay | Admitting: Family Medicine

## 2018-12-02 DIAGNOSIS — I10 Essential (primary) hypertension: Secondary | ICD-10-CM

## 2018-12-26 ENCOUNTER — Other Ambulatory Visit: Payer: Self-pay | Admitting: Family Medicine

## 2018-12-26 DIAGNOSIS — G629 Polyneuropathy, unspecified: Secondary | ICD-10-CM

## 2018-12-27 ENCOUNTER — Other Ambulatory Visit: Payer: Self-pay

## 2018-12-28 ENCOUNTER — Other Ambulatory Visit: Payer: Self-pay | Admitting: Family Medicine

## 2018-12-28 DIAGNOSIS — I10 Essential (primary) hypertension: Secondary | ICD-10-CM

## 2019-01-03 ENCOUNTER — Other Ambulatory Visit: Payer: Self-pay | Admitting: Family Medicine

## 2019-01-03 DIAGNOSIS — M7989 Other specified soft tissue disorders: Secondary | ICD-10-CM | POA: Diagnosis not present

## 2019-01-03 DIAGNOSIS — M79642 Pain in left hand: Secondary | ICD-10-CM | POA: Diagnosis not present

## 2019-01-03 DIAGNOSIS — M19042 Primary osteoarthritis, left hand: Secondary | ICD-10-CM | POA: Diagnosis not present

## 2019-01-03 DIAGNOSIS — M151 Heberden's nodes (with arthropathy): Secondary | ICD-10-CM | POA: Diagnosis not present

## 2019-01-03 DIAGNOSIS — I10 Essential (primary) hypertension: Secondary | ICD-10-CM

## 2019-01-12 ENCOUNTER — Other Ambulatory Visit: Payer: Self-pay | Admitting: Family Medicine

## 2019-01-12 ENCOUNTER — Ambulatory Visit (INDEPENDENT_AMBULATORY_CARE_PROVIDER_SITE_OTHER): Payer: BC Managed Care – PPO | Admitting: Family Medicine

## 2019-01-12 ENCOUNTER — Encounter: Payer: Self-pay | Admitting: Family Medicine

## 2019-01-12 ENCOUNTER — Other Ambulatory Visit: Payer: Self-pay

## 2019-01-12 VITALS — BP 118/78 | HR 91 | Resp 16 | Ht 69.0 in | Wt 225.0 lb

## 2019-01-12 DIAGNOSIS — I10 Essential (primary) hypertension: Secondary | ICD-10-CM

## 2019-01-12 DIAGNOSIS — J301 Allergic rhinitis due to pollen: Secondary | ICD-10-CM

## 2019-01-12 MED ORDER — METOPROLOL TARTRATE 50 MG PO TABS: 50.0000 mg | ORAL_TABLET | Freq: Two times a day (BID) | ORAL | 0 refills | Status: DC

## 2019-01-12 MED ORDER — HYDROCHLOROTHIAZIDE 12.5 MG PO CAPS: ORAL_CAPSULE | ORAL | 1 refills | Status: DC

## 2019-01-12 MED ORDER — LISINOPRIL 20 MG PO TABS: 20.0000 mg | ORAL_TABLET | Freq: Every day | ORAL | 0 refills | Status: DC

## 2019-01-12 NOTE — Patient Instructions (Signed)

## 2019-01-12 NOTE — Progress Notes (Signed)
Date:  01/12/2019   Name:  Jerry Harmon   DOB:  1955/04/15   MRN:  [id number]   Chief Complaint: Allergic Reaction and Hypertension  Hypertension This is a chronic problem. The current episode started more than 1 year ago. The problem has been rapidly improving since onset. The problem is controlled. Pertinent negatives include no anxiety, blurred vision, chest pain, headaches, malaise/fatigue, neck pain, orthopnea, palpitations, peripheral edema, PND, shortness of breath or sweats. There are no associated agents to hypertension. There are no known risk factors for coronary artery disease. Past treatments include ACE inhibitors, diuretics and beta blockers. The current treatment provides moderate improvement. There are no compliance problems.  There is no history of angina, kidney disease, CAD/MI, CVA, heart failure, PVD or retinopathy. There is no history of chronic renal disease, a hypertension causing med or renovascular disease.    Review of Systems  Constitutional: Negative for chills, fever and malaise/fatigue.  HENT: Negative for drooling, ear discharge, ear pain and sore throat.   Eyes: Negative for blurred vision.  Respiratory: Negative for cough, shortness of breath and wheezing.   Cardiovascular: Negative for chest pain, palpitations, orthopnea, leg swelling and PND.  Gastrointestinal: Negative for abdominal pain, blood in stool, constipation, diarrhea and nausea.  Endocrine: Negative for polydipsia.  Genitourinary: Negative for dysuria, frequency, hematuria and urgency.  Musculoskeletal: Negative for back pain, myalgias and neck pain.  Skin: Negative for rash.  Allergic/Immunologic: Negative for environmental allergies.  Neurological: Negative for dizziness and headaches.  Hematological: Does not bruise/bleed easily.  Psychiatric/Behavioral: Negative for suicidal ideas. The patient is not nervous/anxious.     Patient Active Problem List   Diagnosis Date Noted  .  Adjustment disorder 03/27/2016  . Neuropathy 11/13/2015  . Allergic rhinitis due to pollen 11/13/2015  . BP (high blood pressure) 09/11/2014  . Cardiomyopathy ([place]) 09/11/2014  . Sleep apnea 09/11/2014  . Blood pressure elevated without history of HTN 09/11/2014  . Erectile dysfunction 09/11/2014  . Lump in testis 09/11/2014  . 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 Cancer of testis, seminoma ([place]) 12/01/2013  . Benign prostatic hyperplasia with urinary obstruction 11/19/2013    No Known Allergies  Past Surgical History:  Procedure Laterality Date  . HERNIA REPAIR    . RADICAL ORCHIECTOMY      Social History   Tobacco Use  . Smoking status: Former Research scientist (life sciences)  . Smokeless tobacco: Never Used  Substance Use Topics  . Alcohol use: No    Alcohol/week: 0.0 standard drinks  . Drug use: No     Medication list has been reviewed and updated.  Current Meds  Medication Sig  . allopurinol (ZYLOPRIM) 100 MG tablet Take 200 mg by mouth daily. ortho  . aspirin 81 MG tablet Take 1 tablet by mouth daily.  . hydrochlorothiazide (MICROZIDE) 12.5 MG capsule TAKE 1 CAPSULE BY MOUTH EVERY DAY  . ibuprofen (ADVIL,MOTRIN) 200 MG tablet Take 200 mg by mouth as needed.  Marland Kitchen lisinopril (ZESTRIL) 20 MG tablet TAKE 1 TABLET BY MOUTH EVERY DAY  . loratadine (CLARITIN) 10 MG tablet TAKE 1 TABLET BY MOUTH EVERY DAY  . metoprolol tartrate (LOPRESSOR) 50 MG tablet TAKE 1 TABLET BY MOUTH TWICE A DAY  . Multiple Vitamins-Minerals (CENTRUM SILVER ADULT 50+ PO) Take 1 tablet by mouth daily.  . mupirocin ointment (BACTROBAN) 2 % Place 1 application into the nose 2 (two) times daily.  . sildenafil (REVATIO) 20 MG tablet TAKE 2 -5 TABLETS BY MOUTH ONCE  DAILY AS NEEDED FOR SEXUAL ACTIVITY  . sulfacetamide (BLEPH-10) 10 % ophthalmic solution Place 1 drop into both eyes every 3 (three) hours.  . traMADol (ULTRAM) 50 MG tablet Take 1 tablet (50 mg total) by mouth every 12 (twelve) hours as needed  for moderate pain.  . valACYclovir (VALTREX) 1000 MG tablet Take 2 tablets (2,000 mg total) by mouth daily. 2 tablets today and 2 tomorrow.    PHQ 2/9 Scores 01/12/2019 01/08/2017 08/23/2015  PHQ - 2 Score 0 0 0  PHQ- 9 Score - 1 -    BP Readings from Last 3 Encounters:  01/12/19 118/78  10/19/18 120/60  08/06/18 134/88    Physical Exam Vitals signs and nursing note reviewed.  HENT:     Head: Normocephalic.     Right Ear: Tympanic membrane, ear canal and external ear normal. There is no impacted cerumen.     Left Ear: Tympanic membrane, ear canal and external ear normal. There is no impacted cerumen.     Nose: Nose normal. No congestion or rhinorrhea.  Eyes:     General: No scleral icterus.       Right eye: No discharge.        Left eye: No discharge.     Conjunctiva/sclera: Conjunctivae normal.     Pupils: Pupils are equal, round, and reactive to light.  Neck:     Musculoskeletal: Normal range of motion and neck supple.     Thyroid: No thyromegaly.     Vascular: No JVD.     Trachea: No tracheal deviation.  Cardiovascular:     Rate and Rhythm: Normal rate and regular rhythm.     Heart sounds: Normal heart sounds. No murmur. No friction rub. No gallop.   Pulmonary:     Effort: No respiratory distress.     Breath sounds: Normal breath sounds. No wheezing, rhonchi or rales.  Abdominal:     General: Bowel sounds are normal. There is no distension.     Palpations: Abdomen is soft. There is no mass.     Tenderness: There is no abdominal tenderness. There is no right CVA tenderness, left CVA tenderness, guarding or rebound.     Hernia: No hernia is present.  Musculoskeletal: Normal range of motion.        General: No tenderness.  Lymphadenopathy:     Cervical: No cervical adenopathy.  Skin:    General: Skin is warm.     Findings: No rash.  Neurological:     General: No focal deficit present.     Mental Status: He is alert and oriented to person, place, and time.      Cranial Nerves: No cranial nerve deficit.     Deep Tendon Reflexes: Reflexes are normal and symmetric.     Wt Readings from Last 3 Encounters:  01/12/19 225 lb (102.1 kg)  10/19/18 217 lb (98.4 kg)  08/06/18 214 lb (97.1 kg)    BP 118/78   Pulse 91   Resp 16   Ht 5\' 9"  (1.753 m)   Wt 225 lb (102.1 kg)   SpO2 97%   BMI 33.23 kg/m   Assessment and Plan: 1. Essential hypertension Chronic.  Controlled.  Continue hydrochlorothiazide 12.5 mg once a day and lisinopril 20 mg once a day as well as metoprolol 50 mg 1 tablet twice a day.  Will check renal function panel.  Will recheck in 6 months. - Renal Function Panel - Lipid panel - hydrochlorothiazide (MICROZIDE) 12.5 MG capsule; TAKE  1 CAPSULE BY MOUTH EVERY DAY  Dispense: 90 capsule; Refill: 1 - lisinopril (ZESTRIL) 20 MG tablet; Take 1 tablet (20 mg total) by mouth daily.  Dispense: 30 tablet; Refill: 0 - metoprolol tartrate (LOPRESSOR) 50 MG tablet; Take 1 tablet (50 mg total) by mouth 2 (two) times daily.  Dispense: 180 tablet; Refill: 0

## 2019-01-13 LAB — RENAL FUNCTION PANEL
Albumin: 4.6 g/dL (ref 3.8–4.8)
BUN/Creatinine Ratio: 21 (ref 10–24)
BUN: 23 mg/dL (ref 8–27)
CO2: 26 mmol/L (ref 20–29)
Calcium: 9.9 mg/dL (ref 8.6–10.2)
Chloride: 100 mmol/L (ref 96–106)
Creatinine, Ser: 1.07 mg/dL (ref 0.76–1.27)
GFR calc Af Amer: 86 mL/min/{1.73_m2} (ref >59)
GFR calc non Af Amer: 75 mL/min/{1.73_m2} (ref >59)
Glucose: 184 mg/dL — ABNORMAL HIGH (ref 65–99)
Phosphorus: 3.2 mg/dL (ref 2.8–4.1)
Potassium: 4.4 mmol/L (ref 3.5–5.2)
Sodium: 137 mmol/L (ref 134–144)

## 2019-01-13 LAB — LIPID PANEL
Chol/HDL Ratio: 3.8 ratio (ref 0.0–5.0)
Cholesterol, Total: 179 mg/dL (ref 100–199)
HDL: 47 mg/dL (ref >39)
LDL Chol Calc (NIH): 112 mg/dL — ABNORMAL HIGH (ref 0–99)
Triglycerides: 108 mg/dL (ref 0–149)
VLDL Cholesterol Cal: 20 mg/dL (ref 5–40)

## 2019-01-25 ENCOUNTER — Other Ambulatory Visit: Payer: Self-pay | Admitting: Family Medicine

## 2019-01-25 DIAGNOSIS — I10 Essential (primary) hypertension: Secondary | ICD-10-CM

## 2019-02-17 ENCOUNTER — Other Ambulatory Visit: Payer: Self-pay | Admitting: Family Medicine

## 2019-02-17 DIAGNOSIS — N528 Other male erectile dysfunction: Secondary | ICD-10-CM

## 2019-02-17 DIAGNOSIS — I10 Essential (primary) hypertension: Secondary | ICD-10-CM

## 2019-02-23 DIAGNOSIS — G4733 Obstructive sleep apnea (adult) (pediatric): Secondary | ICD-10-CM | POA: Diagnosis not present

## 2019-02-27 ENCOUNTER — Other Ambulatory Visit: Payer: Self-pay | Admitting: Family Medicine

## 2019-03-07 DIAGNOSIS — M13842 Other specified arthritis, left hand: Secondary | ICD-10-CM | POA: Diagnosis not present

## 2019-03-07 DIAGNOSIS — M79642 Pain in left hand: Secondary | ICD-10-CM | POA: Insufficient documentation

## 2019-03-08 ENCOUNTER — Other Ambulatory Visit: Payer: Self-pay | Admitting: Family Medicine

## 2019-03-08 DIAGNOSIS — J301 Allergic rhinitis due to pollen: Secondary | ICD-10-CM

## 2019-03-22 DIAGNOSIS — Z859 Personal history of malignant neoplasm, unspecified: Secondary | ICD-10-CM | POA: Diagnosis not present

## 2019-03-22 DIAGNOSIS — Z872 Personal history of diseases of the skin and subcutaneous tissue: Secondary | ICD-10-CM | POA: Diagnosis not present

## 2019-03-22 DIAGNOSIS — L578 Other skin changes due to chronic exposure to nonionizing radiation: Secondary | ICD-10-CM | POA: Diagnosis not present

## 2019-04-07 ENCOUNTER — Other Ambulatory Visit: Payer: Self-pay | Admitting: Family Medicine

## 2019-04-11 ENCOUNTER — Other Ambulatory Visit: Payer: Self-pay

## 2019-04-13 ENCOUNTER — Other Ambulatory Visit: Payer: Self-pay | Admitting: Family Medicine

## 2019-04-13 DIAGNOSIS — I10 Essential (primary) hypertension: Secondary | ICD-10-CM

## 2019-05-04 DIAGNOSIS — R7401 Elevation of levels of liver transaminase levels: Secondary | ICD-10-CM | POA: Diagnosis not present

## 2019-05-04 DIAGNOSIS — N401 Enlarged prostate with lower urinary tract symptoms: Secondary | ICD-10-CM | POA: Diagnosis not present

## 2019-05-04 DIAGNOSIS — C6291 Malignant neoplasm of right testis, unspecified whether descended or undescended: Secondary | ICD-10-CM | POA: Diagnosis not present

## 2019-05-04 DIAGNOSIS — C772 Secondary and unspecified malignant neoplasm of intra-abdominal lymph nodes: Secondary | ICD-10-CM | POA: Diagnosis not present

## 2019-05-04 DIAGNOSIS — C6290 Malignant neoplasm of unspecified testis, unspecified whether descended or undescended: Secondary | ICD-10-CM | POA: Diagnosis not present

## 2019-05-04 DIAGNOSIS — C3490 Malignant neoplasm of unspecified part of unspecified bronchus or lung: Secondary | ICD-10-CM | POA: Diagnosis not present

## 2019-05-04 DIAGNOSIS — Z6831 Body mass index (BMI) 31.0-31.9, adult: Secondary | ICD-10-CM | POA: Diagnosis not present

## 2019-05-05 LAB — CBC AND DIFFERENTIAL
HCT: 39 — AB (ref 41–53)
Hemoglobin: 13.3 — AB (ref 13.5–17.5)
Platelets: 191 (ref 150–399)
WBC: 5.8

## 2019-05-05 LAB — HEPATIC FUNCTION PANEL
ALT: 101 — AB (ref 10–40)
AST: 92 — AB (ref 14–40)
Alkaline Phosphatase: 70 (ref 25–125)

## 2019-05-05 LAB — PSA: PSA: 2.08

## 2019-05-05 LAB — BASIC METABOLIC PANEL
Creatinine: 0.9 (ref 0.6–1.3)
Glucose: 171

## 2019-05-05 LAB — CBC: RBC: 4.22 (ref 3.87–5.11)

## 2019-05-05 LAB — HEMOGLOBIN A1C: Hemoglobin A1C: 7.6

## 2019-05-06 ENCOUNTER — Other Ambulatory Visit: Payer: Self-pay

## 2019-05-14 ENCOUNTER — Other Ambulatory Visit: Payer: Self-pay | Admitting: Family Medicine

## 2019-05-14 DIAGNOSIS — I10 Essential (primary) hypertension: Secondary | ICD-10-CM

## 2019-05-24 DIAGNOSIS — G4733 Obstructive sleep apnea (adult) (pediatric): Secondary | ICD-10-CM | POA: Diagnosis not present

## 2019-05-30 ENCOUNTER — Other Ambulatory Visit: Payer: Self-pay | Admitting: Family Medicine

## 2019-05-30 DIAGNOSIS — J301 Allergic rhinitis due to pollen: Secondary | ICD-10-CM

## 2019-06-01 ENCOUNTER — Telehealth: Payer: Self-pay

## 2019-06-01 NOTE — Telephone Encounter (Signed)
Pt called and asked about the consultation with Dr Alice Reichert. I called [facility] and asked if this is a requirement for Dr Alice Reichert and was told that he does like for his pt's to see Reece Levy before having the colonoscopy procedure. I explained this to the pt and also advised him to stay within the [facility] group since his last colonoscopy was through Dr Candace Cruise. This way they will be able to compare results once colonoscopy is performed. Pt in agreement.

## 2019-06-21 DIAGNOSIS — Z86010 Personal history of colonic polyps: Secondary | ICD-10-CM | POA: Diagnosis not present

## 2019-06-21 DIAGNOSIS — R635 Abnormal weight gain: Secondary | ICD-10-CM | POA: Diagnosis not present

## 2019-06-21 DIAGNOSIS — K760 Fatty (change of) liver, not elsewhere classified: Secondary | ICD-10-CM | POA: Diagnosis not present

## 2019-06-21 DIAGNOSIS — R7401 Elevation of levels of liver transaminase levels: Secondary | ICD-10-CM | POA: Diagnosis not present

## 2019-06-27 ENCOUNTER — Ambulatory Visit: Payer: Self-pay | Attending: Internal Medicine

## 2019-06-27 DIAGNOSIS — Z23 Encounter for immunization: Secondary | ICD-10-CM

## 2019-06-27 NOTE — Progress Notes (Signed)
   Covid-19 Vaccination Clinic  Name:  Dayten Felland    MRN: [id number] DOB: July 24, 1955  06/27/2019  Mr. Laguardia was observed post Covid-19 immunization for 15 minutes without incident. He was provided with Vaccine Information Sheet and instruction to access the V-Safe system.   Mr. Goettl was instructed to call 911 with any severe reactions post vaccine: Marland Kitchen Difficulty breathing  . Swelling of face and throat  . A fast heartbeat  . A bad rash all over body  . Dizziness and weakness   Immunizations Administered    Name Date Dose VIS Date Route   Pfizer COVID-19 Vaccine 06/27/2019 12:20 PM 0.3 mL 03/18/2019 Intramuscular   Manufacturer: [place]   Lot: [id number]   Alpha: [id number]

## 2019-06-29 ENCOUNTER — Other Ambulatory Visit: Payer: Self-pay | Admitting: Family Medicine

## 2019-07-01 ENCOUNTER — Encounter: Payer: Self-pay | Admitting: Family Medicine

## 2019-07-01 ENCOUNTER — Other Ambulatory Visit: Payer: Self-pay

## 2019-07-01 ENCOUNTER — Ambulatory Visit (INDEPENDENT_AMBULATORY_CARE_PROVIDER_SITE_OTHER): Payer: BC Managed Care – PPO | Admitting: Family Medicine

## 2019-07-01 VITALS — BP 120/80 | HR 80 | Ht 69.0 in | Wt 224.0 lb

## 2019-07-01 DIAGNOSIS — R7401 Elevation of levels of liver transaminase levels: Secondary | ICD-10-CM

## 2019-07-01 DIAGNOSIS — N528 Other male erectile dysfunction: Secondary | ICD-10-CM

## 2019-07-01 DIAGNOSIS — B001 Herpesviral vesicular dermatitis: Secondary | ICD-10-CM | POA: Diagnosis not present

## 2019-07-01 DIAGNOSIS — G629 Polyneuropathy, unspecified: Secondary | ICD-10-CM

## 2019-07-01 DIAGNOSIS — H6121 Impacted cerumen, right ear: Secondary | ICD-10-CM

## 2019-07-01 DIAGNOSIS — I10 Essential (primary) hypertension: Secondary | ICD-10-CM | POA: Diagnosis not present

## 2019-07-01 DIAGNOSIS — J301 Allergic rhinitis due to pollen: Secondary | ICD-10-CM

## 2019-07-01 MED ORDER — PREGABALIN 100 MG PO CAPS: 100.0000 mg | ORAL_CAPSULE | Freq: Two times a day (BID) | ORAL | 5 refills | Status: DC

## 2019-07-01 MED ORDER — LORATADINE 10 MG PO TABS: 10.0000 mg | ORAL_TABLET | Freq: Every day | ORAL | 0 refills | Status: DC

## 2019-07-01 MED ORDER — METOPROLOL TARTRATE 50 MG PO TABS: 50.0000 mg | ORAL_TABLET | Freq: Two times a day (BID) | ORAL | 1 refills | Status: DC

## 2019-07-01 MED ORDER — SILDENAFIL CITRATE 20 MG PO TABS: ORAL_TABLET | ORAL | 5 refills | Status: DC

## 2019-07-01 MED ORDER — LISINOPRIL 20 MG PO TABS: 20.0000 mg | ORAL_TABLET | Freq: Every day | ORAL | 1 refills | Status: DC

## 2019-07-01 MED ORDER — VALACYCLOVIR HCL 1 G PO TABS: 2000.0000 mg | ORAL_TABLET | Freq: Every day | ORAL | 0 refills | Status: DC

## 2019-07-01 MED ORDER — HYDROCHLOROTHIAZIDE 12.5 MG PO CAPS: ORAL_CAPSULE | ORAL | 1 refills | Status: DC

## 2019-07-01 NOTE — Addendum Note (Signed)
Addended by: Juline Patch on: 07/01/2019 11:32 AM   Modules accepted: Orders

## 2019-07-01 NOTE — Progress Notes (Signed)
Date:  07/01/2019   Name:  Jerry Harmon   DOB:  1955/11/30   MRN:  [id number]   Chief Complaint: Hypertension, Allergic Rhinitis , Erectile Dysfunction, Peripheral Neuropathy (due to chemotherapy), Mouth Lesions, and Ear Problem (feels a heart beat in ear)  Hypertension This is a chronic problem. The current episode started more than 1 year ago. The problem has been gradually improving since onset. The problem is controlled. Pertinent negatives include no anxiety, blurred vision, chest pain, headaches, malaise/fatigue, neck pain, orthopnea, palpitations, peripheral edema, PND, shortness of breath or sweats. There are no associated agents to hypertension. Risk factors for coronary artery disease include dyslipidemia and male gender. Past treatments include beta blockers, ACE inhibitors and diuretics. The current treatment provides moderate improvement. There are no compliance problems.  There is no history of angina, kidney disease, CAD/MI, CVA, heart failure, left ventricular hypertrophy, PVD or retinopathy. There is no history of chronic renal disease, a hypertension causing med or renovascular disease.  Erectile Dysfunction This is a chronic problem. The current episode started more than 1 year ago. The problem has been waxing and waning since onset. The nature of his difficulty is achieving erection. He reports no anxiety. Irritative symptoms do not include frequency or urgency. Obstructive symptoms do not include dribbling, incomplete emptying, an intermittent stream, a slower stream, straining or a weak stream. Pertinent negatives include no chills, dysuria or hematuria. Risk factors include hypertension.  Mouth Lesions  The onset was sudden. The problem occurs occasionally. Associated symptoms include mouth sores. Pertinent negatives include no orthopnea, no fever, no abdominal pain, no constipation, no diarrhea, no nausea, no ear discharge, no ear pain, no headaches, no sore throat, no neck  pain, no cough, no wheezing and no rash.  Neurologic Problem The patient's primary symptoms include clumsiness. The patient's pertinent negatives include no altered mental status, focal sensory loss, focal weakness, loss of balance, memory loss, near-syncope, slurred speech, syncope, visual change or weakness. Primary symptoms comment: neuropathy due to chemotherapy. This is a chronic problem. The current episode started more than 1 year ago. The problem has been waxing and waning since onset. Pertinent negatives include no abdominal pain, back pain, chest pain, dizziness, fever, headaches, nausea, neck pain, palpitations or shortness of breath.    Lab Results  Component Value Date   CREATININE 0.9 05/05/2019   BUN 23 01/12/2019   NA 137 01/12/2019   K 4.4 01/12/2019   CL 100 01/12/2019   CO2 26 01/12/2019   Lab Results  Component Value Date   CHOL 179 01/12/2019   HDL 47 01/12/2019   LDLCALC 112 (H) 01/12/2019   TRIG 108 01/12/2019   CHOLHDL 3.8 01/12/2019   Lab Results  Component Value Date   TSH 2.600 03/12/2015   Lab Results  Component Value Date   HGBA1C 7.6 05/05/2019   Lab Results  Component Value Date   WBC 5.8 05/05/2019   HGB 13.3 (A) 05/05/2019   HCT 39 (A) 05/05/2019   PLT 191 05/05/2019   Lab Results  Component Value Date   ALT 101 (A) 05/05/2019   AST 92 (A) 05/05/2019   ALKPHOS 70 05/05/2019     Review of Systems  Constitutional: Negative for chills, fever and malaise/fatigue.  HENT: Positive for mouth sores. Negative for drooling, ear discharge, ear pain and sore throat.   Eyes: Negative for blurred vision.  Respiratory: Negative for cough, shortness of breath and wheezing.   Cardiovascular: Negative for chest pain, palpitations, orthopnea,  leg swelling, PND and near-syncope.  Gastrointestinal: Negative for abdominal pain, blood in stool, constipation, diarrhea and nausea.  Endocrine: Negative for polydipsia.  Genitourinary: Negative for dysuria,  frequency, hematuria, incomplete emptying and urgency.  Musculoskeletal: Negative for back pain, myalgias and neck pain.  Skin: Negative for rash.  Allergic/Immunologic: Negative for environmental allergies.  Neurological: Negative for dizziness, focal weakness, syncope, weakness, headaches and loss of balance.  Hematological: Does not bruise/bleed easily.  Psychiatric/Behavioral: Negative for memory loss and suicidal ideas. The patient is not nervous/anxious.     Patient Active Problem List   Diagnosis Date Noted  . Adjustment disorder 03/27/2016  . Neuropathy 11/13/2015  . Allergic rhinitis due to pollen 11/13/2015  . BP (high blood pressure) 09/11/2014  . Cardiomyopathy ([place]) 09/11/2014  . Sleep apnea 09/11/2014  . Blood pressure elevated without history of HTN 09/11/2014  . Erectile dysfunction 09/11/2014  . Lump in testis 09/11/2014  . 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 Cancer of testis, seminoma ([place]) 12/01/2013  . Benign prostatic hyperplasia with urinary obstruction 11/19/2013    No Known Allergies  Past Surgical History:  Procedure Laterality Date  . HERNIA REPAIR    . RADICAL ORCHIECTOMY      Social History   Tobacco Use  . Smoking status: Former Research scientist (life sciences)  . Smokeless tobacco: Never Used  Substance Use Topics  . Alcohol use: No    Alcohol/week: 0.0 standard drinks  . Drug use: No     Medication list has been reviewed and updated.  Current Meds  Medication Sig  . allopurinol (ZYLOPRIM) 100 MG tablet Take 200 mg by mouth daily. ortho  . aspirin 81 MG tablet Take 1 tablet by mouth daily.  . hydrochlorothiazide (MICROZIDE) 12.5 MG capsule TAKE 1 CAPSULE BY MOUTH EVERY DAY  . ibuprofen (ADVIL,MOTRIN) 200 MG tablet Take 200 mg by mouth as needed.  Marland Kitchen lisinopril (ZESTRIL) 20 MG tablet TAKE 1 TABLET BY MOUTH EVERY DAY  . loratadine (CLARITIN) 10 MG tablet TAKE 1 TABLET BY MOUTH EVERY DAY  . metoprolol tartrate (LOPRESSOR) 50 MG tablet  TAKE 1 TABLET BY MOUTH TWICE A DAY  . Multiple Vitamins-Minerals (CENTRUM SILVER ADULT 50+ PO) Take 1 tablet by mouth daily.  . mupirocin ointment (BACTROBAN) 2 % Place 1 application into the nose 2 (two) times daily.  . pregabalin (LYRICA) 100 MG capsule Take 100 mg by mouth 2 (two) times daily.  . sildenafil (REVATIO) 20 MG tablet TAKE 2 -5 TABLETS BY MOUTH ONCE DAILY AS NEEDED FOR SEXUAL ACTIVITY  . traMADol (ULTRAM) 50 MG tablet Take 1 tablet (50 mg total) by mouth every 12 (twelve) hours as needed for moderate pain.  . valACYclovir (VALTREX) 1000 MG tablet Take 2 tablets (2,000 mg total) by mouth daily. 2 tablets today and 2 tomorrow.  . [DISCONTINUED] sulfacetamide (BLEPH-10) 10 % ophthalmic solution Place 1 drop into both eyes every 3 (three) hours.    PHQ 2/9 Scores 07/01/2019 01/12/2019 01/08/2017 08/23/2015  PHQ - 2 Score 0 0 0 0  PHQ- 9 Score 0 - 1 -    BP Readings from Last 3 Encounters:  07/01/19 120/80  01/12/19 118/78  10/19/18 120/60    Physical Exam Vitals and nursing note reviewed.  HENT:     Head: Normocephalic.     Right Ear: Tympanic membrane, ear canal and external ear normal. There is impacted cerumen.     Left Ear: Tympanic membrane, ear canal and external ear normal.  Nose: Nose normal. No congestion or rhinorrhea.  Eyes:     General: No scleral icterus.       Right eye: No discharge.        Left eye: No discharge.     Conjunctiva/sclera: Conjunctivae normal.     Pupils: Pupils are equal, round, and reactive to light.  Neck:     Thyroid: No thyromegaly.     Vascular: No JVD.     Trachea: No tracheal deviation.  Cardiovascular:     Rate and Rhythm: Normal rate and regular rhythm.     Pulses: Normal pulses.     Heart sounds: Normal heart sounds. No murmur. No friction rub. No gallop.   Pulmonary:     Effort: No respiratory distress.     Breath sounds: Normal breath sounds. No wheezing, rhonchi or rales.  Abdominal:     General: Bowel sounds are  normal.     Palpations: Abdomen is soft. There is no mass.     Tenderness: There is no abdominal tenderness. There is no right CVA tenderness, left CVA tenderness, guarding or rebound.  Musculoskeletal:        General: No tenderness. Normal range of motion.     Cervical back: Normal range of motion and neck supple.  Lymphadenopathy:     Cervical: No cervical adenopathy.  Skin:    General: Skin is warm.     Capillary Refill: Capillary refill takes less than 2 seconds.     Findings: No rash.  Neurological:     Mental Status: He is alert and oriented to person, place, and time.     Cranial Nerves: No cranial nerve deficit.     Deep Tendon Reflexes: Reflexes are normal and symmetric.     Wt Readings from Last 3 Encounters:  07/01/19 224 lb (101.6 kg)  01/12/19 225 lb (102.1 kg)  10/19/18 217 lb (98.4 kg)    BP 120/80   Pulse 80   Ht 5\' 9"  (1.753 m)   Wt 224 lb (101.6 kg)   BMI 33.08 kg/m   Assessment and Plan:  1. Essential hypertension Chronic.  Controlled.  Stable.  Continue hydrochlorothiazide 12.5 mg once a day, lisinopril 20 mg once a day, metoprolol 50 mg 1 twice a day.  Reviewed previous renal function panel - hydrochlorothiazide (MICROZIDE) 12.5 MG capsule; TAKE 1 CAPSULE BY MOUTH EVERY DAY  Dispense: 90 capsule; Refill: 1 - lisinopril (ZESTRIL) 20 MG tablet; Take 1 tablet (20 mg total) by mouth daily.  Dispense: 90 tablet; Refill: 1 - metoprolol tartrate (LOPRESSOR) 50 MG tablet; Take 1 tablet (50 mg total) by mouth 2 (two) times daily.  Dispense: 180 tablet; Refill: 1  2. Seasonal allergic rhinitis due to pollen Chronic.  Controlled.  Episodic.  Continue loratadine 10 mg once a day. - loratadine (CLARITIN) 10 MG tablet; Take 1 tablet (10 mg total) by mouth daily.  Dispense: 90 tablet; Refill: 0  3. Other male erectile dysfunction Chronic.  Episodic.  Persistent.  Refill sildenafil 20 mg 1 tablet as needed prior to activity. - sildenafil (REVATIO) 20 MG tablet; One  tablet as needed before sexual activity  Dispense: 50 tablet; Refill: 5  4. Fever blister Chronic.  Episodic.  Controlled.  Refill valacyclovir 1 g 2 tablets a day followed by repeat 2 tablets the following day. - valACYclovir (VALTREX) 1000 MG tablet; Take 2 tablets (2,000 mg total) by mouth daily. 2 tablets today and 2 tomorrow.  Dispense: 10 tablet; Refill: 0  5.  Impacted cerumen of right ear Acute.  Patient with an impaction of cerumen in the right ear.  Patient has hearing heartbeat because of the noncompeting noise allows temporal artery to transmit pulse.  Patient underwent irrigation of the right ear with irrigation apparatus and warm water and cerumen was dislodged and removed with curette.  6. Neuropathy Chronic.  Persistent.  Partially controlled.  Continue Lyrica 100 mg twice a day.  7. Elevated transaminase level New onset.  Last hepatic function panel noted elevation of SGOT and SGPT.  We will repeat hepatic function panel since patient has stated that he has quit using alcohol. - Hepatic Function Panel (6)

## 2019-07-02 LAB — HEPATIC FUNCTION PANEL (6)
ALT: 63 IU/L — ABNORMAL HIGH (ref 0–44)
AST: 45 IU/L — ABNORMAL HIGH (ref 0–40)
Albumin: 4.4 g/dL (ref 3.8–4.8)
Alkaline Phosphatase: 63 IU/L (ref 39–117)
Bilirubin Total: 0.3 mg/dL (ref 0.0–1.2)
Bilirubin, Direct: 0.13 mg/dL (ref 0.00–0.40)

## 2019-07-04 ENCOUNTER — Other Ambulatory Visit: Payer: Self-pay

## 2019-07-04 DIAGNOSIS — R748 Abnormal levels of other serum enzymes: Secondary | ICD-10-CM

## 2019-07-04 NOTE — Progress Notes (Unsigned)
Placed US order

## 2019-07-11 ENCOUNTER — Ambulatory Visit
Admission: RE | Admit: 2019-07-11 | Discharge: 2019-07-11 | Disposition: A | Discharge status: Home / Self Care | Payer: BC Managed Care – PPO | Source: Ambulatory Visit | Attending: Family Medicine | Admitting: Family Medicine

## 2019-07-11 ENCOUNTER — Other Ambulatory Visit: Payer: Self-pay

## 2019-07-11 ENCOUNTER — Ambulatory Visit: Payer: BC Managed Care – PPO

## 2019-07-11 DIAGNOSIS — R748 Abnormal levels of other serum enzymes: Secondary | ICD-10-CM | POA: Insufficient documentation

## 2019-07-11 DIAGNOSIS — K7689 Other specified diseases of liver: Secondary | ICD-10-CM | POA: Diagnosis not present

## 2019-07-11 DIAGNOSIS — K760 Fatty (change of) liver, not elsewhere classified: Secondary | ICD-10-CM | POA: Diagnosis not present

## 2019-07-12 ENCOUNTER — Other Ambulatory Visit: Payer: Self-pay

## 2019-07-12 DIAGNOSIS — R05 Cough: Secondary | ICD-10-CM

## 2019-07-12 DIAGNOSIS — R059 Cough, unspecified: Secondary | ICD-10-CM

## 2019-07-12 MED ORDER — BENZONATATE 200 MG PO CAPS: 200.0000 mg | ORAL_CAPSULE | Freq: Two times a day (BID) | ORAL | 0 refills | Status: DC | PRN

## 2019-07-20 ENCOUNTER — Ambulatory Visit: Payer: Self-pay

## 2019-07-26 ENCOUNTER — Ambulatory Visit: Payer: BC Managed Care – PPO

## 2019-08-02 ENCOUNTER — Other Ambulatory Visit
Admission: RE | Admit: 2019-08-02 | Discharge: 2019-08-02 | Disposition: A | Discharge status: Home / Self Care | Payer: BC Managed Care – PPO | Source: Ambulatory Visit | Attending: Internal Medicine | Admitting: Internal Medicine

## 2019-08-02 ENCOUNTER — Ambulatory Visit: Payer: BC Managed Care – PPO | Attending: Internal Medicine

## 2019-08-02 DIAGNOSIS — Z20822 Contact with and (suspected) exposure to covid-19: Secondary | ICD-10-CM | POA: Diagnosis not present

## 2019-08-02 DIAGNOSIS — Z01812 Encounter for preprocedural laboratory examination: Secondary | ICD-10-CM | POA: Diagnosis not present

## 2019-08-02 DIAGNOSIS — Z23 Encounter for immunization: Secondary | ICD-10-CM

## 2019-08-02 LAB — SARS CORONAVIRUS 2 (TAT 6-24 HRS): SARS Coronavirus 2: NEGATIVE

## 2019-08-02 NOTE — Progress Notes (Signed)
   Covid-19 Vaccination Clinic  Name:  Keli Loy    MRN: [id number] DOB: 1956-02-27  08/02/2019  Mr. Drost was observed post Covid-19 immunization for 15 minutes without incident. He was provided with Vaccine Information Sheet and instruction to access the V-Safe system.   Mr. Ferraris was instructed to call 911 with any severe reactions post vaccine: Marland Kitchen Difficulty breathing  . Swelling of face and throat  . A fast heartbeat  . A bad rash all over body  . Dizziness and weakness   Immunizations Administered    Name Date Dose VIS Date Route   Pfizer COVID-19 Vaccine 08/02/2019 10:26 AM 0.3 mL 06/01/2018 Intramuscular   Manufacturer: [employer]   Lot: [id number]   [place]in[place]: [id number]

## 2019-08-05 DIAGNOSIS — Z86010 Personal history of colonic polyps: Secondary | ICD-10-CM | POA: Diagnosis not present

## 2019-08-05 DIAGNOSIS — K5730 Diverticulosis of large intestine without perforation or abscess without bleeding: Secondary | ICD-10-CM | POA: Diagnosis not present

## 2019-08-05 DIAGNOSIS — I10 Essential (primary) hypertension: Secondary | ICD-10-CM | POA: Diagnosis not present

## 2019-08-05 DIAGNOSIS — K640 First degree hemorrhoids: Secondary | ICD-10-CM | POA: Diagnosis not present

## 2019-08-22 DIAGNOSIS — G4733 Obstructive sleep apnea (adult) (pediatric): Secondary | ICD-10-CM | POA: Diagnosis not present

## 2019-08-25 DIAGNOSIS — Z859 Personal history of malignant neoplasm, unspecified: Secondary | ICD-10-CM | POA: Diagnosis not present

## 2019-08-25 DIAGNOSIS — Z872 Personal history of diseases of the skin and subcutaneous tissue: Secondary | ICD-10-CM | POA: Diagnosis not present

## 2019-08-25 DIAGNOSIS — L821 Other seborrheic keratosis: Secondary | ICD-10-CM | POA: Diagnosis not present

## 2019-08-25 DIAGNOSIS — L578 Other skin changes due to chronic exposure to nonionizing radiation: Secondary | ICD-10-CM | POA: Diagnosis not present

## 2019-09-06 ENCOUNTER — Other Ambulatory Visit: Payer: Self-pay | Admitting: Family Medicine

## 2019-09-06 DIAGNOSIS — J301 Allergic rhinitis due to pollen: Secondary | ICD-10-CM

## 2019-11-21 DIAGNOSIS — G4733 Obstructive sleep apnea (adult) (pediatric): Secondary | ICD-10-CM | POA: Diagnosis not present

## 2019-11-25 ENCOUNTER — Other Ambulatory Visit: Payer: Self-pay | Admitting: Family Medicine

## 2019-11-25 DIAGNOSIS — N528 Other male erectile dysfunction: Secondary | ICD-10-CM

## 2019-12-23 ENCOUNTER — Other Ambulatory Visit: Payer: Self-pay | Admitting: Family Medicine

## 2019-12-23 DIAGNOSIS — N528 Other male erectile dysfunction: Secondary | ICD-10-CM

## 2019-12-23 NOTE — Telephone Encounter (Signed)
Requested Prescriptions  Pending Prescriptions Disp Refills  . sildenafil (REVATIO) 20 MG tablet [Pharmacy Med Name: SILDENAFIL 20 MG TABLET] 50 tablet 0    Sig: ONE TABLET AS NEEDED BEFORE SEXUAL ACTIVITY     Urology: Erectile Dysfunction Agents Passed - 12/23/2019  2:30 PM      Passed - Last BP in normal range    BP Readings from Last 1 Encounters:  07/01/19 120/80         Passed - Valid encounter within last 12 months    Recent Outpatient Visits          5 months ago Essential hypertension   [place], Deanna C, MD   11 months ago Essential hypertension   [facility] Juline Patch, MD   1 year ago Acute bacterial conjunctivitis of right eye   [facility] Juline Patch, MD   1 year ago Avulsion, finger tip, initial encounter   [facility] Juline Patch, MD   1 year ago Essential hypertension   [facility] Juline Patch, MD

## 2020-01-01 ENCOUNTER — Other Ambulatory Visit: Payer: Self-pay | Admitting: Family Medicine

## 2020-01-01 NOTE — Telephone Encounter (Signed)
Requested medication (s) are due for refill today: yes  Requested medication (s) are on the active medication list: yes  Last refill:  07/01/19  Future visit scheduled: no  Notes to clinic:  med not delegated to NT to RF   Requested Prescriptions  Pending Prescriptions Disp Refills   pregabalin (LYRICA) 100 MG capsule [Pharmacy Med Name: PREGABALIN 100 MG CAPSULE] 60 capsule     Sig: TAKE 1 CAPSULE BY MOUTH TWICE A DAY      Not Delegated - Neurology:  Anticonvulsants - Controlled Failed - 01/01/2020 11:13 AM      Failed - This refill cannot be delegated      Passed - Valid encounter within last 12 months    Recent Outpatient Visits           6 months ago Essential hypertension   [place], Deanna C, MD   11 months ago Essential hypertension   [facility] Juline Patch, MD   1 year ago Acute bacterial conjunctivitis of right eye   [facility] Juline Patch, MD   1 year ago Avulsion, finger tip, initial encounter   [facility] Juline Patch, MD   1 year ago Essential hypertension   [facility] Juline Patch, MD

## 2020-01-02 ENCOUNTER — Other Ambulatory Visit: Payer: Self-pay | Admitting: Family Medicine

## 2020-01-02 DIAGNOSIS — I10 Essential (primary) hypertension: Secondary | ICD-10-CM

## 2020-01-02 NOTE — Telephone Encounter (Signed)
Requested Prescriptions  Pending Prescriptions Disp Refills  . hydrochlorothiazide (MICROZIDE) 12.5 MG capsule [Pharmacy Med Name: HYDROCHLOROTHIAZIDE 12.5 MG CP] 30 capsule 0    Sig: TAKE 1 CAPSULE BY MOUTH EVERY DAY     Cardiovascular: Diuretics - Thiazide Failed - 01/02/2020  1:32 AM      Failed - Valid encounter within last 6 months    Recent Outpatient Visits          6 months ago Essential hypertension   [facility] Juline Patch, MD   11 months ago Essential hypertension   [facility] Juline Patch, MD   1 year ago Acute bacterial conjunctivitis of right eye   [facility] Juline Patch, MD   1 year ago Avulsion, finger tip, initial encounter   [facility] Juline Patch, MD   1 year ago Essential hypertension   [facility] Juline Patch, MD      Future Appointments            In 3 days Juline Patch, MD [facility] [place] in normal range and within 360 days    Calcium  Date Value Ref Range Status  01/12/2019 9.9 8.6 - 10.2 mg/dL Final         Passed - Cr in normal range and within 360 days    Creatinine  Date Value Ref Range Status  05/05/2019 0.9 0.6 - 1.3 Final   Creatinine, Ser  Date Value Ref Range Status  01/12/2019 1.07 0.76 - 1.27 mg/dL Final         Passed - K in normal range and within 360 days    Potassium  Date Value Ref Range Status  01/12/2019 4.4 3.5 - 5.2 mmol/L Final         Passed - Na in normal range and within 360 days    Sodium  Date Value Ref Range Status  01/12/2019 137 134 - 144 mmol/L Final         Passed - Last BP in normal range    BP Readings from Last 1 Encounters:  07/01/19 120/80

## 2020-01-05 ENCOUNTER — Other Ambulatory Visit: Payer: Self-pay

## 2020-01-05 ENCOUNTER — Encounter: Payer: Self-pay | Admitting: Family Medicine

## 2020-01-05 ENCOUNTER — Ambulatory Visit (INDEPENDENT_AMBULATORY_CARE_PROVIDER_SITE_OTHER): Payer: BC Managed Care – PPO | Admitting: Family Medicine

## 2020-01-05 VITALS — BP 120/60 | HR 72 | Ht 69.0 in | Wt 225.0 lb

## 2020-01-05 DIAGNOSIS — N528 Other male erectile dysfunction: Secondary | ICD-10-CM | POA: Diagnosis not present

## 2020-01-05 DIAGNOSIS — Z23 Encounter for immunization: Secondary | ICD-10-CM

## 2020-01-05 DIAGNOSIS — B001 Herpesviral vesicular dermatitis: Secondary | ICD-10-CM | POA: Diagnosis not present

## 2020-01-05 DIAGNOSIS — G629 Polyneuropathy, unspecified: Secondary | ICD-10-CM | POA: Diagnosis not present

## 2020-01-05 DIAGNOSIS — R7309 Other abnormal glucose: Secondary | ICD-10-CM

## 2020-01-05 DIAGNOSIS — I10 Essential (primary) hypertension: Secondary | ICD-10-CM

## 2020-01-05 DIAGNOSIS — R748 Abnormal levels of other serum enzymes: Secondary | ICD-10-CM

## 2020-01-05 MED ORDER — HYDROCHLOROTHIAZIDE 12.5 MG PO CAPS: 12.5000 mg | ORAL_CAPSULE | Freq: Every day | ORAL | 1 refills | Status: DC

## 2020-01-05 MED ORDER — METOPROLOL TARTRATE 50 MG PO TABS: 50.0000 mg | ORAL_TABLET | Freq: Two times a day (BID) | ORAL | 1 refills | Status: DC

## 2020-01-05 MED ORDER — VALACYCLOVIR HCL 1 G PO TABS: 2000.0000 mg | ORAL_TABLET | Freq: Every day | ORAL | 1 refills | Status: DC

## 2020-01-05 MED ORDER — LISINOPRIL 20 MG PO TABS: 20.0000 mg | ORAL_TABLET | Freq: Every day | ORAL | 1 refills | Status: DC

## 2020-01-05 MED ORDER — SILDENAFIL CITRATE 20 MG PO TABS: ORAL_TABLET | ORAL | 0 refills | Status: DC

## 2020-01-05 MED ORDER — PREGABALIN 100 MG PO CAPS: 100.0000 mg | ORAL_CAPSULE | Freq: Two times a day (BID) | ORAL | 5 refills | Status: DC

## 2020-01-05 NOTE — Progress Notes (Signed)
Date:  01/05/2020   Name:  Jerry Harmon   DOB:  November 27, 1955   MRN:  [id number]   Chief Complaint: Hypertension, Allergic Rhinitis , Erectile Dysfunction, Peripheral Neuropathy, fever blisters, and Flu Vaccine  Hypertension This is a chronic problem. The current episode started more than 1 year ago. The problem has been gradually improving since onset. The problem is controlled. Pertinent negatives include no anxiety, blurred vision, chest pain, headaches, malaise/fatigue, neck pain, orthopnea, palpitations, peripheral edema, PND, shortness of breath or sweats. There are no associated agents to hypertension. There are no known risk factors for coronary artery disease. Past treatments include beta blockers, ACE inhibitors and diuretics. The current treatment provides moderate improvement. There are no compliance problems.  There is no history of angina, kidney disease, CAD/MI, CVA, heart failure, left ventricular hypertrophy, PVD or retinopathy. There is no history of chronic renal disease, a hypertension causing med or renovascular disease.  Erectile Dysfunction This is a chronic problem. The current episode started more than 1 year ago. The problem has been gradually improving since onset. The nature of his difficulty is achieving erection, maintaining erection and penetration. He reports no anxiety. Irritative symptoms do not include frequency or urgency. Obstructive symptoms do not include dribbling, incomplete emptying, an intermittent stream, a slower stream, straining or a weak stream. Pertinent negatives include no chills, dysuria or hematuria. Past treatments include sildenafil. The treatment provided moderate relief. Risk factors include hypertension.  Neurologic Problem The patient's primary symptoms include focal sensory loss and a loss of balance. The patient's pertinent negatives include no altered mental status, clumsiness, focal weakness, memory loss, near-syncope, slurred speech,  syncope, visual change or weakness. This is a chronic problem. The current episode started more than 1 year ago. The problem has been gradually improving since onset. There was no focality noted. Pertinent negatives include no abdominal pain, back pain, chest pain, dizziness, fever, headaches, nausea, neck pain, palpitations or shortness of breath. The treatment provided moderate relief. There is no history of a bleeding disorder, a clotting disorder, a CVA, dementia, head trauma, liver disease, mood changes or seizures.    Lab Results  Component Value Date   CREATININE 0.9 05/05/2019   BUN 23 01/12/2019   NA 137 01/12/2019   K 4.4 01/12/2019   CL 100 01/12/2019   CO2 26 01/12/2019   Lab Results  Component Value Date   CHOL 179 01/12/2019   HDL 47 01/12/2019   LDLCALC 112 (H) 01/12/2019   TRIG 108 01/12/2019   CHOLHDL 3.8 01/12/2019   Lab Results  Component Value Date   TSH 2.600 03/12/2015   Lab Results  Component Value Date   HGBA1C 7.6 05/05/2019   Lab Results  Component Value Date   WBC 5.8 05/05/2019   HGB 13.3 (A) 05/05/2019   HCT 39 (A) 05/05/2019   PLT 191 05/05/2019   Lab Results  Component Value Date   ALT 63 (H) 07/01/2019   AST 45 (H) 07/01/2019   ALKPHOS 63 07/01/2019   BILITOT 0.3 07/01/2019     Review of Systems  Constitutional: Negative for chills, fever and malaise/fatigue.  HENT: Negative for drooling, ear discharge, ear pain and sore throat.   Eyes: Negative for blurred vision.  Respiratory: Negative for cough, shortness of breath and wheezing.   Cardiovascular: Negative for chest pain, palpitations, orthopnea, leg swelling, PND and near-syncope.  Gastrointestinal: Negative for abdominal pain, blood in stool, constipation, diarrhea and nausea.  Endocrine: Negative for polydipsia.  Genitourinary:  Negative for dysuria, frequency, hematuria, incomplete emptying and urgency.  Musculoskeletal: Negative for back pain, myalgias and neck pain.  Skin:  Negative for rash.  Allergic/Immunologic: Negative for environmental allergies.  Neurological: Positive for loss of balance. Negative for dizziness, focal weakness, syncope, weakness and headaches.  Hematological: Does not bruise/bleed easily.  Psychiatric/Behavioral: Negative for memory loss and suicidal ideas. The patient is not nervous/anxious.     Patient Active Problem List   Diagnosis Date Noted  . Fever blister 07/01/2019  . Adjustment disorder 03/27/2016  . Neuropathy 11/13/2015  . Allergic rhinitis due to pollen 11/13/2015  . BP (high blood pressure) 09/11/2014  . Cardiomyopathy ([place]) 09/11/2014  . Sleep apnea 09/11/2014  . Blood pressure elevated without history of HTN 09/11/2014  . Erectile dysfunction 09/11/2014  . Lump in testis 09/11/2014  . 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 Cancer of testis, seminoma ([place]) 12/01/2013  . Benign prostatic hyperplasia with urinary obstruction 11/19/2013    No Known Allergies  Past Surgical History:  Procedure Laterality Date  . HERNIA REPAIR    . RADICAL ORCHIECTOMY      Social History   Tobacco Use  . Smoking status: Former Research scientist (life sciences)  . Smokeless tobacco: Never Used  Substance Use Topics  . Alcohol use: No    Alcohol/week: 0.0 standard drinks  . Drug use: No     Medication list has been reviewed and updated.  Current Meds  Medication Sig  . allopurinol (ZYLOPRIM) 100 MG tablet Take 200 mg by mouth daily. ortho  . aspirin 81 MG tablet Take 1 tablet by mouth daily.  . hydrochlorothiazide (MICROZIDE) 12.5 MG capsule TAKE 1 CAPSULE BY MOUTH EVERY DAY  . ibuprofen (ADVIL,MOTRIN) 200 MG tablet Take 200 mg by mouth as needed.  Marland Kitchen lisinopril (ZESTRIL) 20 MG tablet Take 1 tablet (20 mg total) by mouth daily.  Marland Kitchen loratadine (CLARITIN) 10 MG tablet TAKE 1 TABLET BY MOUTH EVERY DAY  . metoprolol tartrate (LOPRESSOR) 50 MG tablet Take 1 tablet (50 mg total) by mouth 2 (two) times daily.  . Multiple  Vitamins-Minerals (CENTRUM SILVER ADULT 50+ PO) Take 1 tablet by mouth daily.  . pregabalin (LYRICA) 100 MG capsule Take 1 capsule (100 mg total) by mouth 2 (two) times daily.  . sildenafil (REVATIO) 20 MG tablet ONE TABLET AS NEEDED BEFORE SEXUAL ACTIVITY  . valACYclovir (VALTREX) 1000 MG tablet Take 2 tablets (2,000 mg total) by mouth daily. 2 tablets today and 2 tomorrow.    PHQ 2/9 Scores 01/05/2020 07/01/2019 01/12/2019 01/08/2017  PHQ - 2 Score 0 0 0 0  PHQ- 9 Score 0 0 - 1    GAD 7 : Generalized Anxiety Score 01/05/2020 07/01/2019  Nervous, Anxious, on Edge 0 0  Control/stop worrying 0 0  Worry too much - different things 0 0  Trouble relaxing 0 0  Restless 0 0  Easily annoyed or irritable 0 0  Afraid - awful might happen 0 0  Total GAD 7 Score 0 0    BP Readings from Last 3 Encounters:  01/05/20 120/60  07/01/19 120/80  01/12/19 118/78    Physical Exam Vitals and nursing note reviewed.  HENT:     Head: Normocephalic.     Right Ear: Tympanic membrane, ear canal and external ear normal.     Left Ear: Tympanic membrane, ear canal and external ear normal.     Nose: Nose normal. No congestion or rhinorrhea.     Mouth/Throat:  Mouth: Mucous membranes are moist.     Pharynx: No oropharyngeal exudate or posterior oropharyngeal erythema.  Eyes:     General: No scleral icterus.       Right eye: No discharge.        Left eye: No discharge.     Conjunctiva/sclera: Conjunctivae normal.     Pupils: Pupils are equal, round, and reactive to light.  Neck:     Thyroid: No thyromegaly.     Vascular: No JVD.     Trachea: No tracheal deviation.  Cardiovascular:     Rate and Rhythm: Normal rate and regular rhythm.     Heart sounds: Normal heart sounds. No murmur heard.  No friction rub. No gallop.   Pulmonary:     Effort: No respiratory distress.     Breath sounds: Normal breath sounds. No wheezing, rhonchi or rales.  Chest:     Chest wall: No tenderness.  Abdominal:      General: Bowel sounds are normal.     Palpations: Abdomen is soft. There is no mass.     Tenderness: There is no abdominal tenderness. There is no guarding or rebound.  Musculoskeletal:        General: No tenderness. Normal range of motion.     Cervical back: Normal range of motion and neck supple.  Lymphadenopathy:     Cervical: No cervical adenopathy.  Skin:    General: Skin is warm.     Capillary Refill: Capillary refill takes less than 2 seconds.     Findings: No rash.  Neurological:     Mental Status: He is alert and oriented to person, place, and time.     Cranial Nerves: No cranial nerve deficit.     Deep Tendon Reflexes: Reflexes are normal and symmetric.     Wt Readings from Last 3 Encounters:  01/05/20 225 lb (102.1 kg)  07/01/19 224 lb (101.6 kg)  01/12/19 225 lb (102.1 kg)    BP 120/60   Pulse 72   Ht 5\' 9"  (1.753 m)   Wt 225 lb (102.1 kg)   BMI 33.23 kg/m   Assessment and Plan: 1. Essential hypertension Chronic.  Controlled.  Stable.  Continue metoprolol 50 mg 1 twice a day, lisinopril 20 mg once a day and hydrochlorothiazide 12.5 mg once a day.  We will recheck renal function panel. - hydrochlorothiazide (MICROZIDE) 12.5 MG capsule; Take 1 capsule (12.5 mg total) by mouth daily.  Dispense: 90 capsule; Refill: 1 - lisinopril (ZESTRIL) 20 MG tablet; Take 1 tablet (20 mg total) by mouth daily.  Dispense: 90 tablet; Refill: 1 - metoprolol tartrate (LOPRESSOR) 50 MG tablet; Take 1 tablet (50 mg total) by mouth 2 (two) times daily.  Dispense: 180 tablet; Refill: 1 - HgB A1c - Lipid Panel With LDL/HDL Ratio - Renal Function Panel  2. Other male erectile dysfunction Chronic.  Controlled.  Stable.  Continue sildenafil 20 mg 3 tabs as needed. - sildenafil (REVATIO) 20 MG tablet; 3 tabs as needed  Dispense: 50 tablet; Refill: 0  3. Fever blister Chronic.  Episodic.  Currently stable.  Valtrex 1 g 2 tablets daily as needed HSV exacerbation.- valACYclovir (VALTREX)  1000 MG tablet; Take 2 tablets (2,000 mg total) by mouth daily. 2 tablets today and 2 tomorrow.  Dispense: 10 tablet; Refill: 1  4. Neuropathy Chronic.  Controlled.  Stable.  Continue Lyrica 100 mg 1 twice a day as needed. - pregabalin (LYRICA) 100 MG capsule; Take 1 capsule (100 mg total)  by mouth 2 (two) times daily.  Dispense: 60 capsule; Refill: 5  5. Elevated liver enzymes Noted previously and we will recheck given that patient has recently discontinued alcohol intake.  6. Need for immunization against influenza Discussed and administered. - Flu Vaccine QUAD 36+ mos IM  7. Elevated hemoglobin A1c Noted on previous evaluation by another physician that A1c is in the 7.  6 range.  We will recheck A1c and lipid panel renal function panel for glucose measurement in a fasting state tomorrow. - HgB A1c - Lipid Panel With LDL/HDL Ratio - Renal Function Panel

## 2020-01-06 DIAGNOSIS — I10 Essential (primary) hypertension: Secondary | ICD-10-CM | POA: Diagnosis not present

## 2020-01-06 DIAGNOSIS — R7309 Other abnormal glucose: Secondary | ICD-10-CM | POA: Diagnosis not present

## 2020-01-07 LAB — RENAL FUNCTION PANEL
Albumin: 4.4 g/dL (ref 3.8–4.8)
BUN/Creatinine Ratio: 20 (ref 10–24)
BUN: 23 mg/dL (ref 8–27)
CO2: 22 mmol/L (ref 20–29)
Calcium: 9.5 mg/dL (ref 8.6–10.2)
Chloride: 102 mmol/L (ref 96–106)
Creatinine, Ser: 1.15 mg/dL (ref 0.76–1.27)
GFR calc Af Amer: 78 mL/min/{1.73_m2} (ref >59)
GFR calc non Af Amer: 68 mL/min/{1.73_m2} (ref >59)
Glucose: 210 mg/dL — ABNORMAL HIGH (ref 65–99)
Phosphorus: 3 mg/dL (ref 2.8–4.1)
Potassium: 4.4 mmol/L (ref 3.5–5.2)
Sodium: 140 mmol/L (ref 134–144)

## 2020-01-07 LAB — LIPID PANEL WITH LDL/HDL RATIO
Cholesterol, Total: 166 mg/dL (ref 100–199)
HDL: 49 mg/dL (ref >39)
LDL Chol Calc (NIH): 103 mg/dL — ABNORMAL HIGH (ref 0–99)
LDL/HDL Ratio: 2.1 ratio (ref 0.0–3.6)
Triglycerides: 74 mg/dL (ref 0–149)
VLDL Cholesterol Cal: 14 mg/dL (ref 5–40)

## 2020-01-07 LAB — HEMOGLOBIN A1C
Est. average glucose Bld gHb Est-mCnc: 237 mg/dL
Hgb A1c MFr Bld: 9.9 % — ABNORMAL HIGH (ref 4.8–5.6)

## 2020-01-11 ENCOUNTER — Telehealth: Payer: Self-pay

## 2020-01-11 DIAGNOSIS — E119 Type 2 diabetes mellitus without complications: Secondary | ICD-10-CM

## 2020-01-11 MED ORDER — METFORMIN HCL 500 MG PO TABS: 500.0000 mg | ORAL_TABLET | Freq: Two times a day (BID) | ORAL | 1 refills | Status: DC

## 2020-01-11 NOTE — Telephone Encounter (Signed)
One touch ultra mini- needs strips and lancets/ wrote Rx for pt

## 2020-01-17 ENCOUNTER — Other Ambulatory Visit: Payer: Self-pay | Admitting: Family Medicine

## 2020-01-17 DIAGNOSIS — N528 Other male erectile dysfunction: Secondary | ICD-10-CM

## 2020-01-17 NOTE — Telephone Encounter (Signed)
Requested Prescriptions  Pending Prescriptions Disp Refills   sildenafil (REVATIO) 20 MG tablet [Pharmacy Med Name: SILDENAFIL 20 MG TABLET] 50 tablet 0    Sig: ONE TABLET AS NEEDED BEFORE SEXUAL ACTIVITY     Urology: Erectile Dysfunction Agents Passed - 01/17/2020 11:30 AM      Passed - Last BP in normal range    BP Readings from Last 1 Encounters:  01/05/20 120/60         Passed - Valid encounter within last 12 months    Recent Outpatient Visits          1 week ago Essential hypertension   [place], MD   6 months ago Essential hypertension   [place], Deanna C, MD   1 year ago Essential hypertension   [facility] Juline Patch, MD   1 year ago Acute bacterial conjunctivitis of right eye   [facility] Juline Patch, MD   1 year ago Avulsion, finger tip, initial encounter   [place], Deanna C, MD      Future Appointments            In 5 months Juline Patch, MD [facility], [facility]

## 2020-02-02 ENCOUNTER — Other Ambulatory Visit: Payer: Self-pay

## 2020-02-02 ENCOUNTER — Other Ambulatory Visit: Payer: Self-pay | Admitting: Family Medicine

## 2020-02-02 DIAGNOSIS — E119 Type 2 diabetes mellitus without complications: Secondary | ICD-10-CM

## 2020-02-02 MED ORDER — GLIPIZIDE 5 MG PO TABS
5.0000 mg | ORAL_TABLET | Freq: Every day | ORAL | 0 refills | Status: DC
Start: 2020-02-02 — End: 2020-02-24

## 2020-02-02 NOTE — Progress Notes (Unsigned)
Sent in glipizide °

## 2020-02-15 ENCOUNTER — Other Ambulatory Visit: Payer: Self-pay | Admitting: Family Medicine

## 2020-02-15 DIAGNOSIS — N528 Other male erectile dysfunction: Secondary | ICD-10-CM

## 2020-02-15 NOTE — Telephone Encounter (Signed)
Requested Prescriptions  Pending Prescriptions Disp Refills  . sildenafil (REVATIO) 20 MG tablet [Pharmacy Med Name: SILDENAFIL 20 MG TABLET] 50 tablet 0    Sig: ONE TABLET AS NEEDED BEFORE SEXUAL ACTIVITY     Urology: Erectile Dysfunction Agents Passed - 02/15/2020  1:47 AM      Passed - Last BP in normal range    BP Readings from Last 1 Encounters:  01/05/20 120/60         Passed - Valid encounter within last 12 months    Recent Outpatient Visits          1 month ago Essential hypertension   [place], Deanna C, MD   7 months ago Essential hypertension   [place], Deanna C, MD   1 year ago Essential hypertension   [facility] Juline Patch, MD   1 year ago Acute bacterial conjunctivitis of right eye   [facility] Juline Patch, MD   1 year ago Avulsion, finger tip, initial encounter   [place], Deanna C, MD      Future Appointments            In 5 days Juline Patch, MD [facility], [place]   In 4 months Juline Patch, MD [facility], [facility]

## 2020-02-16 ENCOUNTER — Ambulatory Visit: Payer: Self-pay | Admitting: Family Medicine

## 2020-02-20 ENCOUNTER — Ambulatory Visit (INDEPENDENT_AMBULATORY_CARE_PROVIDER_SITE_OTHER): Payer: BC Managed Care – PPO | Admitting: Family Medicine

## 2020-02-20 ENCOUNTER — Encounter: Payer: Self-pay | Admitting: Family Medicine

## 2020-02-20 ENCOUNTER — Other Ambulatory Visit: Payer: Self-pay

## 2020-02-20 VITALS — BP 120/62 | HR 84 | Ht 69.0 in | Wt 223.0 lb

## 2020-02-20 DIAGNOSIS — E119 Type 2 diabetes mellitus without complications: Secondary | ICD-10-CM

## 2020-02-20 DIAGNOSIS — G4733 Obstructive sleep apnea (adult) (pediatric): Secondary | ICD-10-CM | POA: Diagnosis not present

## 2020-02-20 NOTE — Patient Instructions (Signed)
GUIDELINES FOR  LOW-CHOLESTEROL, LOW-TRIGLYCERIDE DIETS    FOODS TO USE   MEATS, FISH Choose lean meats (chicken, turkey, veal, and non-fatty cuts of beef with excess fat trimmed; one serving = 3 oz of cooked meat). Also, fresh or frozen fish, canned fish packed in water, and shellfish (lobster, crabs, shrimp, and oysters). Limit use to no more than one serving of one of these per week. Shellfish are high in cholesterol but low in saturated fat and should be used sparingly. Meats and fish should be broiled (pan or oven) or baked on a rack.  EGGS Egg substitutes and egg whites (use freely). Egg yolks (limit two per week).  FRUITS Eat three servings of fresh fruit per day (1 serving =  cup). Be sure to have at least one citrus fruit daily. Frozen and canned fruit with no sugar or syrup added may be used.  VEGETABLES Most vegetables are not limited (see next page). One dark-green (string beans, escarole) or one deep yellow (squash) vegetable is recommended daily. Cauliflower, broccoli, and celery, as well as potato skins, are recommended for their fiber content. (Fiber is associated with cholesterol reduction) It is preferable to steam vegetables, but they may be boiled, strained, or braised with polyunsaturated vegetable oil (see below).  BEANS Dried peas or beans (1 serving =  cup) may be used as a bread substitute.  NUTS Almonds, walnuts, and peanuts may be used sparingly  (1 serving = 1 Tablespoonful). Use pumpkin, sesame, or sunflower seeds.  BREADS, GRAINS One roll or one slice of whole grain or enriched bread may be used, or three soda crackers or four pieces of melba toast as a substitute. Spaghetti, rice or noodles ( cup) or  large ear of corn may be used as a bread substitute. In preparing these foods do not use butter or shortening, use soft margarine. Also use egg and sugar substitutes.  Choose high fiber grains, such as oats and whole wheat.  CEREALS Use  cup of hot cereal or  cup of  cold cereal per day. Add a sugar substitute if desired, with 99% fat free or skim milk.  MILK PRODUCTS Always use 99% fat free or skim milk, dairy products such as low fat cheeses (farmer's uncreamed diet cottage), low-fat yogurt, and powdered skim milk.  FATS, OILS Use soft (not stick) margarine; vegetable oils that are high in polyunsaturated fats (such as safflower, sunflower, soybean, corn, and cottonseed). Always refrigerate meat drippings to harden the fat and remove it before preparing gravies  DESSERTS, SNACKS Limit to two servings per day; substitute each serving for a bread/cereal serving: ice milk, water sherbet (1/4 cup); unflavored gelatin or gelatin flavored with sugar substitute (1/3 cup); pudding prepared with skim milk (1/2 cup); egg white souffls; unbuttered popcorn (1  cups). Substitute carob for chocolate.  BEVERAGES Fresh fruit juices (limit 4 oz per day); black coffee, plain or herbal teas; soft drinks with sugar substitutes; club soda, preferably salt-free; cocoa made with skim milk or nonfat dried milk and water (sugar substitute added if desired); clear broth. Alcohol: limit two servings per day (see second page).  MISCELLANEOUS  You may use the following freely: vinegar, spices, herbs, nonfat bouillon, mustard, Worcestershire sauce, soy sauce, flavoring essence.                  GUIDELINES FOR  LOW-CHOLESTEROL, LOW TRIGLYCERIDE DIETS    FOODS TO AVOID   MEATS, FISH Marbled beef, pork, bacon, sausage, and other pork products; fatty   fowl (duck, goose); skin and fat of turkey and chicken; processed meats; luncheon meats (salami, bologna); frankfurters and fast-food hamburgers (theyre loaded with fat); organ meats (kidneys, liver); canned fish packed in oil.  EGGS Limit egg yolks to two per week.   FRUITS Coconuts (rich in saturated fats).  VEGETABLES Avoid avocados. Starchy vegetables (potatoes, corn, lima beans, dried peas, beans) may be used only if  substitutes for a serving of bread or cereal. (Baked potato skin, however, is desirable for its fiber content.  BEANS Commercial baked beans with sugar and/or pork added.  NUTS Avoid nuts.  Limit peanuts and walnuts to one tablespoonful per day.  BREADS, GRAINS Any baked goods with shortening and/or sugar. Commercial mixes with dried eggs and whole milk. Avoid sweet rolls, doughnuts, breakfast pastries (Danish), and sweetened packaged cereals (the added sugar converts readily to triglycerides).  MILK PRODUCTS Whole milk and whole-milk packaged goods; cream; ice cream; whole-milk puddings, yogurt, or cheeses; nondairy cream substitutes.  FATS, OILS Butter, lard, animal fats, bacon drippings, gravies, cream sauces as well as palm and coconut oils. All these are high in saturated fats. Examine labels on cholesterol free products for hydrogenated fats. (These are oils that have been hardened into solids and in the process have become saturated.)  DESSERTS, SNACKS Fried snack foods like potato chips; chocolate; candies in general; jams, jellies, syrups; whole- milk puddings; ice cream and milk sherbets; hydrogenated peanut butter.  BEVERAGES Sugared fruit juices and soft drinks; cocoa made with whole milk and/or sugar. When using alcohol (1 oz liquor, 5 oz beer, or 2  oz dry table wine per serving), one serving must be substituted for one bread or cereal serving (limit, two servings of alcohol per day).   SPECIAL NOTES    1. Remember that even non-limited foods should be used in moderation. 2. While on a cholesterol-lowering diet, be sure to avoid animal fats and marbled meats. 3. 3. While on a triglyceride-lowering diet, be sure to avoid sweets and to control the amount of carbohydrates you eat (starchy foods such as flour, bread, potatoes).While on a tri-glyceride-lowering diet, be sure to avoid sweets 4. Buy a good low-fat cookbook, such as the one published by the American Heart Association. 5. Consult  your physician if you have any questions.               [facility] Low Glycemic Diet Plan   Low Glycemic Foods (20-49) Moderate Glycemic Foods (50-69) High Glycemic Foods (70-100)      Breakfast Creals Breakfast Cereals Breakfast Cereals  All Bran All-Bran Fruit'n Oats   Bran Buds Bran Chex   Cheerios Corn chex    Fiber One Oatmeal (not instant)   Just Right Mini-Wheats   Corn Flakes Cream of Wheat    Oat Bran Special K Swiss Muesli   Grape Nuts Grape Nut Flakes      Grits Nutri-Grain    Fruits and fruit juice: Fruits Puffed Rice Puffed Wheat    (Limit to 1-2 Servings per day) Banana (under-ride) Dates   Rice Chex Rice Krispies    Apples Apricots (fresh/dried)   Figs Grapes   Shredded Wheat Team    Blackberries Blueberries   Kiwi Mango   Total     Cherries Cranberries   Oranges Raisins     Peaches Pears    Fruits  Plums Prunes   Fruit Juices Pineapple Watermelon    Grapefruit Raspberries   Cranberry Juice Orange Juice   Banana (over-ripe)       Strawberries Tangerines      Apple Juice Grapefruit Juice   Beans and Legumes Beverages  Tomato Juice    [place]-type baked beans Sodas, sweet tea, pineapple juice   Canned pinto, kidney, or navy beans   Beans and Legumes (fresh-cooked) Green peas Vegetables  Black-eyed peas Butter Beans    Potato, baked, boiled, fried, mashed  Chick peas Lentils   Vegetables [place] fries  Green beans Lima beans   Beets Carrots   Canned or frozen corn  Kidney beans Navy beans   Sweet potato Yam   Parsnips  Pinto beans Snow peas   Corn on the cob Winter squash      Non-starchy vegetables Grains Breads  Asparagus, avocado, broccoli, cabbage Cornmeal Rice, brown   Most breads (white and whole grain)  cauliflower, celery, cucumber, greens Rice, white Couscous   Bagels Bread sticks    lettuce, mushrooms, peppers, tomatoes  Bread stuffing Kaiser roll    okra, onions, spinach, summer squash Pasta Dinner rolls     [employer], cheese     Grains Ravioli, meat filled Spaghetti, white   Grains  Barley Bulgur    Rice, instant Tapioca, with milk    Rye Wild rice   Nuts    Cashews Macadamia   Candy and most cookies  Nuts and oils    Almonds, peanuts, sunflower seeds Snacks Snacks  hazelnuts, pecans, walnuts Chocolate Ice cream, lowfat   Donuts Corn chips    Oils that are liquid at room temperature Muffin Popcorn   Jelly beans Pretzels      Pastries  Dairy, fish, meat, soy, and eggs    Milk, skim Lowfat cheese    Restaurant and ethnic foods  Yogurt, lowfat, fruit sugar sweetened  Most [place] food (sugar in stir fry    or wok sauce)  Lean red meat Fish    Teriyaki-style meats and vegetables  Skinless chicken and [place], shellfish        Egg whites (up to 3 daily), Soy Products    Egg yolks (up to 7 or _____ per week)      Tips for Eating Away From Home If You Have Diabetes Controlling your blood sugar (glucose) levels can be challenging when you do not prepare your own meals. The following tips can help you manage your diabetes when you eat away from home. If you have questions or if you need help, work with your health care provider or diet and nutrition specialist (dietitian). Planning ahead Plan ahead if you know you will be eating away from home:  Try to eat your meals and snacks at about the same time each day. If you know your meal is going to be later than normal, make sure you have a small snack. Being very hungry can cause you to make unhealthy food choices.  Make a list of restaurants near you that offer healthy choices. If a restaurant has a carry-out menu, take the menu home and plan what you will order ahead of time.  Look up the restaurant you want to eat at online. Many chain and fast-food restaurants list nutritional information online. Use this information to choose the healthiest options and to calculate how many carbohydrates will be in your meal.  Use a  carbohydrate-counting book or mobile app to look up the carbohydrate content and serving size of the foods you want to eat. Free foods A "free food" is any food or drink that has less than 5 grams of carbohydrates and  less than 20 calories per serving. These food are high in fiber and nutrients and low in calories, carbohydrates, and fats. Free foods include:  Non-starchy vegetables, such as carrots, broccoli, celery, lettuce, or green beans.  Non-sugar drinks, such as water, unsweetened coffee, or unsweetened tea.  Low-calorie salad dressings.  Sugar-free gelatin. Starting meals with a salad full of vegetables is a healthy choice that includes a lot of free foods. Avoid high-calorie salad toppings like bacon, cheese, and high-fat dressings. Ask for your salad dressing to be served on the side so that you dip your fork in the dressing and then in the salad. This allows you to control how much dressing you eat and still get the flavor with every bite. Choices to control carbohydrates   Ask your server to take away the bread basket or chips from your table.  Choose light yogurt or [place] yogurt instead of non-fat sweetened yogurt.  Order fresh fruit. A salad bar often offers fresh fruit choices. Avoid canned fruit because it is usually packed in sugar or syrup.  Order a salad, and ask for dressing on the side.  Ask for substitutes. For example, if your meal comes with french fries, ask for a side salad or steamed veggies instead. If a meal comes with fried chicken, ask for grilled chicken instead. Beverages  Choose drinks that are low in calories and sugar, such as: ? Water. ? Unsweetened tea or coffee. ? Lowfat milk.  Avoid the following drinks: ? Alcoholic beverages. ? Regular (not diet) sodas. Other tips  If you take insulin, wait to take your insulin once your food arrives to your table. This will ensure that your insulin and your food are timed correctly.  Become familiar with  serving sizes and learn to recognize how many servings are in a portion. Restaurant portions are typically two to three times larger than what you really need.  Ask your server for a to-go box at the beginning of the meal. When your food comes, leave the amount you should have on your plate, and put the rest in the to-go box so that you are not tempted to eat too much.  Consider splitting an entree with someone and ordering a side salad.  Avoid buffets. They are typically too tempting and result in overeating. Where to find more information  American Diabetes Association: www.diabetes.org  American Association of Diabetes Educators: www.diabeteseducator.org Summary  Plan ahead when eating away from home.  Try to eat your meals and snacks at about the same time each day. If you know your meal is going to be later than normal, make sure you have a small snack. Being very hungry can cause you to make unhealthy food choices.  Ask for substitutes. For example, if your meal comes with french fries, ask for a side salad or steamed veggies instead. If a meal comes with fried chicken, ask for grilled chicken instead.  Ask for a to-go box when you order your meal. Divide your meal before you start eating. This information is not intended to replace advice given to you by your health care provider. Make sure you discuss any questions you have with your health care provider. Document Revised: 07/02/2016 Document Reviewed: 07/02/2016 Elsevier Patient Education  2020 [place] With Diabetes Diabetes (type 1 diabetes mellitus or type 2 diabetes mellitus) is a condition in which the body does not have enough of a hormone called insulin, or the body does not respond properly to insulin. Normally,  insulin allows sugars (glucose) to enter cells in the body. The cells use glucose for energy. With diabetes, extra glucose builds up in the blood instead of going into cells, which results in high  blood glucose (hyperglycemia). How to manage lifestyle changes Managing diabetes includes medical treatments as well as lifestyle changes. If diabetes is not managed well, serious physical and emotional complications can occur. Taking good care of yourself means that you are responsible for:  Monitoring glucose regularly.  Eating a healthy diet.  Exercising regularly.  Meeting with health care providers.  Taking medicines as directed. Some people may feel a lot of stress about managing their diabetes. This is known as emotional distress, and it is very common. Living with diabetes can place you at risk for emotional distress, depression, or anxiety. These disorders can be confusing and can make diabetes management more difficult. How to recognize stress Emotional distress Symptoms of emotional distress include:  Anger about having a diagnosis of diabetes.  Fear or frustration about your diagnosis and the changes you need to make to manage the condition.  Being overly worried about the care that you need or the cost of the care that you need.  Feeling like you caused your condition by doing something wrong.  Fear of unpredictable situations, like low or high blood glucose.  Feeling judged by your health care providers.  Feeling very alone with the disease.  Getting too tired or worn out with the demands of daily care. Depression Having diabetes means that you are at a higher risk for depression. Having depression also means that you are at a higher risk for diabetes. Your health care provider may test (screen) you for symptoms of depression. It is important to recognize depression symptoms and to start treatment for depression soon after it is diagnosed. The following are some symptoms of depression:  Loss of interest in things that you used to enjoy.  Trouble sleeping, or often waking up early and not being able to get back to sleep.  A change in appetite.  Feeling tired  most of the day.  Feeling nervous and anxious.  Feeling guilty and worrying that you are a burden to others.  Feeling depressed more often than you do not feel that way.  Thoughts of hurting yourself or feeling that you want to die. If you have any of these symptoms for 2 weeks or longer, reach out to a health care provider. Follow these instructions at home: Managing emotional distress The following are some ways to manage emotional distress:  Talk with your health care provider or certified diabetes educator. Consider working with a counselor or therapist.  Learn as much as you can about diabetes and its treatment. Meet with a certified diabetes educator or take a class to learn how to manage your condition.  Keep a journal of your thoughts and concerns.  Accept that some things are out of your control.  Talk with other people who have diabetes. It can help to talk with others about the emotional distress that you feel.  Find ways to manage stress that work for you. These may include art or music therapy, exercise, meditation, and hobbies.  Seek support from spiritual leaders, family, and friends. General instructions  Follow your diabetes management plan.  Keep all follow-up visits as told by your health care provider. This is important. Where to find support   Ask your health care provider to recommend a therapist who understands both depression and diabetes.  Search  for information and support from the American Diabetes Association: www.diabetes.org  Find a certified diabetes educator and make an appointment through [place] of Diabetes Educators: www.diabeteseducator.org Get help right away if:  You have thoughts about hurting yourself or others. If you ever feel like you may hurt yourself or others, or have thoughts about taking your own life, get help right away. You can go to your nearest emergency department or call:  Your local emergency services  (911 in the [place]  A suicide crisis helpline, such as the [place] at 667-853-0675. This is open 24 hours a day. Summary  Diabetes (type 1 diabetes mellitus or type 2 diabetes mellitus) is a condition in which the body does not have enough of a hormone called insulin, or the body does not respond properly to insulin.  Living with diabetes puts you at risk for medical issues, and it also puts you at risk for emotional issues such as emotional distress, depression, and anxiety.  Recognizing the symptoms of emotional distress and depression may help you avoid problems with your diabetes control. It is important to start treatment for emotional distress and depression soon after they are diagnosed.  Having diabetes means that you are at a higher risk for depression. Ask your health care provider to recommend a therapist who understands both depression and diabetes.  If you experience symptoms of emotional distress or depression, it is important to discuss this with your health care provider, certified diabetes educator, or therapist. This information is not intended to replace advice given to you by your health care provider. Make sure you discuss any questions you have with your health care provider. Document Revised: 04/05/2018 Document Reviewed: 08/07/2016 Elsevier Patient Education  [place].  Diabetes Mellitus and Nutrition, Adult When you have diabetes (diabetes mellitus), it is very important to have healthy eating habits because your blood sugar (glucose) levels are greatly affected by what you eat and drink. Eating healthy foods in the appropriate amounts, at about the same times every day, can help you:  Control your blood glucose.  Lower your risk of heart disease.  Improve your blood pressure.  Reach or maintain a healthy weight. Every person with diabetes is different, and each person has different needs for a meal plan. Your health care  provider may recommend that you work with a diet and nutrition specialist (dietitian) to make a meal plan that is best for you. Your meal plan may vary depending on factors such as:  The calories you need.  The medicines you take.  Your weight.  Your blood glucose, blood pressure, and cholesterol levels.  Your activity level.  Other health conditions you have, such as heart or kidney disease. How do carbohydrates affect me? Carbohydrates, also called carbs, affect your blood glucose level more than any other type of food. Eating carbs naturally raises the amount of glucose in your blood. Carb counting is a method for keeping track of how many carbs you eat. Counting carbs is important to keep your blood glucose at a healthy level, especially if you use insulin or take certain oral diabetes medicines. It is important to know how many carbs you can safely have in each meal. This is different for every person. Your dietitian can help you calculate how many carbs you should have at each meal and for each snack. Foods that contain carbs include:  Bread, cereal, rice, pasta, and crackers.  Potatoes and corn.  Peas, beans, and lentils.  Milk and yogurt.  Fruit and juice.  Desserts, such as cakes, cookies, ice cream, and candy. How does alcohol affect me? Alcohol can cause a sudden decrease in blood glucose (hypoglycemia), especially if you use insulin or take certain oral diabetes medicines. Hypoglycemia can be a life-threatening condition. Symptoms of hypoglycemia (sleepiness, dizziness, and confusion) are similar to symptoms of having too much alcohol. If your health care provider says that alcohol is safe for you, follow these guidelines:  Limit alcohol intake to no more than 1 drink per day for nonpregnant women and 2 drinks per day for men. One drink equals 12 oz of beer, 5 oz of wine, or 1 oz of hard liquor.  Do not drink on an empty stomach.  Keep yourself hydrated with water,  diet soda, or unsweetened iced tea.  Keep in mind that regular soda, juice, and other mixers may contain a lot of sugar and must be counted as carbs. What are tips for following this plan?  Reading food labels  Start by checking the serving size on the "Nutrition Facts" label of packaged foods and drinks. The amount of calories, carbs, fats, and other nutrients listed on the label is based on one serving of the item. Many items contain more than one serving per package.  Check the total grams (g) of carbs in one serving. You can calculate the number of servings of carbs in one serving by dividing the total carbs by 15. For example, if a food has 30 g of total carbs, it would be equal to 2 servings of carbs.  Check the number of grams (g) of saturated and trans fats in one serving. Choose foods that have low or no amount of these fats.  Check the number of milligrams (mg) of salt (sodium) in one serving. Most people should limit total sodium intake to less than 2,300 mg per day.  Always check the nutrition information of foods labeled as "low-fat" or "nonfat". These foods may be higher in added sugar or refined carbs and should be avoided.  Talk to your dietitian to identify your daily goals for nutrients listed on the label. Shopping  Avoid buying canned, premade, or processed foods. These foods tend to be high in fat, sodium, and added sugar.  Shop around the outside edge of the grocery store. This includes fresh fruits and vegetables, bulk grains, fresh meats, and fresh dairy. Cooking  Use low-heat cooking methods, such as baking, instead of high-heat cooking methods like deep frying.  Cook using healthy oils, such as olive, canola, or sunflower oil.  Avoid cooking with butter, cream, or high-fat meats. Meal planning  Eat meals and snacks regularly, preferably at the same times every day. Avoid going long periods of time without eating.  Eat foods high in fiber, such as fresh  fruits, vegetables, beans, and whole grains. Talk to your dietitian about how many servings of carbs you can eat at each meal.  Eat 4-6 ounces (oz) of lean protein each day, such as lean meat, chicken, fish, eggs, or tofu. One oz of lean protein is equal to: ? 1 oz of meat, chicken, or fish. ? 1 egg. ?  cup of tofu.  Eat some foods each day that contain healthy fats, such as avocado, nuts, seeds, and fish. Lifestyle  Check your blood glucose regularly.  Exercise regularly as told by your health care provider. This may include: ? 150 minutes of moderate-intensity or vigorous-intensity exercise each week. This could be brisk walking,  biking, or water aerobics. ? Stretching and doing strength exercises, such as yoga or weightlifting, at least 2 times a week.  Take medicines as told by your health care provider.  Do not use any products that contain nicotine or tobacco, such as cigarettes and e-cigarettes. If you need help quitting, ask your health care provider.  Work with a Social worker or diabetes educator to identify strategies to manage stress and any emotional and social challenges. Questions to ask a health care provider  Do I need to meet with a diabetes educator?  Do I need to meet with a dietitian?  What number can I call if I have questions?  When are the best times to check my blood glucose? Where to find more information:  American Diabetes Association: diabetes.org  Academy of Nutrition and Dietetics: www.eatright.[employer] of Diabetes and Digestive and Kidney Diseases (NIH): [employer] Summary  A healthy meal plan will help you control your blood glucose and maintain a healthy lifestyle.  Working with a diet and nutrition specialist (dietitian) can help you make a meal plan that is best for you.  Keep in mind that carbohydrates (carbs) and alcohol have immediate effects on your blood glucose levels. It is important to count carbs and to use  alcohol carefully. This information is not intended to replace advice given to you by your health care provider. Make sure you discuss any questions you have with your health care provider. Document Revised: 03/06/2017 Document Reviewed: 04/28/2016 Elsevier Patient Education  2020 [place] for Diabetes Mellitus, Adult  Carbohydrate counting is a method of keeping track of how many carbohydrates you eat. Eating carbohydrates naturally increases the amount of sugar (glucose) in the blood. Counting how many carbohydrates you eat helps keep your blood glucose within normal limits, which helps you manage your diabetes (diabetes mellitus). It is important to know how many carbohydrates you can safely have in each meal. This is different for every person. A diet and nutrition specialist (registered dietitian) can help you make a meal plan and calculate how many carbohydrates you should have at each meal and snack. Carbohydrates are found in the following foods:  Grains, such as breads and cereals.  Dried beans and soy products.  Starchy vegetables, such as potatoes, peas, and corn.  Fruit and fruit juices.  Milk and yogurt.  Sweets and snack foods, such as cake, cookies, candy, chips, and soft drinks. How do I count carbohydrates? There are two ways to count carbohydrates in food. You can use either of the methods or a combination of both. Reading "Nutrition Facts" on packaged food The "Nutrition Facts" list is included on the labels of almost all packaged foods and beverages in the [place] It includes:  The serving size.  Information about nutrients in each serving, including the grams (g) of carbohydrate per serving. To use the "Nutrition Facts":  Decide how many servings you will have.  Multiply the number of servings by the number of carbohydrates per serving.  The resulting number is the total amount of carbohydrates that you will be having. Learning  standard serving sizes of other foods When you eat carbohydrate foods that are not packaged or do not include "Nutrition Facts" on the label, you need to measure the servings in order to count the amount of carbohydrates:  Measure the foods that you will eat with a food scale or measuring cup, if needed.  Decide how many standard-size servings you will eat.  Multiply  the number of servings by 15. Most carbohydrate-rich foods have about 15 g of carbohydrates per serving. ? For example, if you eat 8 oz (170 g) of strawberries, you will have eaten 2 servings and 30 g of carbohydrates (2 servings x 15 g = 30 g).  For foods that have more than one food mixed, such as soups and casseroles, you must count the carbohydrates in each food that is included. The following list contains standard serving sizes of common carbohydrate-rich foods. Each of these servings has about 15 g of carbohydrates:   hamburger bun or  English muffin.   oz (15 mL) syrup.   oz (14 g) jelly.  1 slice of bread.  1 six-inch tortilla.  3 oz (85 g) cooked rice or pasta.  4 oz (113 g) cooked dried beans.  4 oz (113 g) starchy vegetable, such as peas, corn, or potatoes.  4 oz (113 g) hot cereal.  4 oz (113 g) mashed potatoes or  of a large baked potato.  4 oz (113 g) canned or frozen fruit.  4 oz (120 mL) fruit juice.  4-6 crackers.  6 chicken nuggets.  6 oz (170 g) unsweetened dry cereal.  6 oz (170 g) plain fat-free yogurt or yogurt sweetened with artificial sweeteners.  8 oz (240 mL) milk.  8 oz (170 g) fresh fruit or one small piece of fruit.  24 oz (680 g) popped popcorn. Example of carbohydrate counting Sample meal  3 oz (85 g) chicken breast.  6 oz (170 g) brown rice.  4 oz (113 g) corn.  8 oz (240 mL) milk.  8 oz (170 g) strawberries with sugar-free whipped topping. Carbohydrate calculation 1. Identify the foods that contain  carbohydrates: ? Rice. ? Corn. ? Milk. ? Strawberries. 2. Calculate how many servings you have of each food: ? 2 servings rice. ? 1 serving corn. ? 1 serving milk. ? 1 serving strawberries. 3. Multiply each number of servings by 15 g: ? 2 servings rice x 15 g = 30 g. ? 1 serving corn x 15 g = 15 g. ? 1 serving milk x 15 g = 15 g. ? 1 serving strawberries x 15 g = 15 g. 4. Add together all of the amounts to find the total grams of carbohydrates eaten: ? 30 g + 15 g + 15 g + 15 g = 75 g of carbohydrates total. Summary  Carbohydrate counting is a method of keeping track of how many carbohydrates you eat.  Eating carbohydrates naturally increases the amount of sugar (glucose) in the blood.  Counting how many carbohydrates you eat helps keep your blood glucose within normal limits, which helps you manage your diabetes.  A diet and nutrition specialist (registered dietitian) can help you make a meal plan and calculate how many carbohydrates you should have at each meal and snack. This information is not intended to replace advice given to you by your health care provider. Make sure you discuss any questions you have with your health care provider. Document Revised: 10/16/2016 Document Reviewed: 09/05/2015 Elsevier Patient Education  [place].

## 2020-02-20 NOTE — Progress Notes (Signed)
Date:  02/20/2020   Name:  Jerry Harmon   DOB:  September 08, 1955   MRN:  [id number]   Chief Complaint: Diabetes (added glipizide)  Diabetes He presents for his follow-up diabetic visit. He has type 2 diabetes mellitus. His disease course has been fluctuating. There are no hypoglycemic associated symptoms. Pertinent negatives for hypoglycemia include no dizziness, headaches or nervousness/anxiousness. Associated symptoms include blurred vision, fatigue, foot paresthesias, polydipsia, polyphagia, visual change and weight loss. Pertinent negatives for diabetes include no chest pain, no foot ulcerations, no polyuria and no weakness. There are no hypoglycemic complications. Symptoms are stable. There are no diabetic complications. Current diabetic treatment includes diet and oral agent (dual therapy). He is compliant with treatment all of the time. His weight is decreasing steadily. He is following a generally unhealthy diet. Meal planning includes avoidance of concentrated sweets and carbohydrate counting. His home blood glucose trend is increasing steadily. His breakfast blood glucose is taken between 8-9 am. His breakfast blood glucose range is generally 180-200 mg/dl. An ACE inhibitor/angiotensin II receptor blocker is being taken.    Lab Results  Component Value Date   CREATININE 1.15 01/06/2020   BUN 23 01/06/2020   NA 140 01/06/2020   K 4.4 01/06/2020   CL 102 01/06/2020   CO2 22 01/06/2020   Lab Results  Component Value Date   CHOL 166 01/06/2020   HDL 49 01/06/2020   LDLCALC 103 (H) 01/06/2020   TRIG 74 01/06/2020   CHOLHDL 3.8 01/12/2019   Lab Results  Component Value Date   TSH 2.600 03/12/2015   Lab Results  Component Value Date   HGBA1C 9.9 (H) 01/06/2020   Lab Results  Component Value Date   WBC 5.8 05/05/2019   HGB 13.3 (A) 05/05/2019   HCT 39 (A) 05/05/2019   PLT 191 05/05/2019   Lab Results  Component Value Date   ALT 63 (H) 07/01/2019   AST 45 (H) 07/01/2019     ALKPHOS 63 07/01/2019   BILITOT 0.3 07/01/2019     Review of Systems  Constitutional: Positive for fatigue and weight loss. Negative for chills and fever.  HENT: Negative for drooling, ear discharge, ear pain and sore throat.   Eyes: Positive for blurred vision.  Respiratory: Negative for cough, shortness of breath and wheezing.   Cardiovascular: Negative for chest pain, palpitations and leg swelling.  Gastrointestinal: Negative for abdominal pain, blood in stool, constipation, diarrhea and nausea.  Endocrine: Positive for polydipsia and polyphagia. Negative for polyuria.  Genitourinary: Negative for dysuria, frequency, hematuria and urgency.  Musculoskeletal: Negative for back pain, myalgias and neck pain.  Skin: Negative for rash.  Allergic/Immunologic: Negative for environmental allergies.  Neurological: Negative for dizziness, weakness and headaches.  Hematological: Does not bruise/bleed easily.  Psychiatric/Behavioral: Negative for suicidal ideas. The patient is not nervous/anxious.     Patient Active Problem List   Diagnosis Date Noted  . Fever blister 07/01/2019  . Adjustment disorder 03/27/2016  . Neuropathy 11/13/2015  . Allergic rhinitis due to pollen 11/13/2015  . BP (high blood pressure) 09/11/2014  . Cardiomyopathy ([place]) 09/11/2014  . Sleep apnea 09/11/2014  . Blood pressure elevated without history of HTN 09/11/2014  . Erectile dysfunction 09/11/2014  . Lump in testis 09/11/2014  . 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 Cancer of testis, seminoma ([place]) 12/01/2013  . Benign prostatic hyperplasia with urinary obstruction 11/19/2013    No Known Allergies  Past Surgical History:  Procedure Laterality Date  .  HERNIA REPAIR    . RADICAL ORCHIECTOMY      Social History   Tobacco Use  . Smoking status: Former Research scientist (life sciences)  . Smokeless tobacco: Never Used  Substance Use Topics  . Alcohol use: No    Alcohol/week: 0.0 standard drinks  .  Drug use: No     Medication list has been reviewed and updated.  Current Meds  Medication Sig  . allopurinol (ZYLOPRIM) 100 MG tablet Take 200 mg by mouth daily. ortho  . aspirin 81 MG tablet Take 1 tablet by mouth daily.  Marland Kitchen glipiZIDE (GLUCOTROL) 5 MG tablet Take 1 tablet (5 mg total) by mouth daily before breakfast. Take 1 tablet in am  . hydrochlorothiazide (MICROZIDE) 12.5 MG capsule Take 1 capsule (12.5 mg total) by mouth daily.  Marland Kitchen ibuprofen (ADVIL,MOTRIN) 200 MG tablet Take 200 mg by mouth as needed.  Marland Kitchen lisinopril (ZESTRIL) 20 MG tablet Take 1 tablet (20 mg total) by mouth daily.  Marland Kitchen loratadine (CLARITIN) 10 MG tablet TAKE 1 TABLET BY MOUTH EVERY DAY  . metFORMIN (GLUCOPHAGE) 500 MG tablet Take 1 tablet (500 mg total) by mouth 2 (two) times daily with a meal.  . metoprolol tartrate (LOPRESSOR) 50 MG tablet Take 1 tablet (50 mg total) by mouth 2 (two) times daily.  . Multiple Vitamins-Minerals (CENTRUM SILVER ADULT 50+ PO) Take 1 tablet by mouth daily.  . pregabalin (LYRICA) 100 MG capsule Take 1 capsule (100 mg total) by mouth 2 (two) times daily.  . sildenafil (REVATIO) 20 MG tablet ONE TABLET AS NEEDED BEFORE SEXUAL ACTIVITY  . traMADol (ULTRAM) 50 MG tablet Take 1 tablet (50 mg total) by mouth every 12 (twelve) hours as needed for moderate pain.  . valACYclovir (VALTREX) 1000 MG tablet Take 2 tablets (2,000 mg total) by mouth daily. 2 tablets today and 2 tomorrow.    PHQ 2/9 Scores 02/20/2020 01/05/2020 07/01/2019 01/12/2019  PHQ - 2 Score 0 0 0 0  PHQ- 9 Score 0 0 0 -    GAD 7 : Generalized Anxiety Score 02/20/2020 01/05/2020 07/01/2019  Nervous, Anxious, on Edge 0 0 0  Control/stop worrying 0 0 0  Worry too much - different things 0 0 0  Trouble relaxing 0 0 0  Restless 0 0 0  Easily annoyed or irritable 0 0 0  Afraid - awful might happen 0 0 0  Total GAD 7 Score 0 0 0    BP Readings from Last 3 Encounters:  02/20/20 120/62  01/05/20 120/60  07/01/19 120/80     Physical Exam Vitals and nursing note reviewed.  HENT:     Head: Normocephalic.     Right Ear: Tympanic membrane, ear canal and external ear normal.     Left Ear: Tympanic membrane, ear canal and external ear normal.     Nose: Nose normal. No congestion or rhinorrhea.  Eyes:     General: No scleral icterus.       Right eye: No discharge.        Left eye: No discharge.     Conjunctiva/sclera: Conjunctivae normal.     Pupils: Pupils are equal, round, and reactive to light.  Neck:     Thyroid: No thyromegaly.     Vascular: No JVD.     Trachea: No tracheal deviation.  Cardiovascular:     Rate and Rhythm: Normal rate and regular rhythm.     Heart sounds: Normal heart sounds. No murmur heard.  No friction rub. No gallop.   Pulmonary:  Effort: No respiratory distress.     Breath sounds: Normal breath sounds. No wheezing, rhonchi or rales.  Abdominal:     General: Bowel sounds are normal.     Palpations: Abdomen is soft. There is no mass.     Tenderness: There is no abdominal tenderness. There is no guarding or rebound.  Musculoskeletal:        General: No tenderness. Normal range of motion.     Cervical back: Normal range of motion and neck supple.  Lymphadenopathy:     Cervical: No cervical adenopathy.  Skin:    General: Skin is warm.     Findings: No rash.  Neurological:     Mental Status: He is alert and oriented to person, place, and time.     Cranial Nerves: No cranial nerve deficit.     Deep Tendon Reflexes: Reflexes are normal and symmetric.     Wt Readings from Last 3 Encounters:  02/20/20 223 lb (101.2 kg)  01/05/20 225 lb (102.1 kg)  07/01/19 224 lb (101.6 kg)    BP 120/62   Pulse 84   Ht 5\' 9"  (1.753 m)   Wt 223 lb (101.2 kg)   BMI 32.93 kg/m   Assessment and Plan: Patient's chart was reviewed from most recent encounter.  Review of the previous discussion we had concerning diabetes and patient has a better understanding about the importance of  dietary involvement along with the taking of medications.  There is no concern about medication compliance but dietary has been not foremost in his concern.  1. New onset type 2 diabetes mellitus ([place]) New onset.  Persistent.  Uncontrolled.  Uncomplicated.  Patient just has not had a full grasp of the concern we have about this diabetes.  Patient had a 7.8 earlier in the year with a 9.9 A1c last visit.  At this time we started him on glipizide 5 mg once a day and Metformin 500 mg twice a day.  Patient says that he has taken medications but has not been compliant concerning diet.  We had a long discussion about the importance of the dietary approach and patient's been given nutritional information on glycemic content of foods and adjustment thereof.  We will check an A1c if elevated we may be increasing either the Metformin or glipizide or both.  We will recheck the patient in 6 to 8 weeks. - Hemoglobin A1c

## 2020-02-21 ENCOUNTER — Other Ambulatory Visit: Payer: Self-pay

## 2020-02-21 DIAGNOSIS — E119 Type 2 diabetes mellitus without complications: Secondary | ICD-10-CM

## 2020-02-21 LAB — HEMOGLOBIN A1C
Est. average glucose Bld gHb Est-mCnc: 206 mg/dL
Hgb A1c MFr Bld: 8.8 % — ABNORMAL HIGH (ref 4.8–5.6)

## 2020-02-21 NOTE — Progress Notes (Unsigned)
Referral placed.

## 2020-02-22 ENCOUNTER — Other Ambulatory Visit: Payer: Self-pay | Admitting: Family Medicine

## 2020-02-22 DIAGNOSIS — J301 Allergic rhinitis due to pollen: Secondary | ICD-10-CM

## 2020-02-24 ENCOUNTER — Other Ambulatory Visit: Payer: Self-pay | Admitting: Family Medicine

## 2020-02-24 DIAGNOSIS — E119 Type 2 diabetes mellitus without complications: Secondary | ICD-10-CM

## 2020-02-24 NOTE — Telephone Encounter (Signed)
Requested Prescriptions  Pending Prescriptions Disp Refills   glipiZIDE (GLUCOTROL) 5 MG tablet [Pharmacy Med Name: GLIPIZIDE 5 MG TABLET] 30 tablet 2    Sig: TAKE 1 TABLET (5 MG TOTAL) BY MOUTH DAILY BEFORE BREAKFAST.     Endocrinology:  Diabetes - Sulfonylureas Failed - 02/24/2020  2:31 PM      Failed - HBA1C is between 0 and 7.9 and within 180 days    Hemoglobin A1C  Date Value Ref Range Status  05/05/2019 7.6  Final   Hgb A1c MFr Bld  Date Value Ref Range Status  02/20/2020 8.8 (H) 4.8 - 5.6 % Final    Comment:             Prediabetes: 5.7 - 6.4          Diabetes: >6.4          Glycemic control for adults with diabetes: <7.0          Passed - Valid encounter within last 6 months    Recent Outpatient Visits          4 days ago New onset type 2 diabetes mellitus ([place])   [facility] Juline Patch, MD   1 month ago Essential hypertension   [place], Deanna C, MD   7 months ago Essential hypertension   [place], Deanna C, MD   1 year ago Essential hypertension   [facility] Juline Patch, MD   1 year ago Acute bacterial conjunctivitis of right eye   [place], MD      Future Appointments            In 3 months Juline Patch, MD [facility], [facility]

## 2020-02-28 ENCOUNTER — Other Ambulatory Visit: Payer: Self-pay | Admitting: Family Medicine

## 2020-02-28 DIAGNOSIS — E119 Type 2 diabetes mellitus without complications: Secondary | ICD-10-CM

## 2020-03-06 DIAGNOSIS — E1140 Type 2 diabetes mellitus with diabetic neuropathy, unspecified: Secondary | ICD-10-CM | POA: Diagnosis not present

## 2020-03-06 DIAGNOSIS — I10 Essential (primary) hypertension: Secondary | ICD-10-CM | POA: Diagnosis not present

## 2020-03-06 DIAGNOSIS — E119 Type 2 diabetes mellitus without complications: Secondary | ICD-10-CM | POA: Diagnosis not present

## 2020-03-06 DIAGNOSIS — E1165 Type 2 diabetes mellitus with hyperglycemia: Secondary | ICD-10-CM | POA: Diagnosis not present

## 2020-03-07 ENCOUNTER — Telehealth: Payer: Self-pay

## 2020-03-07 NOTE — Telephone Encounter (Signed)
Spoke to pt and told himTara is out this week, will route message so she can call him some time next week.  Copied from [place] [phone]. Topic: General - Other >> Mar 07, 2020 10:34 AM Oneta Rack wrote: Reason for CRM: patient requesting a return call from Baxter Flattery regarding her and Dr. Ronnald Ramp opinion regarding a specialist. Patient declined to elaborate.

## 2020-03-11 ENCOUNTER — Other Ambulatory Visit: Payer: Self-pay | Admitting: Family Medicine

## 2020-03-11 DIAGNOSIS — N528 Other male erectile dysfunction: Secondary | ICD-10-CM

## 2020-03-11 DIAGNOSIS — J301 Allergic rhinitis due to pollen: Secondary | ICD-10-CM

## 2020-03-11 NOTE — Telephone Encounter (Signed)
Requested Prescriptions  Pending Prescriptions Disp Refills  . sildenafil (REVATIO) 20 MG tablet [Pharmacy Med Name: SILDENAFIL 20 MG TABLET] 50 tablet 0    Sig: ONE TABLET AS NEEDED BEFORE SEXUAL ACTIVITY     Urology: Erectile Dysfunction Agents Passed - 03/11/2020  8:19 AM      Passed - Last BP in normal range    BP Readings from Last 1 Encounters:  02/20/20 120/62         Passed - Valid encounter within last 12 months    Recent Outpatient Visits          2 weeks ago New onset type 2 diabetes mellitus ([place])   [facility] Juline Patch, MD   2 months ago Essential hypertension   [facility] Juline Patch, MD   8 months ago Essential hypertension   [place], Deanna C, MD   1 year ago Essential hypertension   [facility] Juline Patch, MD   1 year ago Acute bacterial conjunctivitis of right eye   [facility] Juline Patch, MD      Future Appointments            In 3 months Juline Patch, MD [facility], [facility]           . loratadine (CLARITIN) 10 MG tablet [Pharmacy Med Name: LORATADINE 10 MG TABLET] 90 tablet     Sig: TAKE 1 TABLET BY MOUTH EVERY DAY     Ear, Nose, and Throat:  Antihistamines Passed - 03/11/2020  8:19 AM      Passed - Valid encounter within last 12 months    Recent Outpatient Visits          2 weeks ago New onset type 2 diabetes mellitus ([place])   [facility] Juline Patch, MD   2 months ago Essential hypertension   [place], Deanna C, MD   8 months ago Essential hypertension   [place], Deanna C, MD   1 year ago Essential hypertension   [facility] Juline Patch, MD   1 year ago Acute bacterial conjunctivitis of right eye   [facility] Juline Patch, MD      Future Appointments            In 3 months Juline Patch, MD [facility], [facility]

## 2020-03-12 NOTE — Telephone Encounter (Signed)
Gave info on walk in and other psychiatrists

## 2020-04-04 ENCOUNTER — Other Ambulatory Visit: Payer: Self-pay | Admitting: Family Medicine

## 2020-04-04 DIAGNOSIS — E119 Type 2 diabetes mellitus without complications: Secondary | ICD-10-CM

## 2020-04-07 ENCOUNTER — Other Ambulatory Visit: Payer: Self-pay | Admitting: Family Medicine

## 2020-04-28 ENCOUNTER — Other Ambulatory Visit: Payer: Self-pay | Admitting: Family Medicine

## 2020-04-28 DIAGNOSIS — J301 Allergic rhinitis due to pollen: Secondary | ICD-10-CM

## 2020-04-28 NOTE — Telephone Encounter (Signed)
Requested Prescriptions  Pending Prescriptions Disp Refills  . loratadine (CLARITIN) 10 MG tablet [Pharmacy Med Name: LORATADINE 10 MG TABLET] 90 tablet 0    Sig: TAKE 1 TABLET BY MOUTH EVERY DAY     Ear, Nose, and Throat:  Antihistamines Passed - 04/28/2020 12:37 PM      Passed - Valid encounter within last 12 months    Recent Outpatient Visits          2 months ago New onset type 2 diabetes mellitus ([place])   [facility] Juline Patch, MD   3 months ago Essential hypertension   [place], MD   10 months ago Essential hypertension   [place], Deanna C, MD   1 year ago Essential hypertension   [facility] Juline Patch, MD   1 year ago Acute bacterial conjunctivitis of right eye   [facility] Juline Patch, MD      Future Appointments            In 1 month Juline Patch, MD [facility], [facility]

## 2020-05-03 DIAGNOSIS — T451X5A Adverse effect of antineoplastic and immunosuppressive drugs, initial encounter: Secondary | ICD-10-CM | POA: Diagnosis not present

## 2020-05-03 DIAGNOSIS — I10 Essential (primary) hypertension: Secondary | ICD-10-CM | POA: Diagnosis not present

## 2020-05-03 DIAGNOSIS — Z7982 Long term (current) use of aspirin: Secondary | ICD-10-CM | POA: Diagnosis not present

## 2020-05-03 DIAGNOSIS — G629 Polyneuropathy, unspecified: Secondary | ICD-10-CM | POA: Diagnosis not present

## 2020-05-03 DIAGNOSIS — H9319 Tinnitus, unspecified ear: Secondary | ICD-10-CM | POA: Diagnosis not present

## 2020-05-03 DIAGNOSIS — G620 Drug-induced polyneuropathy: Secondary | ICD-10-CM | POA: Diagnosis not present

## 2020-05-03 DIAGNOSIS — R5383 Other fatigue: Secondary | ICD-10-CM | POA: Diagnosis not present

## 2020-05-03 DIAGNOSIS — C6291 Malignant neoplasm of right testis, unspecified whether descended or undescended: Secondary | ICD-10-CM | POA: Diagnosis not present

## 2020-05-03 DIAGNOSIS — Z8547 Personal history of malignant neoplasm of testis: Secondary | ICD-10-CM | POA: Diagnosis not present

## 2020-05-03 DIAGNOSIS — C6290 Malignant neoplasm of unspecified testis, unspecified whether descended or undescended: Secondary | ICD-10-CM | POA: Diagnosis not present

## 2020-05-03 DIAGNOSIS — H9313 Tinnitus, bilateral: Secondary | ICD-10-CM | POA: Diagnosis not present

## 2020-05-08 DIAGNOSIS — H903 Sensorineural hearing loss, bilateral: Secondary | ICD-10-CM | POA: Diagnosis not present

## 2020-05-08 DIAGNOSIS — H9313 Tinnitus, bilateral: Secondary | ICD-10-CM | POA: Diagnosis not present

## 2020-05-18 DIAGNOSIS — Z452 Encounter for adjustment and management of vascular access device: Secondary | ICD-10-CM | POA: Diagnosis not present

## 2020-05-18 DIAGNOSIS — C6291 Malignant neoplasm of right testis, unspecified whether descended or undescended: Secondary | ICD-10-CM | POA: Diagnosis not present

## 2020-05-18 DIAGNOSIS — I10 Essential (primary) hypertension: Secondary | ICD-10-CM | POA: Diagnosis not present

## 2020-05-21 DIAGNOSIS — G4733 Obstructive sleep apnea (adult) (pediatric): Secondary | ICD-10-CM | POA: Diagnosis not present

## 2020-05-22 ENCOUNTER — Other Ambulatory Visit: Payer: Self-pay | Admitting: Family Medicine

## 2020-05-22 DIAGNOSIS — E119 Type 2 diabetes mellitus without complications: Secondary | ICD-10-CM

## 2020-06-21 ENCOUNTER — Ambulatory Visit (INDEPENDENT_AMBULATORY_CARE_PROVIDER_SITE_OTHER): Payer: BC Managed Care – PPO | Admitting: Family Medicine

## 2020-06-21 ENCOUNTER — Encounter: Payer: Self-pay | Admitting: Family Medicine

## 2020-06-21 ENCOUNTER — Other Ambulatory Visit: Payer: Self-pay

## 2020-06-21 VITALS — BP 124/68 | HR 80 | Ht 69.0 in | Wt 212.0 lb

## 2020-06-21 DIAGNOSIS — I10 Essential (primary) hypertension: Secondary | ICD-10-CM

## 2020-06-21 DIAGNOSIS — G629 Polyneuropathy, unspecified: Secondary | ICD-10-CM

## 2020-06-21 DIAGNOSIS — E119 Type 2 diabetes mellitus without complications: Secondary | ICD-10-CM

## 2020-06-21 MED ORDER — HYDROCHLOROTHIAZIDE 12.5 MG PO CAPS: 12.5000 mg | ORAL_CAPSULE | Freq: Every day | ORAL | 1 refills | Status: DC

## 2020-06-21 MED ORDER — LISINOPRIL 20 MG PO TABS: 20.0000 mg | ORAL_TABLET | Freq: Every day | ORAL | 1 refills | Status: DC

## 2020-06-21 MED ORDER — METOPROLOL TARTRATE 50 MG PO TABS: 50.0000 mg | ORAL_TABLET | Freq: Two times a day (BID) | ORAL | 1 refills | Status: DC

## 2020-06-21 MED ORDER — PREGABALIN 100 MG PO CAPS: 100.0000 mg | ORAL_CAPSULE | Freq: Two times a day (BID) | ORAL | 5 refills | Status: DC

## 2020-06-21 NOTE — Progress Notes (Signed)
Date:  06/21/2020   Name:  Jerry Harmon   DOB:  11/05/1955   MRN:  [id number]   Chief Complaint: Hypertension, Peripheral Neuropathy, Gout, and Diabetes  Hypertension This is a chronic problem. The current episode started more than 1 year ago. The problem has been gradually improving since onset. The problem is controlled. Pertinent negatives include no anxiety, blurred vision, chest pain, headaches, malaise/fatigue, neck pain, orthopnea, palpitations, peripheral edema, PND, shortness of breath or sweats. There are no associated agents to hypertension. Past treatments include diuretics, ACE inhibitors and beta blockers. The current treatment provides moderate improvement. There are no compliance problems.  There is no history of angina, kidney disease, CAD/MI, CVA, heart failure, left ventricular hypertrophy, PVD or retinopathy. There is no history of chronic renal disease, a hypertension causing med or renovascular disease.  Diabetes He presents for his follow-up diabetic visit. He has type 2 diabetes mellitus. Pertinent negatives for hypoglycemia include no headaches or sweats. Pertinent negatives for diabetes include no blurred vision, no chest pain, no polydipsia, no polyphagia and no polyuria. Symptoms are stable. Pertinent negatives for diabetic complications include no CVA, PVD or retinopathy. Current diabetic treatment includes oral agent (dual therapy) (jardiance/metformen). He is compliant with treatment all of the time. His weight is stable. He is following a generally healthy diet. Meal planning includes avoidance of concentrated sweets and carbohydrate counting. His breakfast blood glucose is taken between 8-9 am. An ACE inhibitor/angiotensin II receptor blocker is being taken.    Lab Results  Component Value Date   CREATININE 1.15 01/06/2020   BUN 23 01/06/2020   NA 140 01/06/2020   K 4.4 01/06/2020   CL 102 01/06/2020   CO2 22 01/06/2020   Lab Results  Component Value Date    CHOL 166 01/06/2020   HDL 49 01/06/2020   LDLCALC 103 (H) 01/06/2020   TRIG 74 01/06/2020   CHOLHDL 3.8 01/12/2019   Lab Results  Component Value Date   TSH 2.600 03/12/2015   Lab Results  Component Value Date   HGBA1C 8.8 (H) 02/20/2020   Lab Results  Component Value Date   WBC 5.8 05/05/2019   HGB 13.3 (A) 05/05/2019   HCT 39 (A) 05/05/2019   PLT 191 05/05/2019   Lab Results  Component Value Date   ALT 63 (H) 07/01/2019   AST 45 (H) 07/01/2019   ALKPHOS 63 07/01/2019   BILITOT 0.3 07/01/2019     Review of Systems  Constitutional: Negative for malaise/fatigue.  Eyes: Negative for blurred vision.  Respiratory: Negative for shortness of breath.   Cardiovascular: Negative for chest pain, palpitations, orthopnea and PND.  Endocrine: Negative for polydipsia, polyphagia and polyuria.  Musculoskeletal: Negative for neck pain.  Neurological: Negative for headaches.    Patient Active Problem List   Diagnosis Date Noted  . Fever blister 07/01/2019  . Adjustment disorder 03/27/2016  . Neuropathy 11/13/2015  . Allergic rhinitis due to pollen 11/13/2015  . BP (high blood pressure) 09/11/2014  . Cardiomyopathy ([place]) 09/11/2014  . Sleep apnea 09/11/2014  . Blood pressure elevated without history of HTN 09/11/2014  . Erectile dysfunction 09/11/2014  . Lump in testis 09/11/2014  . 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 Cancer of testis, seminoma ([place]) 12/01/2013  . Benign prostatic hyperplasia with urinary obstruction 11/19/2013    No Known Allergies  Past Surgical History:  Procedure Laterality Date  . HERNIA REPAIR    . RADICAL ORCHIECTOMY      Social  History   Tobacco Use  . Smoking status: Former Research scientist (life sciences)  . Smokeless tobacco: Never Used  Substance Use Topics  . Alcohol use: No    Alcohol/week: 0.0 standard drinks  . Drug use: No     Medication list has been reviewed and updated.  Current Meds  Medication Sig  . allopurinol  (ZYLOPRIM) 100 MG tablet Take 200 mg by mouth daily. ortho  . aspirin 81 MG tablet Take 1 tablet by mouth daily.  Marland Kitchen glipiZIDE (GLUCOTROL) 5 MG tablet TAKE 1 TABLET (5 MG TOTAL) BY MOUTH DAILY BEFORE BREAKFAST.  . hydrochlorothiazide (MICROZIDE) 12.5 MG capsule Take 1 capsule (12.5 mg total) by mouth daily.  Marland Kitchen ibuprofen (ADVIL,MOTRIN) 200 MG tablet Take 200 mg by mouth as needed.  Marland Kitchen lisinopril (ZESTRIL) 20 MG tablet Take 1 tablet (20 mg total) by mouth daily.  Marland Kitchen loratadine (CLARITIN) 10 MG tablet TAKE 1 TABLET BY MOUTH EVERY DAY  . metFORMIN (GLUCOPHAGE) 500 MG tablet TAKE 1 TABLET BY MOUTH 2 TIMES DAILY WITH A MEAL.  . metoprolol tartrate (LOPRESSOR) 50 MG tablet Take 1 tablet (50 mg total) by mouth 2 (two) times daily.  . Multiple Vitamins-Minerals (CENTRUM SILVER ADULT 50+ PO) Take 1 tablet by mouth daily.  Glory Rosebush ULTRA test strip USE 1 STRIP TO TEST DAILY E11.9  . pregabalin (LYRICA) 100 MG capsule Take 1 capsule (100 mg total) by mouth 2 (two) times daily.  . sildenafil (REVATIO) 20 MG tablet ONE TABLET AS NEEDED BEFORE SEXUAL ACTIVITY  . traMADol (ULTRAM) 50 MG tablet Take 1 tablet (50 mg total) by mouth every 12 (twelve) hours as needed for moderate pain.  . valACYclovir (VALTREX) 1000 MG tablet Take 2 tablets (2,000 mg total) by mouth daily. 2 tablets today and 2 tomorrow.    PHQ 2/9 Scores 02/20/2020 01/05/2020 07/01/2019 01/12/2019  PHQ - 2 Score 0 0 0 0  PHQ- 9 Score 0 0 0 -    GAD 7 : Generalized Anxiety Score 02/20/2020 01/05/2020 07/01/2019  Nervous, Anxious, on Edge 0 0 0  Control/stop worrying 0 0 0  Worry too much - different things 0 0 0  Trouble relaxing 0 0 0  Restless 0 0 0  Easily annoyed or irritable 0 0 0  Afraid - awful might happen 0 0 0  Total GAD 7 Score 0 0 0    BP Readings from Last 3 Encounters:  06/21/20 124/68  02/20/20 120/62  01/05/20 120/60    Physical Exam  Wt Readings from Last 3 Encounters:  06/21/20 212 lb (96.2 kg)  02/20/20 223 lb  (101.2 kg)  01/05/20 225 lb (102.1 kg)    BP 124/68   Pulse 80   Ht 5\' 9"  (1.753 m)   Wt 212 lb (96.2 kg)   BMI 31.31 kg/m   Assessment and Plan: 1. Essential hypertension Chronic.  Controlled.  Stable.  Blood pressure today is 124/68.  We will continue metoprolol 50 mg 1 twice a day, lisinopril 20 mg once a day, and hydrochlorothiazide 12.5 mg once a day. - metoprolol tartrate (LOPRESSOR) 50 MG tablet; Take 1 tablet (50 mg total) by mouth 2 (two) times daily.  Dispense: 180 tablet; Refill: 1 - lisinopril (ZESTRIL) 20 MG tablet; Take 1 tablet (20 mg total) by mouth daily.  Dispense: 90 tablet; Refill: 1 - hydrochlorothiazide (MICROZIDE) 12.5 MG capsule; Take 1 capsule (12.5 mg total) by mouth daily.  Dispense: 90 capsule; Refill: 1  2. Neuropathy Chronic.  Controlled.  Stable.  Symptomatic control has been achieved with Lyrica 100 mg 1 twice a day as needed for pain. - pregabalin (LYRICA) 100 MG capsule; Take 1 capsule (100 mg total) by mouth 2 (two) times daily.  Dispense: 60 capsule; Refill: 5  3. New onset type 2 diabetes mellitus ([place]) New onset diabetes which is being followed by endocrine at this time.  Patient awaits upcoming appointment for which we will go ahead and obtain an A1c at this time. - HgB A1c

## 2020-06-22 LAB — HEMOGLOBIN A1C
Est. average glucose Bld gHb Est-mCnc: 160 mg/dL
Hgb A1c MFr Bld: 7.2 % — ABNORMAL HIGH (ref 4.8–5.6)

## 2020-06-28 ENCOUNTER — Other Ambulatory Visit: Payer: Self-pay | Admitting: Family Medicine

## 2020-06-28 DIAGNOSIS — E119 Type 2 diabetes mellitus without complications: Secondary | ICD-10-CM

## 2020-06-28 NOTE — Telephone Encounter (Signed)
Requested Prescriptions  Pending Prescriptions Disp Refills  . metFORMIN (GLUCOPHAGE) 500 MG tablet [Pharmacy Med Name: METFORMIN HCL 500 MG TABLET] 180 tablet 1    Sig: TAKE 1 TABLET BY MOUTH 2 TIMES DAILY WITH A MEAL.     Endocrinology:  Diabetes - Biguanides Passed - 06/28/2020  1:45 AM      Passed - Cr in normal range and within 360 days    Creatinine, Ser  Date Value Ref Range Status  01/06/2020 1.15 0.76 - 1.27 mg/dL Final         Passed - HBA1C is between 0 and 7.9 and within 180 days    Hemoglobin A1C  Date Value Ref Range Status  05/05/2019 7.6  Final   Hgb A1c MFr Bld  Date Value Ref Range Status  06/21/2020 7.2 (H) 4.8 - 5.6 % Final    Comment:             Prediabetes: 5.7 - 6.4          Diabetes: >6.4          Glycemic control for adults with diabetes: <7.0          Passed - eGFR in normal range and within 360 days    GFR calc Af Amer  Date Value Ref Range Status  01/06/2020 78 >59 mL/min/1.73 Final    Comment:    **[facility] currently reports eGFR in compliance with the current**   recommendations of the [employer]. [facility] will   update reporting as new guidelines are published from the NKF-ASN   Task force.    GFR calc non Af Amer  Date Value Ref Range Status  01/06/2020 68 >59 mL/min/1.73 Final         Passed - Valid encounter within last 6 months    Recent Outpatient Visits          1 week ago Essential hypertension   [place], MD   4 months ago New onset type 2 diabetes mellitus [facility])   [facility] Juline Patch, MD   5 months ago Essential hypertension   [place], MD   12 months ago Essential hypertension   [place], Deanna C, MD   1 year ago Essential hypertension   [place], Deanna C, MD      Future Appointments            In 6 months Juline Patch, MD [facility], [facility]

## 2020-07-24 DIAGNOSIS — E1140 Type 2 diabetes mellitus with diabetic neuropathy, unspecified: Secondary | ICD-10-CM | POA: Diagnosis not present

## 2020-07-24 DIAGNOSIS — E119 Type 2 diabetes mellitus without complications: Secondary | ICD-10-CM | POA: Diagnosis not present

## 2020-07-24 DIAGNOSIS — I10 Essential (primary) hypertension: Secondary | ICD-10-CM | POA: Diagnosis not present

## 2020-07-28 ENCOUNTER — Other Ambulatory Visit: Payer: Self-pay | Admitting: Family Medicine

## 2020-07-28 DIAGNOSIS — J301 Allergic rhinitis due to pollen: Secondary | ICD-10-CM

## 2020-07-28 NOTE — Telephone Encounter (Signed)
Requested Prescriptions  Pending Prescriptions Disp Refills  . loratadine (CLARITIN) 10 MG tablet [Pharmacy Med Name: LORATADINE 10 MG TABLET] 90 tablet 0    Sig: TAKE 1 TABLET BY MOUTH EVERY DAY     Ear, Nose, and Throat:  Antihistamines Passed - 07/28/2020  9:24 AM      Passed - Valid encounter within last 12 months    Recent Outpatient Visits          1 month ago Essential hypertension   [place], MD   5 months ago New onset type 2 diabetes mellitus [facility])   [facility] Juline Patch, MD   6 months ago Essential hypertension   [place], Deanna C, MD   1 year ago Essential hypertension   [place], Deanna C, MD   1 year ago Essential hypertension   [place], Deanna C, MD      Future Appointments            In 5 months Juline Patch, MD [facility], [facility]

## 2020-08-20 DIAGNOSIS — G4733 Obstructive sleep apnea (adult) (pediatric): Secondary | ICD-10-CM | POA: Diagnosis not present

## 2020-08-30 DIAGNOSIS — Z872 Personal history of diseases of the skin and subcutaneous tissue: Secondary | ICD-10-CM | POA: Diagnosis not present

## 2020-08-30 DIAGNOSIS — Z859 Personal history of malignant neoplasm, unspecified: Secondary | ICD-10-CM | POA: Diagnosis not present

## 2020-08-30 DIAGNOSIS — L578 Other skin changes due to chronic exposure to nonionizing radiation: Secondary | ICD-10-CM | POA: Diagnosis not present

## 2020-10-21 ENCOUNTER — Other Ambulatory Visit: Payer: Self-pay | Admitting: Family Medicine

## 2020-10-21 DIAGNOSIS — J301 Allergic rhinitis due to pollen: Secondary | ICD-10-CM

## 2020-10-21 NOTE — Telephone Encounter (Signed)
Requested Prescriptions  Pending Prescriptions Disp Refills  . loratadine (CLARITIN) 10 MG tablet [Pharmacy Med Name: LORATADINE 10 MG TABLET] 90 tablet 0    Sig: TAKE 1 TABLET BY MOUTH EVERY DAY     Ear, Nose, and Throat:  Antihistamines Passed - 10/21/2020  9:30 AM      Passed - Valid encounter within last 12 months    Recent Outpatient Visits          4 months ago Essential hypertension   [place], MD   8 months ago New onset type 2 diabetes mellitus [facility])   [facility] Juline Patch, MD   9 months ago Essential hypertension   [place], Deanna C, MD   1 year ago Essential hypertension   [place], Deanna C, MD   1 year ago Essential hypertension   [place], Deanna C, MD      Future Appointments            In 2 months Juline Patch, MD [facility], [facility]

## 2020-10-23 DIAGNOSIS — E119 Type 2 diabetes mellitus without complications: Secondary | ICD-10-CM | POA: Diagnosis not present

## 2020-10-23 DIAGNOSIS — E1140 Type 2 diabetes mellitus with diabetic neuropathy, unspecified: Secondary | ICD-10-CM | POA: Diagnosis not present

## 2020-10-23 DIAGNOSIS — I10 Essential (primary) hypertension: Secondary | ICD-10-CM | POA: Diagnosis not present

## 2020-11-19 DIAGNOSIS — G4733 Obstructive sleep apnea (adult) (pediatric): Secondary | ICD-10-CM | POA: Diagnosis not present

## 2020-12-22 ENCOUNTER — Other Ambulatory Visit: Payer: Self-pay | Admitting: Family Medicine

## 2020-12-22 DIAGNOSIS — E119 Type 2 diabetes mellitus without complications: Secondary | ICD-10-CM

## 2020-12-23 NOTE — Telephone Encounter (Signed)
Last RF 06/28/20 #180 1 RF soul have enough to last until 12/27/20

## 2020-12-27 ENCOUNTER — Other Ambulatory Visit: Payer: Self-pay

## 2020-12-27 ENCOUNTER — Ambulatory Visit (INDEPENDENT_AMBULATORY_CARE_PROVIDER_SITE_OTHER): Payer: BC Managed Care – PPO | Admitting: Family Medicine

## 2020-12-27 ENCOUNTER — Encounter: Payer: Self-pay | Admitting: Family Medicine

## 2020-12-27 VITALS — BP 132/72 | HR 88 | Ht 69.0 in | Wt 206.0 lb

## 2020-12-27 DIAGNOSIS — Z23 Encounter for immunization: Secondary | ICD-10-CM

## 2020-12-27 DIAGNOSIS — E119 Type 2 diabetes mellitus without complications: Secondary | ICD-10-CM

## 2020-12-27 DIAGNOSIS — J301 Allergic rhinitis due to pollen: Secondary | ICD-10-CM | POA: Diagnosis not present

## 2020-12-27 DIAGNOSIS — G629 Polyneuropathy, unspecified: Secondary | ICD-10-CM

## 2020-12-27 DIAGNOSIS — I10 Essential (primary) hypertension: Secondary | ICD-10-CM

## 2020-12-27 DIAGNOSIS — B001 Herpesviral vesicular dermatitis: Secondary | ICD-10-CM

## 2020-12-27 DIAGNOSIS — N528 Other male erectile dysfunction: Secondary | ICD-10-CM

## 2020-12-27 MED ORDER — LORATADINE 10 MG PO TABS: 10.0000 mg | ORAL_TABLET | Freq: Every day | ORAL | 1 refills | Status: DC

## 2020-12-27 MED ORDER — LISINOPRIL 20 MG PO TABS: 20.0000 mg | ORAL_TABLET | Freq: Every day | ORAL | 1 refills | Status: DC

## 2020-12-27 MED ORDER — METFORMIN HCL 500 MG PO TABS: 500.0000 mg | ORAL_TABLET | Freq: Two times a day (BID) | ORAL | 1 refills | Status: DC

## 2020-12-27 MED ORDER — HYDROCHLOROTHIAZIDE 12.5 MG PO CAPS: 12.5000 mg | ORAL_CAPSULE | Freq: Every day | ORAL | 1 refills | Status: DC

## 2020-12-27 MED ORDER — VALACYCLOVIR HCL 1 G PO TABS: 2000.0000 mg | ORAL_TABLET | Freq: Every day | ORAL | 1 refills | Status: DC

## 2020-12-27 MED ORDER — METOPROLOL TARTRATE 50 MG PO TABS: 50.0000 mg | ORAL_TABLET | Freq: Two times a day (BID) | ORAL | 1 refills | Status: DC

## 2020-12-27 MED ORDER — PREGABALIN 100 MG PO CAPS: 100.0000 mg | ORAL_CAPSULE | Freq: Two times a day (BID) | ORAL | 5 refills | Status: DC

## 2020-12-27 MED ORDER — SILDENAFIL CITRATE 20 MG PO TABS: ORAL_TABLET | ORAL | 0 refills | Status: DC

## 2020-12-27 NOTE — Progress Notes (Signed)
Date:  12/27/2020   Name:  Jerry Harmon   DOB:  09-27-1955   MRN:  [id number]   Chief Complaint: Hypertension, Erectile Dysfunction, Peripheral Neuropathy, and arm tingling (L) arm- wakes up with it "asleep")  Hypertension This is a chronic problem. The current episode started more than 1 year ago. The problem has been gradually improving since onset. The problem is controlled. Pertinent negatives include no anxiety, blurred vision, chest pain, headaches, malaise/fatigue, neck pain, orthopnea, palpitations, peripheral edema or shortness of breath. Risk factors for coronary artery disease include diabetes mellitus, male gender and dyslipidemia. Past treatments include ACE inhibitors, diuretics and beta blockers. The current treatment provides moderate improvement. There are no compliance problems.  There is no history of angina, kidney disease, CAD/MI, CVA, heart failure, left ventricular hypertrophy, PVD or retinopathy. There is no history of chronic renal disease, a hypertension causing med or renovascular disease.  Erectile Dysfunction This is a chronic problem. The current episode started more than 1 year ago. The problem has been gradually improving since onset. The nature of his difficulty is achieving erection. He reports no anxiety or performance anxiety. Irritative symptoms do not include frequency, nocturia or urgency. Pertinent negatives include no chills, dysuria or hematuria. The treatment provided moderate relief.  Diabetes He has type 2 diabetes mellitus. His disease course has been stable. There are no hypoglycemic associated symptoms. Pertinent negatives for hypoglycemia include no dizziness, headaches or nervousness/anxiousness. Pertinent negatives for diabetes include no blurred vision, no chest pain, no fatigue, no foot paresthesias, no foot ulcerations, no polydipsia, no polyphagia, no polyuria, no visual change, no weakness and no weight loss. There are no hypoglycemic  complications. Symptoms are stable. Pertinent negatives for diabetic complications include no CVA, PVD or retinopathy. Risk factors for coronary artery disease include stress. An ACE inhibitor/angiotensin II receptor blocker is being taken.   Lab Results  Component Value Date   CREATININE 1.15 01/06/2020   BUN 23 01/06/2020   NA 140 01/06/2020   K 4.4 01/06/2020   CL 102 01/06/2020   CO2 22 01/06/2020   Lab Results  Component Value Date   CHOL 166 01/06/2020   HDL 49 01/06/2020   LDLCALC 103 (H) 01/06/2020   TRIG 74 01/06/2020   CHOLHDL 3.8 01/12/2019   Lab Results  Component Value Date   TSH 2.600 03/12/2015   Lab Results  Component Value Date   HGBA1C 7.2 (H) 06/21/2020   Lab Results  Component Value Date   WBC 5.8 05/05/2019   HGB 13.3 (A) 05/05/2019   HCT 39 (A) 05/05/2019   PLT 191 05/05/2019   Lab Results  Component Value Date   ALT 63 (H) 07/01/2019   AST 45 (H) 07/01/2019   ALKPHOS 63 07/01/2019   BILITOT 0.3 07/01/2019     Review of Systems  Constitutional:  Negative for chills, fatigue, fever, malaise/fatigue and weight loss.  HENT:  Negative for drooling, ear discharge, ear pain and sore throat.   Eyes:  Negative for blurred vision.  Respiratory:  Negative for cough, shortness of breath and wheezing.   Cardiovascular:  Negative for chest pain, palpitations, orthopnea and leg swelling.  Gastrointestinal:  Negative for abdominal pain, blood in stool, constipation, diarrhea and nausea.  Endocrine: Negative for polydipsia, polyphagia and polyuria.  Genitourinary:  Negative for dysuria, frequency, hematuria, nocturia and urgency.  Musculoskeletal:  Negative for back pain, myalgias and neck pain.  Skin:  Negative for rash.  Allergic/Immunologic: Negative for environmental allergies.  Neurological:  Negative for dizziness, weakness and headaches.  Hematological:  Does not bruise/bleed easily.  Psychiatric/Behavioral:  Negative for suicidal ideas. The  patient is not nervous/anxious.    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  Current Meds  Medication Sig   allopurinol (ZYLOPRIM) 100 MG tablet Take 200 mg by mouth daily. ortho   aspirin 81 MG tablet Take 1 tablet by mouth daily.   hydrochlorothiazide (MICROZIDE) 12.5 MG capsule Take 1 capsule (12.5 mg total) by mouth daily.   ibuprofen (ADVIL,MOTRIN) 200 MG tablet Take 200 mg by mouth as needed.   lisinopril (ZESTRIL) 20 MG tablet Take 1 tablet (20 mg total) by mouth daily.   loratadine (CLARITIN) 10 MG tablet TAKE 1 TABLET BY MOUTH EVERY DAY   metFORMIN (GLUCOPHAGE) 500 MG tablet TAKE 1 TABLET BY MOUTH 2 TIMES DAILY WITH A MEAL.   metoprolol tartrate (LOPRESSOR) 50 MG tablet Take 1 tablet (50 mg total) by mouth 2 (two) times daily.   Multiple Vitamins-Minerals (CENTRUM SILVER ADULT 50+ PO) Take 1 tablet by mouth daily.   ONETOUCH ULTRA test strip USE 1 STRIP TO TEST DAILY E11.9   pregabalin (LYRICA) 100 MG capsule Take 1 capsule (100 mg total) by mouth 2 (two) times daily.   sildenafil (REVATIO) 20 MG  tablet ONE TABLET AS NEEDED BEFORE SEXUAL ACTIVITY   traMADol (ULTRAM) 50 MG tablet Take 1 tablet (50 mg total) by mouth every 12 (twelve) hours as needed for moderate pain.   valACYclovir (VALTREX) 1000 MG tablet Take 2 tablets (2,000 mg total) by mouth daily. 2 tablets today and 2 tomorrow.    PHQ 2/9 Scores 02/20/2020 01/05/2020 07/01/2019 01/12/2019  PHQ - 2 Score 0 0 0 0  PHQ- 9 Score 0 0 0 -    GAD 7 : Generalized Anxiety Score 02/20/2020 01/05/2020 07/01/2019  Nervous, Anxious, on Edge 0 0 0  Control/stop worrying 0 0 0  Worry too much - different things 0 0 0  Trouble relaxing 0 0 0  Restless 0 0 0  Easily annoyed or irritable 0 0 0  Afraid - awful might happen 0 0 0  Total GAD 7 Score 0 0 0    BP Readings from Last 3 Encounters:  12/27/20 132/72  06/21/20 124/68  02/20/20 120/62    Physical Exam Vitals and nursing note reviewed.  HENT:     Head: Normocephalic.     Right Ear: Tympanic membrane, ear canal and external ear normal.     Left Ear: Tympanic membrane, ear canal and external  ear normal.     Nose: Nose normal. No congestion or rhinorrhea.  Eyes:     General: No scleral icterus.       Right eye: No discharge.        Left eye: No discharge.     Conjunctiva/sclera: Conjunctivae normal.     Pupils: Pupils are equal, round, and reactive to light.  Neck:     Thyroid: No thyromegaly.     Vascular: No JVD.     Trachea: No tracheal deviation.  Cardiovascular:     Rate and Rhythm: Normal rate and regular rhythm.     Heart sounds: Normal heart sounds. No murmur heard.   No friction rub. No gallop.  Pulmonary:     Effort: No respiratory distress.     Breath sounds: Normal breath sounds. No wheezing, rhonchi or rales.  Abdominal:     General: Bowel sounds are normal.     Palpations: Abdomen is soft. There is no mass.     Tenderness: There is no abdominal tenderness. There is no guarding or rebound.  Musculoskeletal:        General: No tenderness. Normal range of  motion.     Cervical back: Normal range of motion and neck supple.  Lymphadenopathy:     Cervical: No cervical adenopathy.  Skin:    General: Skin is warm.     Findings: No rash.  Neurological:     Mental Status: He is alert and oriented to person, place, and time.     Cranial Nerves: No cranial nerve deficit.     Deep Tendon Reflexes: Reflexes are normal and symmetric.    Wt Readings from Last 3 Encounters:  12/27/20 206 lb (93.4 kg)  06/21/20 212 lb (96.2 kg)  02/20/20 223 lb (101.2 kg)    BP 132/72   Pulse 88   Ht 5\' 9"  (1.753 m)   Wt 206 lb (93.4 kg)   BMI 30.42 kg/m   Assessment and Plan:  1. Essential hypertension Chronic.  Controlled.  Stable.  Blood pressure today is 132/72.  Continue hydrochlorothiazide 12.5 mg once a day, lisinopril 20 mg once a day, and metformin 50 mg once twice a day.  Will check CMP for electrolytes and GFR. - hydrochlorothiazide (MICROZIDE) 12.5 MG capsule; Take 1 capsule (12.5 mg total) by mouth daily.  Dispense: 90 capsule; Refill: 1 - lisinopril (ZESTRIL) 20 MG tablet; Take 1 tablet (20 mg total) by mouth daily.  Dispense: 90 tablet; Refill: 1 - metoprolol tartrate (LOPRESSOR) 50 MG tablet; Take 1 tablet (50 mg total) by mouth 2 (two) times daily.  Dispense: 180 tablet; Refill: 1 - Comprehensive Metabolic Panel (CMET)  2. Seasonal allergic rhinitis due to pollen Chronic.  Episodic.  Stable.  Continue loratadine 10 mg once a day. - loratadine (CLARITIN) 10 MG tablet; Take 1 tablet (10 mg total) by mouth daily.  Dispense: 90 tablet; Refill: 1  3. New onset type 2 diabetes mellitus ([place]) Relatively new onset.  Patient has blood sugar readings in the 120 range.  Continue metformin 500 mg twice a day.  Will check A1c and microalbuminuria as well as CMP. - metFORMIN (GLUCOPHAGE) 500 MG tablet; Take 1 tablet (500 mg total) by mouth 2 (two) times daily with a meal.  Dispense: 180 tablet; Refill: 1 - HgB A1c - Microalbumin, urine - Comprehensive  Metabolic Panel (CMET)  4. Neuropathy Patient has neuropathy.  We will continue Lyrica 100 mg twice a day. - pregabalin (LYRICA) 100 MG capsule;  Take 1 capsule (100 mg total) by mouth 2 (two) times daily.  Dispense: 60 capsule; Refill: 5  5. Other male erectile dysfunction Chronic.  Stable.  Controlled.  Patient is tolerating and is doing well on sildenafil 20 mg 2-3 as needed. - sildenafil (REVATIO) 20 MG tablet; One tablet as needed before sexual activity  Dispense: 50 tablet; Refill: 0  6. Fever blister New onset.  Episodic.  Stable.  Will refill valacyclovir for availability should patient have outbreak. - valACYclovir (VALTREX) 1000 MG tablet; Take 2 tablets (2,000 mg total) by mouth daily. 2 tablets today and 2 tomorrow.  Dispense: 10 tablet; Refill: 1  7. Need for immunization against influenza Discussed and administered. - Flu Vaccine QUAD 52mo+IM (Fluarix, Fluzone & Alfiuria Quad PF)   Patient is having paresthesias in the medial nerve distribution of his left hand pain apparently he does have a history of carpal tunnel for which Dr. Jefm Bryant gave him splints in the past.  I have referred encouraged him to wear the splints while he is working with a computer which will keep his wrist in a neutral position and not exacerbate the median nerve.

## 2020-12-28 LAB — COMPREHENSIVE METABOLIC PANEL
ALT: 16 IU/L (ref 0–44)
AST: 14 IU/L (ref 0–40)
Albumin/Globulin Ratio: 1.9 (ref 1.2–2.2)
Albumin: 4.8 g/dL (ref 3.8–4.8)
Alkaline Phosphatase: 65 IU/L (ref 44–121)
BUN/Creatinine Ratio: 20 (ref 10–24)
BUN: 31 mg/dL — ABNORMAL HIGH (ref 8–27)
Bilirubin Total: 0.4 mg/dL (ref 0.0–1.2)
CO2: 25 mmol/L (ref 20–29)
Calcium: 9.7 mg/dL (ref 8.6–10.2)
Chloride: 100 mmol/L (ref 96–106)
Creatinine, Ser: 1.53 mg/dL — ABNORMAL HIGH (ref 0.76–1.27)
Globulin, Total: 2.5 g/dL (ref 1.5–4.5)
Glucose: 143 mg/dL — ABNORMAL HIGH (ref 65–99)
Potassium: 4.4 mmol/L (ref 3.5–5.2)
Sodium: 141 mmol/L (ref 134–144)
Total Protein: 7.3 g/dL (ref 6.0–8.5)
eGFR: 51 mL/min/{1.73_m2} — ABNORMAL LOW (ref >59)

## 2020-12-28 LAB — HEMOGLOBIN A1C
Est. average glucose Bld gHb Est-mCnc: 154 mg/dL
Hgb A1c MFr Bld: 7 % — ABNORMAL HIGH (ref 4.8–5.6)

## 2020-12-28 LAB — MICROALBUMIN, URINE: Microalbumin, Urine: <3 ug/mL

## 2020-12-31 ENCOUNTER — Other Ambulatory Visit: Payer: Self-pay

## 2020-12-31 DIAGNOSIS — E119 Type 2 diabetes mellitus without complications: Secondary | ICD-10-CM

## 2020-12-31 MED ORDER — METFORMIN HCL ER 750 MG PO TB24: 750.0000 mg | ORAL_TABLET | Freq: Every day | ORAL | 1 refills | Status: DC

## 2020-12-31 NOTE — Progress Notes (Signed)
Switched over to met XL- sent new med into [facility]

## 2021-01-27 ENCOUNTER — Other Ambulatory Visit: Payer: Self-pay | Admitting: Family Medicine

## 2021-01-27 DIAGNOSIS — E119 Type 2 diabetes mellitus without complications: Secondary | ICD-10-CM

## 2021-01-28 NOTE — Telephone Encounter (Signed)
Requested Prescriptions  Pending Prescriptions Disp Refills  . metFORMIN (GLUCOPHAGE-XR) 750 MG 24 hr tablet [Pharmacy Med Name: METFORMIN HCL ER 750 MG TABLET] 30 tablet 1    Sig: TAKE 1 TABLET BY MOUTH EVERY DAY WITH BREAKFAST     Endocrinology:  Diabetes - Biguanides Failed - 01/27/2021 12:31 PM      Failed - Cr in normal range and within 360 days    Creatinine, Ser  Date Value Ref Range Status  12/27/2020 1.53 (H) 0.76 - 1.27 mg/dL Final         Failed - eGFR in normal range and within 360 days    GFR calc Af Amer  Date Value Ref Range Status  01/06/2020 78 >59 mL/min/1.73 Final    Comment:    **[facility] currently reports eGFR in compliance with the current**   recommendations of the [employer]. [facility] will   update reporting as new guidelines are published from the NKF-ASN   Task force.    GFR calc non Af Amer  Date Value Ref Range Status  01/06/2020 68 >59 mL/min/1.73 Final   eGFR  Date Value Ref Range Status  12/27/2020 51 (L) >59 mL/min/1.73 Final         Passed - HBA1C is between 0 and 7.9 and within 180 days    Hemoglobin A1C  Date Value Ref Range Status  05/05/2019 7.6  Final   Hgb A1c MFr Bld  Date Value Ref Range Status  12/27/2020 7.0 (H) 4.8 - 5.6 % Final    Comment:             Prediabetes: 5.7 - 6.4          Diabetes: >6.4          Glycemic control for adults with diabetes: <7.0          Passed - Valid encounter within last 6 months    Recent Outpatient Visits          1 month ago Essential hypertension   [facility] Juline Patch, MD   7 months ago Essential hypertension   [place], Deanna C, MD   11 months ago New onset type 2 diabetes mellitus [facility])   [facility] Juline Patch, MD   1 year ago Essential hypertension   [facility] Juline Patch, MD   1 year ago Essential hypertension   [facility] Juline Patch, MD

## 2021-02-07 DIAGNOSIS — M79672 Pain in left foot: Secondary | ICD-10-CM | POA: Diagnosis not present

## 2021-02-07 DIAGNOSIS — T451X5A Adverse effect of antineoplastic and immunosuppressive drugs, initial encounter: Secondary | ICD-10-CM | POA: Diagnosis not present

## 2021-02-07 DIAGNOSIS — Z7982 Long term (current) use of aspirin: Secondary | ICD-10-CM | POA: Diagnosis not present

## 2021-02-07 DIAGNOSIS — Z7984 Long term (current) use of oral hypoglycemic drugs: Secondary | ICD-10-CM | POA: Diagnosis not present

## 2021-02-07 DIAGNOSIS — E1142 Type 2 diabetes mellitus with diabetic polyneuropathy: Secondary | ICD-10-CM | POA: Diagnosis not present

## 2021-02-07 DIAGNOSIS — R202 Paresthesia of skin: Secondary | ICD-10-CM | POA: Diagnosis not present

## 2021-02-07 DIAGNOSIS — I10 Essential (primary) hypertension: Secondary | ICD-10-CM | POA: Diagnosis not present

## 2021-02-07 DIAGNOSIS — E785 Hyperlipidemia, unspecified: Secondary | ICD-10-CM | POA: Diagnosis not present

## 2021-02-07 DIAGNOSIS — G5603 Carpal tunnel syndrome, bilateral upper limbs: Secondary | ICD-10-CM | POA: Diagnosis not present

## 2021-02-07 DIAGNOSIS — G620 Drug-induced polyneuropathy: Secondary | ICD-10-CM | POA: Diagnosis not present

## 2021-02-07 DIAGNOSIS — Z6830 Body mass index (BMI) 30.0-30.9, adult: Secondary | ICD-10-CM | POA: Diagnosis not present

## 2021-02-07 DIAGNOSIS — C6290 Malignant neoplasm of unspecified testis, unspecified whether descended or undescended: Secondary | ICD-10-CM | POA: Diagnosis not present

## 2021-02-07 DIAGNOSIS — M79671 Pain in right foot: Secondary | ICD-10-CM | POA: Diagnosis not present

## 2021-02-18 DIAGNOSIS — G4733 Obstructive sleep apnea (adult) (pediatric): Secondary | ICD-10-CM | POA: Diagnosis not present

## 2021-02-20 DIAGNOSIS — G4733 Obstructive sleep apnea (adult) (pediatric): Secondary | ICD-10-CM | POA: Diagnosis not present

## 2021-02-23 ENCOUNTER — Other Ambulatory Visit: Payer: Self-pay | Admitting: Family Medicine

## 2021-02-23 DIAGNOSIS — E119 Type 2 diabetes mellitus without complications: Secondary | ICD-10-CM

## 2021-02-24 ENCOUNTER — Other Ambulatory Visit: Payer: Self-pay | Admitting: Family Medicine

## 2021-02-24 DIAGNOSIS — E119 Type 2 diabetes mellitus without complications: Secondary | ICD-10-CM

## 2021-02-24 NOTE — Telephone Encounter (Signed)
Alternative requested/ must be 90 day refills per insurance.  Requested Prescriptions  Pending Prescriptions Disp Refills   metFORMIN (GLUCOPHAGE-XR) 750 MG 24 hr tablet [Pharmacy Med Name: METFORMIN HCL ER 750 MG TABLET] 30 tablet 1    Sig: TAKE 1 TABLET BY MOUTH [place]     Endocrinology:  Diabetes - Biguanides Failed - 02/24/2021 10:11 AM      Failed - Cr in normal range and within 360 days    Creatinine, Ser  Date Value Ref Range Status  12/27/2020 1.53 (H) 0.76 - 1.27 mg/dL Final          Failed - eGFR in normal range and within 360 days    GFR calc Af Amer  Date Value Ref Range Status  01/06/2020 78 >59 mL/min/1.73 Final    Comment:    **[facility] currently reports eGFR in compliance with the current**   recommendations of the [employer]. [facility] will   update reporting as new guidelines are published from the NKF-ASN   Task force.    GFR calc non Af Amer  Date Value Ref Range Status  01/06/2020 68 >59 mL/min/1.73 Final   eGFR  Date Value Ref Range Status  12/27/2020 51 (L) >59 mL/min/1.73 Final          Passed - HBA1C is between 0 and 7.9 and within 180 days    Hemoglobin A1C  Date Value Ref Range Status  05/05/2019 7.6  Final   Hgb A1c MFr Bld  Date Value Ref Range Status  12/27/2020 7.0 (H) 4.8 - 5.6 % Final    Comment:             Prediabetes: 5.7 - 6.4          Diabetes: >6.4          Glycemic control for adults with diabetes: <7.0           Passed - Valid encounter within last 6 months    Recent Outpatient Visits           1 month ago Essential hypertension   [facility] Juline Patch, MD   8 months ago Essential hypertension   [place], Deanna C, MD   1 year ago New onset type 2 diabetes mellitus [facility])   [facility] Juline Patch, MD   1 year ago Essential hypertension   [facility] Juline Patch, MD   1 year ago Essential hypertension   [facility] Juline Patch, MD

## 2021-02-24 NOTE — Telephone Encounter (Signed)
last RF 01/28/21 #30 1 RF requested too soon

## 2021-02-25 ENCOUNTER — Other Ambulatory Visit: Payer: Self-pay

## 2021-02-25 DIAGNOSIS — E119 Type 2 diabetes mellitus without complications: Secondary | ICD-10-CM

## 2021-02-25 MED ORDER — METFORMIN HCL ER 750 MG PO TB24: ORAL_TABLET | ORAL | 1 refills | Status: DC

## 2021-03-20 DIAGNOSIS — G4733 Obstructive sleep apnea (adult) (pediatric): Secondary | ICD-10-CM | POA: Diagnosis not present

## 2021-03-22 DIAGNOSIS — G4733 Obstructive sleep apnea (adult) (pediatric): Secondary | ICD-10-CM | POA: Diagnosis not present

## 2021-04-20 DIAGNOSIS — G4733 Obstructive sleep apnea (adult) (pediatric): Secondary | ICD-10-CM | POA: Diagnosis not present

## 2021-04-22 DIAGNOSIS — G4733 Obstructive sleep apnea (adult) (pediatric): Secondary | ICD-10-CM | POA: Diagnosis not present

## 2021-05-17 DIAGNOSIS — E1159 Type 2 diabetes mellitus with other circulatory complications: Secondary | ICD-10-CM | POA: Diagnosis not present

## 2021-05-17 DIAGNOSIS — E1169 Type 2 diabetes mellitus with other specified complication: Secondary | ICD-10-CM | POA: Diagnosis not present

## 2021-05-17 DIAGNOSIS — E785 Hyperlipidemia, unspecified: Secondary | ICD-10-CM | POA: Diagnosis not present

## 2021-05-17 DIAGNOSIS — I152 Hypertension secondary to endocrine disorders: Secondary | ICD-10-CM | POA: Diagnosis not present

## 2021-05-20 DIAGNOSIS — G4733 Obstructive sleep apnea (adult) (pediatric): Secondary | ICD-10-CM | POA: Diagnosis not present

## 2021-05-30 DIAGNOSIS — C6291 Malignant neoplasm of right testis, unspecified whether descended or undescended: Secondary | ICD-10-CM | POA: Diagnosis not present

## 2021-05-30 DIAGNOSIS — Z6831 Body mass index (BMI) 31.0-31.9, adult: Secondary | ICD-10-CM | POA: Diagnosis not present

## 2021-05-31 ENCOUNTER — Other Ambulatory Visit: Payer: Self-pay | Admitting: Family Medicine

## 2021-06-17 DIAGNOSIS — G4733 Obstructive sleep apnea (adult) (pediatric): Secondary | ICD-10-CM | POA: Diagnosis not present

## 2021-06-23 ENCOUNTER — Other Ambulatory Visit: Payer: Self-pay | Admitting: Family Medicine

## 2021-06-23 DIAGNOSIS — E119 Type 2 diabetes mellitus without complications: Secondary | ICD-10-CM

## 2021-06-23 DIAGNOSIS — J301 Allergic rhinitis due to pollen: Secondary | ICD-10-CM

## 2021-06-30 ENCOUNTER — Other Ambulatory Visit: Payer: Self-pay | Admitting: Family Medicine

## 2021-06-30 DIAGNOSIS — G629 Polyneuropathy, unspecified: Secondary | ICD-10-CM

## 2021-07-01 ENCOUNTER — Ambulatory Visit
Admission: RE | Admit: 2021-07-01 | Discharge: 2021-07-01 | Disposition: A | Discharge status: Home / Self Care | Payer: BC Managed Care – PPO | Source: Ambulatory Visit | Attending: Family Medicine | Admitting: Family Medicine

## 2021-07-01 ENCOUNTER — Other Ambulatory Visit: Payer: Self-pay

## 2021-07-01 ENCOUNTER — Ambulatory Visit
Admission: RE | Admit: 2021-07-01 | Discharge: 2021-07-01 | Disposition: A | Discharge status: Home / Self Care | Payer: BC Managed Care – PPO | Attending: Family Medicine | Admitting: Family Medicine

## 2021-07-01 ENCOUNTER — Telehealth: Payer: Self-pay

## 2021-07-01 DIAGNOSIS — M79671 Pain in right foot: Secondary | ICD-10-CM

## 2021-07-01 NOTE — Telephone Encounter (Signed)
Copied from [place] [id number]. Topic: General - Other ?>> Jul 01, 2021 10:22 AM Yvette Rack wrote: ?Reason for CRM: Pt requests call back from [facility]. Cb# [phone] ?

## 2021-07-02 ENCOUNTER — Encounter: Payer: Self-pay | Admitting: Family Medicine

## 2021-07-02 ENCOUNTER — Ambulatory Visit (INDEPENDENT_AMBULATORY_CARE_PROVIDER_SITE_OTHER): Payer: BC Managed Care – PPO | Admitting: Family Medicine

## 2021-07-02 VITALS — BP 128/86 | HR 76 | Ht 71.0 in | Wt 216.0 lb

## 2021-07-02 DIAGNOSIS — S92511A Displaced fracture of proximal phalanx of right lesser toe(s), initial encounter for closed fracture: Secondary | ICD-10-CM

## 2021-07-02 DIAGNOSIS — S92911A Unspecified fracture of right toe(s), initial encounter for closed fracture: Secondary | ICD-10-CM | POA: Diagnosis not present

## 2021-07-02 MED ORDER — VITAMIN D (ERGOCALCIFEROL) 1.25 MG (50000 UNIT) PO CAPS
50000.0000 [IU] | ORAL_CAPSULE | ORAL | 0 refills | Status: DC
Start: 2021-07-02 — End: 2021-08-06

## 2021-07-02 NOTE — Progress Notes (Signed)
?  ? ?  Primary Care / Sports Medicine Office Visit ? ?Patient Information:  ?Patient ID: Jerry Harmon, male DOB: 08-23-1955 Age: 66 y.o. MRN: [id number]  ? ?Jerry Harmon is a pleasant 66 y.o. male presenting with the following: ? ?Chief Complaint  ?Patient presents with  ? Foot pain  ?  Right foot, does have xrays, kicked door frame 03/06, 3rd and 4th toe turned blue, healed, now the 5th toe has started turning blue. Has been tapping. Wants it checked because he has Diabetes and neuropathy.    ? ? ?Vitals:  ? 07/02/21 1350  ?BP: 128/86  ?Pulse: 76  ?SpO2: 98%  ? ?Vitals:  ? 07/02/21 1350  ?Weight: 216 lb (98 kg)  ?Height: '5\' 11"'$  (1.803 m)  ? ?Body mass index is 30.13 kg/m?. ? ?No results found.  ? ?Independent interpretation of notes and tests performed by another provider:  ? ?Independent interpretation of right foot x-ray dated 07/01/2021 reveals scattered degenerative changes about the foot with acute displaced fracture of the fourth proximal phalanx, oblique in nature, overall anatomic alignment relatively preserved. ? ?Procedures performed:  ? ?None ? ?Pertinent History, Exam, Impression, and Recommendations:  ? ?Closed displaced fracture of proximal phalanx of lesser toe of right foot ?Patient with medical history significant for type 2 diabetes mellitus, presents with acute and traumatic onset of right foot pain after striking his foot in a door frame on 06/10/2021.  He states that at the onset he had severe and sharp pain primarily to the third and fourth toes, had swelling, ecchymosis.  The symptoms gradually resolved with time and he has continued to bear weight with lessening pain.  Given his persistent symptomatology, he requested a visit for further evaluation. ? ?Examination today reveals no abnormalities to inspection, his gross sensation throughout his foot is intact, he has preserved motor function, he has focal tenderness at the fourth proximal phalanx distally-midshaft, no crepitus appreciated,  provocative testing otherwise negative.  His x-rays are consistent with a fracture to the same side ? ?Given patient stated clinical history, physical exam and radiographic findings, he has a mildly displaced fourth proximal phalanx fracture of his right foot in the setting of type 2 diabetes.  I have advised postop shoe, Rx vitamin D weekly x8 weeks, daily calcium, buddy tape between the third and fourth toes, and close daily vigilance to his foot for any skin breakdown issues.  If the latter were to arise, he was advised to contact our office for next steps.  He is to start home exercises in 2-3 weeks to minimize deconditioning. ? ?He will return in 5 weeks with new x-rays of the right foot for reevaluation.  If stable/improving, anticipate gradual wean from postop shoe, finish out Rx vitamin D, and ramp up home exercises.  If suboptimal progress, anticipate prolonged immobilization and another follow-up.  ? ?Orders & Medications ?Meds ordered this encounter  ?Medications  ? Vitamin D, Ergocalciferol, (DRISDOL) 1.25 MG (50000 UNIT) CAPS capsule  ?  Sig: Take 1 capsule (50,000 Units total) by mouth every 7 (seven) days. Take for 8 total doses(weeks)  ?  Dispense:  8 capsule  ?  Refill:  0  ? ?Orders Placed This Encounter  ?Procedures  ? DG Foot Complete Right  ?  ? ?Return in about 5 weeks (around 08/06/2021).  ?  ? ?Montel Culver, MD ? ? Primary Care Sports Medicine ?[facility] ?[place]  ? ?

## 2021-07-02 NOTE — Patient Instructions (Addendum)
-   Wear postop shoe as discussed (okay to remove for periods of prolonged time off of your feet, sleeping, bathing) ?- Use buddy tape to tape third and fourth toes together, ensure that there is gauze between the toes ?- Check the foot daily for any skin breakdown, ulcers, blisters, etc.; if noted, contact our office ?- Start Rx weekly vitamin D ?- Start over-the-counter calcium 2000 mg daily ?- Start home exercises with information provided after 2-3 weeks using symptoms as a guide for gradual progression ?- Return for follow-up in 5 weeks, obtain new x-rays 1 day prior ?

## 2021-07-02 NOTE — Assessment & Plan Note (Addendum)
Patient with medical history significant for type 2 diabetes mellitus, presents with acute and traumatic onset of right foot pain after striking his foot in a door frame on 06/10/2021.  He states that at the onset he had severe and sharp pain primarily to the third and fourth toes, had swelling, ecchymosis.  The symptoms gradually resolved with time and he has continued to bear weight with lessening pain.  Given his persistent symptomatology, he requested a visit for further evaluation. ? ?Examination today reveals no abnormalities to inspection, his gross sensation throughout his foot is intact, he has preserved motor function, he has focal tenderness at the fourth proximal phalanx distally-midshaft, no crepitus appreciated, provocative testing otherwise negative.  His x-rays are consistent with a fracture to the same side ? ?Given patient stated clinical history, physical exam and radiographic findings, he has a mildly displaced fourth proximal phalanx fracture of his right foot in the setting of type 2 diabetes.  I have buddy taped his third and fourth toes on the right foot.  Additionally, I have advised postop shoe, Rx vitamin D weekly x8 weeks, daily calcium, buddy tape between the third and fourth toes, and close daily vigilance to his foot for any skin breakdown issues.  If the latter were to arise, he was advised to contact our office for next steps.  He is to start home exercises in 2-3 weeks to minimize deconditioning. ? ?He will return in 5 weeks with new x-rays of the right foot for reevaluation.  If stable/improving, anticipate gradual wean from postop shoe, finish out Rx vitamin D, and ramp up home exercises.  If suboptimal progress, anticipate prolonged immobilization and another follow-up. ?

## 2021-07-12 ENCOUNTER — Other Ambulatory Visit: Payer: Self-pay | Admitting: Family Medicine

## 2021-07-12 DIAGNOSIS — I10 Essential (primary) hypertension: Secondary | ICD-10-CM

## 2021-07-12 NOTE — Telephone Encounter (Signed)
Requested medication (s) are due for refill today: yes ? ?Requested medication (s) are on the active medication list: yes ? ?Last refill:  12/27/20 #90/1 ? ?Future visit scheduled: yes ? ?Notes to clinic:  Unable to refill per protocol due to failed labs, no updated results.  ? ? ?  ?Requested Prescriptions  ?Pending Prescriptions Disp Refills  ? hydrochlorothiazide (MICROZIDE) 12.5 MG capsule [Pharmacy Med Name: HYDROCHLOROTHIAZIDE 12.5 MG CP] 90 capsule 1  ?  Sig: TAKE 1 CAPSULE BY MOUTH EVERY DAY  ?  ? Cardiovascular: Diuretics - Thiazide Failed - 07/12/2021  2:28 AM  ?  ?  Failed - Cr in normal range and within 180 days  ?  Creatinine, Ser  ?Date Value Ref Range Status  ?12/27/2020 1.53 (H) 0.76 - 1.27 mg/dL Final  ?  ?  ?  ?  Failed - K in normal range and within 180 days  ?  Potassium  ?Date Value Ref Range Status  ?12/27/2020 4.4 3.5 - 5.2 mmol/L Final  ?  ?  ?  ?  Failed - Na in normal range and within 180 days  ?  Sodium  ?Date Value Ref Range Status  ?12/27/2020 141 134 - 144 mmol/L Final  ?  ?  ?  ?  Failed - Valid encounter within last 6 months  ?  Recent Outpatient Visits   ? ?      ? 1 week ago Closed displaced fracture of proximal phalanx of lesser toe of right foot, initial encounter  ? [facility] Montel Culver, MD  ? 6 months ago Essential hypertension  ? [facility] Juline Patch, MD  ? 1 year ago Essential hypertension  ? [facility] Juline Patch, MD  ? 1 year ago New onset type 2 diabetes mellitus ([place])  ? [facility] Juline Patch, MD  ? 1 year ago Essential hypertension  ? [facility] Juline Patch, MD  ? ?  ?  ?Future Appointments   ? ?        ? In 3 weeks Zigmund Daniel Earley Abide, MD [facility]  ? ?  ? ?  ?  ?  Passed - Last BP in normal range  ?  BP Readings from Last 1 Encounters:  ?07/02/21 128/86  ?  ?  ?  ?  ? ?

## 2021-07-15 ENCOUNTER — Other Ambulatory Visit: Payer: Self-pay | Admitting: Family Medicine

## 2021-07-15 DIAGNOSIS — I10 Essential (primary) hypertension: Secondary | ICD-10-CM

## 2021-07-17 ENCOUNTER — Other Ambulatory Visit: Payer: Self-pay | Admitting: Family Medicine

## 2021-07-18 DIAGNOSIS — G4733 Obstructive sleep apnea (adult) (pediatric): Secondary | ICD-10-CM | POA: Diagnosis not present

## 2021-07-18 NOTE — Telephone Encounter (Signed)
Requested medication (s) are due for refill today: Yes ? ?Requested medication (s) are on the active medication list: Yes ? ?Last refill:  11/13/14 ? ?Future visit scheduled: Yes ? ?Notes to clinic:  Prescription expired, historical provider. ? ? ? ?Requested Prescriptions  ?Pending Prescriptions Disp Refills  ? pregabalin (LYRICA) 100 MG capsule [Pharmacy Med Name: PREGABALIN 100 MG CAPSULE] 30 capsule 0  ?  Sig: TAKE 1 CAPSULE BY MOUTH TWICE A DAY  ?  ? Not Delegated - Neurology:  Anticonvulsants - Controlled - pregabalin Failed - 07/17/2021  1:16 PM  ?  ?  Failed - This refill cannot be delegated  ?  ?  Failed - Cr in normal range and within 360 days  ?  Creatinine, Ser  ?Date Value Ref Range Status  ?12/27/2020 1.53 (H) 0.76 - 1.27 mg/dL Final  ?  ?  ?  ?  Passed - Completed PHQ-2 or PHQ-9 in the last 360 days  ?  ?  Passed - Valid encounter within last 12 months  ?  Recent Outpatient Visits   ? ?      ? 2 weeks ago Closed displaced fracture of proximal phalanx of lesser toe of right foot, initial encounter  ? [facility] Montel Culver, MD  ? 6 months ago Essential hypertension  ? [facility] Juline Patch, MD  ? 1 year ago Essential hypertension  ? [facility] Juline Patch, MD  ? 1 year ago New onset type 2 diabetes mellitus ([place])  ? [facility] Juline Patch, MD  ? 1 year ago Essential hypertension  ? [facility] Juline Patch, MD  ? ?  ?  ?Future Appointments   ? ?        ? In 6 days Juline Patch, MD [facility] [place]  ? In 2 weeks Zigmund Daniel Earley Abide, MD [facility] [place]  ? ?  ? ?  ?  ?  ? ?

## 2021-07-19 ENCOUNTER — Other Ambulatory Visit: Payer: Self-pay | Admitting: Family Medicine

## 2021-07-23 ENCOUNTER — Other Ambulatory Visit: Payer: Self-pay | Admitting: Family Medicine

## 2021-07-23 DIAGNOSIS — S92511A Displaced fracture of proximal phalanx of right lesser toe(s), initial encounter for closed fracture: Secondary | ICD-10-CM

## 2021-07-24 ENCOUNTER — Ambulatory Visit (INDEPENDENT_AMBULATORY_CARE_PROVIDER_SITE_OTHER): Payer: BC Managed Care – PPO | Admitting: Family Medicine

## 2021-07-24 ENCOUNTER — Encounter: Payer: Self-pay | Admitting: Family Medicine

## 2021-07-24 VITALS — BP 124/76 | HR 76 | Ht 71.0 in | Wt 219.0 lb

## 2021-07-24 DIAGNOSIS — J301 Allergic rhinitis due to pollen: Secondary | ICD-10-CM | POA: Diagnosis not present

## 2021-07-24 DIAGNOSIS — E7801 Familial hypercholesterolemia: Secondary | ICD-10-CM | POA: Diagnosis not present

## 2021-07-24 DIAGNOSIS — N528 Other male erectile dysfunction: Secondary | ICD-10-CM

## 2021-07-24 DIAGNOSIS — B001 Herpesviral vesicular dermatitis: Secondary | ICD-10-CM | POA: Diagnosis not present

## 2021-07-24 DIAGNOSIS — I10 Essential (primary) hypertension: Secondary | ICD-10-CM | POA: Diagnosis not present

## 2021-07-24 DIAGNOSIS — Z23 Encounter for immunization: Secondary | ICD-10-CM

## 2021-07-24 DIAGNOSIS — T451X5A Adverse effect of antineoplastic and immunosuppressive drugs, initial encounter: Secondary | ICD-10-CM

## 2021-07-24 DIAGNOSIS — Z6830 Body mass index (BMI) 30.0-30.9, adult: Secondary | ICD-10-CM

## 2021-07-24 DIAGNOSIS — G620 Drug-induced polyneuropathy: Secondary | ICD-10-CM

## 2021-07-24 MED ORDER — LORATADINE 10 MG PO TABS: 10.0000 mg | ORAL_TABLET | Freq: Every day | ORAL | 0 refills | Status: DC

## 2021-07-24 MED ORDER — LISINOPRIL 20 MG PO TABS: 20.0000 mg | ORAL_TABLET | Freq: Every day | ORAL | 1 refills | Status: DC

## 2021-07-24 MED ORDER — PREGABALIN 100 MG PO CAPS: 100.0000 mg | ORAL_CAPSULE | Freq: Two times a day (BID) | ORAL | 5 refills | Status: DC

## 2021-07-24 MED ORDER — SILDENAFIL CITRATE 20 MG PO TABS: ORAL_TABLET | ORAL | 0 refills | Status: DC

## 2021-07-24 MED ORDER — VALACYCLOVIR HCL 1 G PO TABS: 2000.0000 mg | ORAL_TABLET | Freq: Every day | ORAL | 1 refills | Status: DC

## 2021-07-24 MED ORDER — METOPROLOL TARTRATE 50 MG PO TABS: 50.0000 mg | ORAL_TABLET | Freq: Two times a day (BID) | ORAL | 1 refills | Status: DC

## 2021-07-24 MED ORDER — HYDROCHLOROTHIAZIDE 12.5 MG PO CAPS: 12.5000 mg | ORAL_CAPSULE | Freq: Every day | ORAL | 1 refills | Status: DC

## 2021-07-24 NOTE — Patient Instructions (Signed)
Calorie Counting for Weight Loss ?Calories are units of energy. Your body needs a certain number of calories from food to keep going throughout the day. When you eat or drink more calories than your body needs, your body stores the extra calories mostly as fat. When you eat or drink fewer calories than your body needs, your body burns fat to get the energy it needs. ?Calorie counting means keeping track of how many calories you eat and drink each day. Calorie counting can be helpful if you need to lose weight. If you eat fewer calories than your body needs, you should lose weight. Ask your health care provider what a healthy weight is for you. ?For calorie counting to work, you will need to eat the right number of calories each day to lose a healthy amount of weight per week. A dietitian can help you figure out how many calories you need in a day and will suggest ways to reach your calorie goal. ?A healthy amount of weight to lose each week is usually 1-2 lb (0.5-0.9 kg). This usually means that your daily calorie intake should be reduced by 500-750 calories. ?Eating 1,200-1,500 calories a day can help most women lose weight. ?Eating 1,500-1,800 calories a day can help most men lose weight. ?What do I need to know about calorie counting? ?Work with your health care provider or dietitian to determine how many calories you should get each day. To meet your daily calorie goal, you will need to: ?Find out how many calories are in each food that you would like to eat. Try to do this before you eat. ?Decide how much of the food you plan to eat. ?Keep a food log. Do this by writing down what you ate and how many calories it had. ?To successfully lose weight, it is important to balance calorie counting with a healthy lifestyle that includes regular activity. ?Where do I find calorie information? ? ?The number of calories in a food can be found on a Nutrition Facts label. If a food does not have a Nutrition Facts label, try  to look up the calories online or ask your dietitian for help. ?Remember that calories are listed per serving. If you choose to have more than one serving of a food, you will have to multiply the calories per serving by the number of servings you plan to eat. For example, the label on a package of bread might say that a serving size is 1 slice and that there are 90 calories in a serving. If you eat 1 slice, you will have eaten 90 calories. If you eat 2 slices, you will have eaten 180 calories. ?How do I keep a food log? ?After each time that you eat, record the following in your food log as soon as possible: ?What you ate. Be sure to include toppings, sauces, and other extras on the food. ?How much you ate. This can be measured in cups, ounces, or number of items. ?How many calories were in each food and drink. ?The total number of calories in the food you ate. ?Keep your food log near you, such as in a pocket-sized notebook or on an app or website on your mobile phone. Some programs will calculate calories for you and show you how many calories you have left to meet your daily goal. ?What are some portion-control tips? ?Know how many calories are in a serving. This will help you know how many servings you can have of a certain  food. ?Use a measuring cup to measure serving sizes. You could also try weighing out portions on a kitchen scale. With time, you will be able to estimate serving sizes for some foods. ?Take time to put servings of different foods on your favorite plates or in your favorite bowls and cups so you know what a serving looks like. ?Try not to eat straight from a food's packaging, such as from a bag or box. Eating straight from the package makes it hard to see how much you are eating and can lead to overeating. Put the amount you would like to eat in a cup or on a plate to make sure you are eating the right portion. ?Use smaller plates, glasses, and bowls for smaller portions and to prevent  overeating. ?Try not to multitask. For example, avoid watching TV or using your computer while eating. If it is time to eat, sit down at a table and enjoy your food. This will help you recognize when you are full. It will also help you be more mindful of what and how much you are eating. ?What are tips for following this plan? ?Reading food labels ?Check the calorie count compared with the serving size. The serving size may be smaller than what you are used to eating. ?Check the source of the calories. Try to choose foods that are high in protein, fiber, and vitamins, and low in saturated fat, trans fat, and sodium. ?Shopping ?Read nutrition labels while you shop. This will help you make healthy decisions about which foods to buy. ?Pay attention to nutrition labels for low-fat or fat-free foods. These foods sometimes have the same number of calories or more calories than the full-fat versions. They also often have added sugar, starch, or salt to make up for flavor that was removed with the fat. ?Make a grocery list of lower-calorie foods and stick to it. ?Cooking ?Try to cook your favorite foods in a healthier way. For example, try baking instead of frying. ?Use low-fat dairy products. ?Meal planning ?Use more fruits and vegetables. One-half of your plate should be fruits and vegetables. ?Include lean proteins, such as chicken, [place], and fish. ?Lifestyle ?Each week, aim to do one of the following: ?150 minutes of moderate exercise, such as walking. ?75 minutes of vigorous exercise, such as running. ?General information ?Know how many calories are in the foods you eat most often. This will help you calculate calorie counts faster. ?Find a way of tracking calories that works for you. Get creative. Try different apps or programs if writing down calories does not work for you. ?What foods should I eat? ? ?Eat nutritious foods. It is better to have a nutritious, high-calorie food, such as an avocado, than a food with  few nutrients, such as a bag of potato chips. ?Use your calories on foods and drinks that will fill you up and will not leave you hungry soon after eating. ?Examples of foods that fill you up are nuts and nut butters, vegetables, lean proteins, and high-fiber foods such as whole grains. High-fiber foods are foods with more than 5 g of fiber per serving. ?Pay attention to calories in drinks. Low-calorie drinks include water and unsweetened drinks. ?The items listed above may not be a complete list of foods and beverages you can eat. Contact a dietitian for more information. ?What foods should I limit? ?Limit foods or drinks that are not good sources of vitamins, minerals, or protein or that are high in unhealthy fats. These  include: ?Candy. ?Other sweets. ?Sodas, specialty coffee drinks, alcohol, and juice. ?The items listed above may not be a complete list of foods and beverages you should avoid. Contact a dietitian for more information. ?How do I count calories when eating out? ?Pay attention to portions. Often, portions are much larger when eating out. Try these tips to keep portions smaller: ?Consider sharing a meal instead of getting your own. ?If you get your own meal, eat only half of it. Before you start eating, ask for a container and put half of your meal into it. ?When available, consider ordering smaller portions from the menu instead of full portions. ?Pay attention to your food and drink choices. Knowing the way food is cooked and what is included with the meal can help you eat fewer calories. ?If calories are listed on the menu, choose the lower-calorie options. ?Choose dishes that include vegetables, fruits, whole grains, low-fat dairy products, and lean proteins. ?Choose items that are boiled, broiled, grilled, or steamed. Avoid items that are buttered, battered, fried, or served with cream sauce. Items labeled as crispy are usually fried, unless stated otherwise. ?Choose water, low-fat milk,  unsweetened iced tea, or other drinks without added sugar. If you want an alcoholic beverage, choose a lower-calorie option, such as a glass of wine or light beer. ?Ask for dressings, sauces, and syrups on the side.

## 2021-07-24 NOTE — Progress Notes (Signed)
? ? ?Date:  07/24/2021  ? ?Name:  Jerry Harmon   DOB:  Nov 23, 1955   MRN:  [id number] ? ? ?Chief Complaint: Allergic Rhinitis , Hypertension, Erectile Dysfunction, fever blisters, and shingles vacc need ? ?Hypertension ?This is a chronic problem. The current episode started more than 1 year ago. The problem has been gradually improving since onset. The problem is controlled. Pertinent negatives include no anxiety, blurred vision, chest pain, headaches, neck pain, orthopnea, palpitations, PND or shortness of breath. Past treatments include ACE inhibitors, beta blockers and diuretics. The current treatment provides moderate improvement. There are no compliance problems.  There is no history of angina, kidney disease, CAD/MI, CVA, heart failure, left ventricular hypertrophy, PVD or retinopathy. There is no history of chronic renal disease, a hypertension causing med or renovascular disease.  ?Erectile Dysfunction ?The current episode started more than 1 year ago. The problem has been gradually improving since onset. The nature of his difficulty is achieving erection. He reports no anxiety. Irritative symptoms do not include frequency, nocturia or urgency. Pertinent negatives include no chills, dysuria or hematuria. Past treatments include sildenafil.  ? ?Lab Results  ?Component Value Date  ? NA 141 12/27/2020  ? K 4.4 12/27/2020  ? CO2 25 12/27/2020  ? GLUCOSE 143 (H) 12/27/2020  ? BUN 31 (H) 12/27/2020  ? CREATININE 1.53 (H) 12/27/2020  ? CALCIUM 9.7 12/27/2020  ? EGFR 51 (L) 12/27/2020  ? GFRNONAA 68 01/06/2020  ? ?Lab Results  ?Component Value Date  ? CHOL 166 01/06/2020  ? HDL 49 01/06/2020  ? LDLCALC 103 (H) 01/06/2020  ? TRIG 74 01/06/2020  ? CHOLHDL 3.8 01/12/2019  ? ?Lab Results  ?Component Value Date  ? TSH 2.600 03/12/2015  ? ?Lab Results  ?Component Value Date  ? HGBA1C 7.0 (H) 12/27/2020  ? ?Lab Results  ?Component Value Date  ? WBC 5.8 05/05/2019  ? HGB 13.3 (A) 05/05/2019  ? HCT 39 (A) 05/05/2019  ? PLT 191  05/05/2019  ? ?Lab Results  ?Component Value Date  ? ALT 16 12/27/2020  ? AST 14 12/27/2020  ? ALKPHOS 65 12/27/2020  ? BILITOT 0.4 12/27/2020  ? ?No results found for: 25OHVITD2, [place], VD25OH  ? ?Review of Systems  ?Constitutional:  Negative for chills and fever.  ?HENT:  Negative for drooling, ear discharge, ear pain and sore throat.   ?Eyes:  Negative for blurred vision.  ?Respiratory:  Negative for cough, shortness of breath and wheezing.   ?Cardiovascular:  Negative for chest pain, palpitations, orthopnea, leg swelling and PND.  ?Gastrointestinal:  Negative for abdominal pain, blood in stool, constipation, diarrhea and nausea.  ?Endocrine: Negative for polydipsia.  ?Genitourinary:  Negative for dysuria, frequency, hematuria, nocturia and urgency.  ?Musculoskeletal:  Negative for back pain, myalgias and neck pain.  ?Skin:  Negative for rash.  ?Allergic/Immunologic: Negative for environmental allergies.  ?Neurological:  Negative for dizziness and headaches.  ?Hematological:  Does not bruise/bleed easily.  ?Psychiatric/Behavioral:  Negative for suicidal ideas. The patient is not nervous/anxious.   ? ?Patient Active Problem List  ? Diagnosis Date Noted  ? Closed displaced fracture of proximal phalanx of lesser toe of right foot 07/02/2021  ? Fever blister 07/01/2019  ? Pain of left hand 03/07/2019  ? Adjustment disorder 03/27/2016  ? Neuropathy 11/13/2015  ? Allergic rhinitis due to pollen 11/13/2015  ? BP (high blood pressure) 09/11/2014  ? Cardiomyopathy ([place]) 09/11/2014  ? Sleep apnea 09/11/2014  ? Blood pressure elevated without history of HTN 09/11/2014  ?  Erectile dysfunction 09/11/2014  ? Lump in testis 09/11/2014  ? 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 Cancer of testis, seminoma ([place]) 12/01/2013  ? Benign prostatic hyperplasia with urinary obstruction 11/19/2013  ? ? ?No Known Allergies ? ?Past Surgical History:  ?Procedure Laterality Date  ? HERNIA REPAIR    ? RADICAL  ORCHIECTOMY    ? ? ?Social History  ? ?Tobacco Use  ? Smoking status: Former  ? Smokeless tobacco: Never  ?Substance Use Topics  ? Alcohol use: No  ?  Alcohol/week: 0.0 standard drinks  ? Drug use: No  ? ? ? ?Medication list has been reviewed and updated. ? ?Current Meds  ?Medication Sig  ? allopurinol (ZYLOPRIM) 100 MG tablet Take 200 mg by mouth daily. ortho  ? aspirin 81 MG tablet Take 1 tablet by mouth daily.  ? hydrochlorothiazide (MICROZIDE) 12.5 MG capsule TAKE 1 CAPSULE BY MOUTH EVERY DAY  ? ibuprofen (ADVIL,MOTRIN) 200 MG tablet Take 200 mg by mouth as needed.  ? lisinopril (ZESTRIL) 20 MG tablet Take 1 tablet (20 mg total) by mouth daily.  ? loratadine (CLARITIN) 10 MG tablet TAKE 1 TABLET BY MOUTH EVERY DAY  ? metFORMIN (GLUCOPHAGE-XR) 750 MG 24 hr tablet Take 1 tablet by mouth 2 (two) times daily.  ? metoprolol tartrate (LOPRESSOR) 50 MG tablet Take 1 tablet (50 mg total) by mouth 2 (two) times daily.  ? Multiple Vitamins-Minerals (CENTRUM SILVER ADULT 50+ PO) Take 1 tablet by mouth daily.  ? ONETOUCH ULTRA test strip USE 1 STRIP TO TEST DAILY E11.9  ? pregabalin (LYRICA) 100 MG capsule Take by mouth.  ? rosuvastatin (CRESTOR) 5 MG tablet Take by mouth.  ? sildenafil (REVATIO) 20 MG tablet One tablet as needed before sexual activity  ? traMADol (ULTRAM) 50 MG tablet Take 1 tablet (50 mg total) by mouth every 12 (twelve) hours as needed for moderate pain.  ? valACYclovir (VALTREX) 1000 MG tablet Take 2 tablets (2,000 mg total) by mouth daily. 2 tablets today and 2 tomorrow.  ? Vitamin D, Ergocalciferol, (DRISDOL) 1.25 MG (50000 UNIT) CAPS capsule Take 1 capsule (50,000 Units total) by mouth every 7 (seven) days. Take for 8 total doses(weeks)  ? ? ? ?  07/02/2021  ?  1:55 PM 02/20/2020  ? 10:27 AM 01/05/2020  ?  1:55 PM 07/01/2019  ?  9:00 AM  ?GAD 7 : Generalized Anxiety Score  ?Nervous, Anxious, on Edge 0 0 0 0  ?Control/stop worrying 0 0 0 0  ?Worry too much - different things 0 0 0 0  ?Trouble relaxing 0  0 0 0  ?Restless 0 0 0 0  ?Easily annoyed or irritable 0 0 0 0  ?Afraid - awful might happen 0 0 0 0  ?Total GAD 7 Score 0 0 0 0  ?Anxiety Difficulty Not difficult at all     ? ? ? ?  07/02/2021  ?  1:54 PM  ?Depression screen PHQ 2/9  ?Decreased Interest 0  ?Down, Depressed, Hopeless 0  ?PHQ - 2 Score 0  ?Altered sleeping 1  ?Tired, decreased energy 1  ?Change in appetite 0  ?Feeling bad or failure about yourself  0  ?Trouble concentrating 0  ?Moving slowly or fidgety/restless 0  ?Suicidal thoughts 0  ?PHQ-9 Score 2  ?Difficult doing work/chores Not difficult at all  ? ? ?BP Readings from Last 3 Encounters:  ?07/24/21 124/76  ?07/02/21 128/86  ?12/27/20 132/72  ? ? ?Physical Exam ?Vitals and nursing  note reviewed.  ?HENT:  ?   Head: Normocephalic.  ?   Right Ear: Tympanic membrane and external ear normal. There is no impacted cerumen.  ?   Left Ear: Tympanic membrane and external ear normal. There is no impacted cerumen.  ?   Nose: Nose normal. No congestion or rhinorrhea.  ?   Mouth/Throat:  ?   Mouth: Mucous membranes are moist.  ?Eyes:  ?   General: No scleral icterus.    ?   Right eye: No discharge.     ?   Left eye: No discharge.  ?   Conjunctiva/sclera: Conjunctivae normal.  ?   Pupils: Pupils are equal, round, and reactive to light.  ?Neck:  ?   Thyroid: No thyromegaly.  ?   Vascular: No JVD.  ?   Trachea: No tracheal deviation.  ?Cardiovascular:  ?   Rate and Rhythm: Normal rate and regular rhythm.  ?   Heart sounds: Normal heart sounds. No murmur heard. ?  No friction rub. No gallop.  ?Pulmonary:  ?   Effort: No respiratory distress.  ?   Breath sounds: Normal breath sounds. No wheezing, rhonchi or rales.  ?Abdominal:  ?   General: Bowel sounds are normal.  ?   Palpations: Abdomen is soft. There is no mass.  ?   Tenderness: There is no abdominal tenderness. There is no guarding or rebound.  ?Musculoskeletal:     ?   General: No tenderness. Normal range of motion.  ?   Cervical back: Normal range of motion  and neck supple.  ?Lymphadenopathy:  ?   Cervical: No cervical adenopathy.  ?Skin: ?   General: Skin is warm.  ?   Findings: No rash.  ?Neurological:  ?   Mental Status: He is alert and oriented to person, pla

## 2021-07-24 NOTE — Addendum Note (Signed)
Addended by: Fredderick Severance on: 07/24/2021 08:46 AM ? ? Modules accepted: Orders ? ?

## 2021-07-25 LAB — LIPID PANEL WITH LDL/HDL RATIO
Cholesterol, Total: 110 mg/dL (ref 100–199)
HDL: 42 mg/dL (ref >39)
LDL Chol Calc (NIH): 55 mg/dL (ref 0–99)
LDL/HDL Ratio: 1.3 ratio (ref 0.0–3.6)
Triglycerides: 58 mg/dL (ref 0–149)
VLDL Cholesterol Cal: 13 mg/dL (ref 5–40)

## 2021-08-06 ENCOUNTER — Ambulatory Visit
Admission: RE | Admit: 2021-08-06 | Discharge: 2021-08-06 | Disposition: A | Discharge status: Home / Self Care | Payer: BC Managed Care – PPO | Source: Ambulatory Visit | Attending: Family Medicine | Admitting: Family Medicine

## 2021-08-06 ENCOUNTER — Ambulatory Visit
Admission: RE | Admit: 2021-08-06 | Discharge: 2021-08-06 | Disposition: A | Discharge status: Home / Self Care | Payer: BC Managed Care – PPO | Attending: Family Medicine | Admitting: Family Medicine

## 2021-08-06 ENCOUNTER — Encounter: Payer: Self-pay | Admitting: Family Medicine

## 2021-08-06 ENCOUNTER — Ambulatory Visit (INDEPENDENT_AMBULATORY_CARE_PROVIDER_SITE_OTHER): Payer: BC Managed Care – PPO | Admitting: Family Medicine

## 2021-08-06 VITALS — BP 128/60 | HR 78 | Ht 70.0 in | Wt 217.4 lb

## 2021-08-06 DIAGNOSIS — S92511A Displaced fracture of proximal phalanx of right lesser toe(s), initial encounter for closed fracture: Secondary | ICD-10-CM

## 2021-08-06 DIAGNOSIS — T451X5A Adverse effect of antineoplastic and immunosuppressive drugs, initial encounter: Secondary | ICD-10-CM | POA: Diagnosis not present

## 2021-08-06 DIAGNOSIS — G620 Drug-induced polyneuropathy: Secondary | ICD-10-CM | POA: Diagnosis not present

## 2021-08-06 DIAGNOSIS — S92511D Displaced fracture of proximal phalanx of right lesser toe(s), subsequent encounter for fracture with routine healing: Secondary | ICD-10-CM | POA: Diagnosis not present

## 2021-08-06 DIAGNOSIS — Z6830 Body mass index (BMI) 30.0-30.9, adult: Secondary | ICD-10-CM | POA: Diagnosis not present

## 2021-08-06 DIAGNOSIS — S92514A Nondisplaced fracture of proximal phalanx of right lesser toe(s), initial encounter for closed fracture: Secondary | ICD-10-CM | POA: Diagnosis not present

## 2021-08-06 NOTE — Patient Instructions (Signed)
-   Discontinue use of postop/fracture shoe while at home, use toe symptoms as a guide for gentle activity progression ?- While outside of the home can transition to a sturdy shoe (hiking boot, etc.), can reserve usage of previous postop/fracture shoe for any lingering pain ?- Return for follow-up in 6 weeks, obtain new x-ray prior to visit for review ?- Contact us for any questions/concerns between now and then ?

## 2021-08-06 NOTE — Progress Notes (Signed)
?  ? ?  Primary Care / Sports Medicine Office Visit ? ?Patient Information:  ?Patient ID: Jerry Harmon, male DOB: 1955-05-10 Age: 66 y.o. MRN: [id number]  ? ?Jerry Harmon is a pleasant 66 y.o. male presenting with the following: ? ?Chief Complaint  ?Patient presents with  ? Fracture toe right foot  ?  Pt thinks it is getting better.   ? ? ?Vitals:  ? 08/06/21 1342  ?BP: 128/60  ?Pulse: 78  ?SpO2: 98%  ? ?Vitals:  ? 08/06/21 1342  ?Weight: 217 lb 6.4 oz (98.6 kg)  ?Height: '5\' 10"'$  (1.778 m)  ? ?Body mass index is 31.19 kg/m?. ? ?No results found.  ? ?Independent interpretation of notes and tests performed by another provider:  ? ?Independent interpretation of right foot x-rays dated 08/06/2021 reveals interval prominent sclerosis and callus formation without interval displacement at the fourth proximal phalanx consistent with healing. ? ?Procedures performed:  ? ?None ? ?Pertinent History, Exam, Impression, and Recommendations:  ? ?Problem List Items Addressed This Visit   ? ?  ? Musculoskeletal and Integument  ? Closed displaced fracture of proximal phalanx of lesser toe of right foot - Primary  ?  Patient reports excellent interval improvement in regards to pain and swelling, has been compliant with fracture shoe and Rx vitamin D, daily calcium.  He obtained new x-rays today which reveal significant interval callus formation, his examination reveals nontender fourth proximal phalanx to palpation and percussion, range of motion is full, sensorimotor intact.  At this stage have advised him to discontinue postop shoe while at home, gradually wean and discontinue while outside of the home, work towards regular activity as tolerated, and return in 6 weeks with repeat radiographs for review. ? ?  ?  ? Relevant Orders  ? DG Foot Complete Right  ?  ? ?Orders & Medications ?No orders of the defined types were placed in this encounter. ? ?Orders Placed This Encounter  ?Procedures  ? DG Foot Complete Right  ?  ? ?Return in about  6 weeks (around 09/17/2021).  ?  ? ?Montel Culver, MD ? ? Primary Care Sports Medicine ?[facility] ?[place]  ? ?

## 2021-08-06 NOTE — Assessment & Plan Note (Signed)
Patient reports excellent interval improvement in regards to pain and swelling, has been compliant with fracture shoe and Rx vitamin D, daily calcium.  He obtained new x-rays today which reveal significant interval callus formation, his examination reveals nontender fourth proximal phalanx to palpation and percussion, range of motion is full, sensorimotor intact.  At this stage have advised him to discontinue postop shoe while at home, gradually wean and discontinue while outside of the home, work towards regular activity as tolerated, and return in 6 weeks with repeat radiographs for review. ?

## 2021-08-19 DIAGNOSIS — G4733 Obstructive sleep apnea (adult) (pediatric): Secondary | ICD-10-CM | POA: Diagnosis not present

## 2021-09-03 DIAGNOSIS — L280 Lichen simplex chronicus: Secondary | ICD-10-CM | POA: Diagnosis not present

## 2021-09-03 DIAGNOSIS — Z859 Personal history of malignant neoplasm, unspecified: Secondary | ICD-10-CM | POA: Diagnosis not present

## 2021-09-03 DIAGNOSIS — L578 Other skin changes due to chronic exposure to nonionizing radiation: Secondary | ICD-10-CM | POA: Diagnosis not present

## 2021-09-06 ENCOUNTER — Other Ambulatory Visit: Payer: Self-pay | Admitting: Family Medicine

## 2021-09-06 DIAGNOSIS — N528 Other male erectile dysfunction: Secondary | ICD-10-CM

## 2021-09-17 ENCOUNTER — Ambulatory Visit: Payer: BC Managed Care – PPO | Admitting: Family Medicine

## 2021-09-19 DIAGNOSIS — G4733 Obstructive sleep apnea (adult) (pediatric): Secondary | ICD-10-CM | POA: Diagnosis not present

## 2021-09-30 DIAGNOSIS — I152 Hypertension secondary to endocrine disorders: Secondary | ICD-10-CM | POA: Diagnosis not present

## 2021-09-30 DIAGNOSIS — E785 Hyperlipidemia, unspecified: Secondary | ICD-10-CM | POA: Diagnosis not present

## 2021-09-30 DIAGNOSIS — E1169 Type 2 diabetes mellitus with other specified complication: Secondary | ICD-10-CM | POA: Diagnosis not present

## 2021-09-30 DIAGNOSIS — E1159 Type 2 diabetes mellitus with other circulatory complications: Secondary | ICD-10-CM | POA: Diagnosis not present

## 2021-10-01 ENCOUNTER — Other Ambulatory Visit: Payer: Self-pay | Admitting: Family Medicine

## 2021-10-01 DIAGNOSIS — N528 Other male erectile dysfunction: Secondary | ICD-10-CM

## 2021-10-19 DIAGNOSIS — G4733 Obstructive sleep apnea (adult) (pediatric): Secondary | ICD-10-CM | POA: Diagnosis not present

## 2021-10-23 ENCOUNTER — Ambulatory Visit (INDEPENDENT_AMBULATORY_CARE_PROVIDER_SITE_OTHER): Payer: BC Managed Care – PPO

## 2021-10-23 DIAGNOSIS — Z23 Encounter for immunization: Secondary | ICD-10-CM | POA: Diagnosis not present

## 2021-11-03 ENCOUNTER — Other Ambulatory Visit: Payer: Self-pay | Admitting: Family Medicine

## 2021-11-03 DIAGNOSIS — N528 Other male erectile dysfunction: Secondary | ICD-10-CM

## 2021-11-05 ENCOUNTER — Other Ambulatory Visit: Payer: Self-pay | Admitting: Family Medicine

## 2021-11-08 ENCOUNTER — Other Ambulatory Visit: Payer: Self-pay

## 2021-11-08 ENCOUNTER — Telehealth: Payer: Self-pay | Admitting: Family Medicine

## 2021-11-08 ENCOUNTER — Other Ambulatory Visit: Payer: Self-pay | Admitting: Family Medicine

## 2021-11-08 DIAGNOSIS — E119 Type 2 diabetes mellitus without complications: Secondary | ICD-10-CM

## 2021-11-08 MED ORDER — ONETOUCH ULTRASOFT LANCETS MISC: 0 refills | Status: DC

## 2021-11-08 NOTE — Telephone Encounter (Unsigned)
Copied from [place] [id number]. Topic: General - Other >> Nov 08, 2021  2:37 PM Everette C wrote: Reason for CRM: Medication Refill - Medication: one touch ultra lancets   Has the patient contacted their pharmacy? Yes.  The patient was directed to contact their PCP (Agent: If no, request that the patient contact the pharmacy for the refill. If patient does not wish to contact the pharmacy document the reason why and proceed with request.) (Agent: If yes, when and what did the pharmacy advise?)  Preferred Pharmacy (with phone number or street name): [id number] [place] [id number]: [phone]: [phone]: Not open 24 hours   Has the patient been seen for an appointment in the last year OR does the patient have an upcoming appointment? Yes.  Agent: Please be advised that RX refills may take up to 3 business days. We ask that you follow-up with your pharmacy.

## 2021-11-08 NOTE — Telephone Encounter (Signed)
Attempted to call patient to verify lancets are needed- do not see this on current list or history. No answer at number- left message would request new Rx for lancets- please contact office if this is not what is needed.

## 2021-11-08 NOTE — Telephone Encounter (Signed)
Requested medications are due for refill today.  Unsure  Requested medications are on the active medications list.  no  Last refill. none  Future visit scheduled.   no  Notes to clinic.  Not on med list. Was refused by clinic and forwarded to Endo for review.    Requested Prescriptions  Pending Prescriptions Disp Refills   OneTouch Delica Lancets [address] [Pharmacy Med Name: ONE TOUCH DELICA 62X LANCETS] 528 each     Sig: USE TO TEST DAILY E11.9     Endocrinology: Diabetes - Testing Supplies Passed - 11/08/2021  2:35 PM      Passed - Valid encounter within last 12 months    Recent Outpatient Visits           3 months ago Closed displaced fracture of proximal phalanx of lesser toe of right foot with routine healing, subsequent encounter   [facility] Montel Culver, MD   3 months ago Essential hypertension   [facility] Juline Patch, MD   4 months ago Closed displaced fracture of proximal phalanx of lesser toe of right foot, initial encounter   [facility] Montel Culver, MD   10 months ago Essential hypertension   [facility] Juline Patch, MD   1 year ago Essential hypertension   [facility] Juline Patch, MD

## 2021-11-17 DIAGNOSIS — G4733 Obstructive sleep apnea (adult) (pediatric): Secondary | ICD-10-CM | POA: Diagnosis not present

## 2021-11-21 DIAGNOSIS — G4733 Obstructive sleep apnea (adult) (pediatric): Secondary | ICD-10-CM | POA: Diagnosis not present

## 2021-12-03 ENCOUNTER — Telehealth: Payer: Self-pay | Admitting: Family Medicine

## 2021-12-03 ENCOUNTER — Other Ambulatory Visit: Payer: Self-pay | Admitting: Family Medicine

## 2021-12-03 DIAGNOSIS — N528 Other male erectile dysfunction: Secondary | ICD-10-CM

## 2021-12-03 NOTE — Telephone Encounter (Signed)
-----   Message from Jerry Harmon sent at 12/03/2021 12:33 PM EDT ----- Please call and set up appt for meds in Sept

## 2021-12-03 NOTE — Telephone Encounter (Signed)
Mailbox is full could not lvm to set up medication refill appointment.

## 2021-12-18 DIAGNOSIS — G4733 Obstructive sleep apnea (adult) (pediatric): Secondary | ICD-10-CM | POA: Diagnosis not present

## 2021-12-19 ENCOUNTER — Other Ambulatory Visit: Payer: Self-pay | Admitting: Family Medicine

## 2021-12-19 DIAGNOSIS — J301 Allergic rhinitis due to pollen: Secondary | ICD-10-CM

## 2022-01-17 DIAGNOSIS — G4733 Obstructive sleep apnea (adult) (pediatric): Secondary | ICD-10-CM | POA: Diagnosis not present

## 2022-01-22 ENCOUNTER — Other Ambulatory Visit: Payer: Self-pay | Admitting: Family Medicine

## 2022-01-22 DIAGNOSIS — I10 Essential (primary) hypertension: Secondary | ICD-10-CM

## 2022-01-22 DIAGNOSIS — T451X5A Adverse effect of antineoplastic and immunosuppressive drugs, initial encounter: Secondary | ICD-10-CM

## 2022-01-22 NOTE — Telephone Encounter (Signed)
Requested medication (s) are due for refill today: yes  Requested medication (s) are on the active medication list: yes    Last refill: 07/24/21  #90 1 refill  Future visit scheduled no  Notes to clinic:Failed due to labs, please review. Thank you.  Requested Prescriptions  Pending Prescriptions Disp Refills   hydrochlorothiazide (MICROZIDE) 12.5 MG capsule [Pharmacy Med Name: HYDROCHLOROTHIAZIDE 12.5 MG CP] 90 capsule 1    Sig: TAKE 1 CAPSULE BY MOUTH EVERY DAY     Cardiovascular: Diuretics - Thiazide Failed - 01/22/2022  1:49 AM      Failed - Cr in normal range and within 180 days    Creatinine, Ser  Date Value Ref Range Status  12/27/2020 1.53 (H) 0.76 - 1.27 mg/dL Final         Failed - K in normal range and within 180 days    Potassium  Date Value Ref Range Status  12/27/2020 4.4 3.5 - 5.2 mmol/L Final         Failed - Na in normal range and within 180 days    Sodium  Date Value Ref Range Status  12/27/2020 141 134 - 144 mmol/L Final         Passed - Last BP in normal range    BP Readings from Last 1 Encounters:  08/06/21 128/60         Passed - Valid encounter within last 6 months    Recent Outpatient Visits           5 months ago Closed displaced fracture of proximal phalanx of lesser toe of right foot with routine healing, subsequent encounter   [place] [facility] [place], Earley Abide, MD   6 months ago Essential hypertension   [place] [facility], MD   6 months ago Closed displaced fracture of proximal phalanx of lesser toe of right foot, initial encounter   [place] [facility] [place], Earley Abide, MD   1 year ago Essential hypertension   [place] [facility] [place], Deanna C, MD   1 year ago Essential hypertension   [place] [facility] at  [place], Deanna C, MD

## 2022-01-23 NOTE — Telephone Encounter (Signed)
Requested medication (s) are due for refill today:   Provider to review  Requested medication (s) are on the active medication list:   Yes  Future visit scheduled:   No   Last ordered: 07/24/2021 #60, 5 refills  Non delegated refill reason returned   Requested Prescriptions  Pending Prescriptions Disp Refills   pregabalin (LYRICA) 100 MG capsule [Pharmacy Med Name: PREGABALIN 100 MG CAPSULE] 60 capsule     Sig: TAKE 1 CAPSULE BY MOUTH TWICE A DAY     Not Delegated - Neurology:  Anticonvulsants - Controlled - pregabalin Failed - 01/22/2022  5:54 PM      Failed - This refill cannot be delegated      Failed - Cr in normal range and within 360 days    Creatinine, Ser  Date Value Ref Range Status  12/27/2020 1.53 (H) 0.76 - 1.27 mg/dL Final         Passed - Completed PHQ-2 or PHQ-9 in the last 360 days      Passed - Valid encounter within last 12 months    Recent Outpatient Visits           5 months ago Closed displaced fracture of proximal phalanx of lesser toe of right foot with routine healing, subsequent encounter   [place] [facility] [place], Earley Abide, MD   6 months ago Essential hypertension   [place] [facility], MD   6 months ago Closed displaced fracture of proximal phalanx of lesser toe of right foot, initial encounter   [place] [facility] at [place], Earley Abide, MD   1 year ago Essential hypertension   [place] [facility] at [place], Deanna C, MD   1 year ago Essential hypertension   [place] [facility] [place], Deanna C, MD

## 2022-01-24 NOTE — Telephone Encounter (Signed)
Pt has an appt on 01/27/22.  KP

## 2022-01-27 ENCOUNTER — Ambulatory Visit (INDEPENDENT_AMBULATORY_CARE_PROVIDER_SITE_OTHER): Payer: BC Managed Care – PPO | Admitting: Family Medicine

## 2022-01-27 ENCOUNTER — Encounter: Payer: Self-pay | Admitting: Family Medicine

## 2022-01-27 VITALS — BP 120/76 | HR 83 | Ht 70.0 in | Wt 218.0 lb

## 2022-01-27 DIAGNOSIS — E119 Type 2 diabetes mellitus without complications: Secondary | ICD-10-CM

## 2022-01-27 DIAGNOSIS — B001 Herpesviral vesicular dermatitis: Secondary | ICD-10-CM

## 2022-01-27 DIAGNOSIS — Z23 Encounter for immunization: Secondary | ICD-10-CM

## 2022-01-27 DIAGNOSIS — G620 Drug-induced polyneuropathy: Secondary | ICD-10-CM

## 2022-01-27 DIAGNOSIS — T451X5A Adverse effect of antineoplastic and immunosuppressive drugs, initial encounter: Secondary | ICD-10-CM

## 2022-01-27 DIAGNOSIS — N528 Other male erectile dysfunction: Secondary | ICD-10-CM

## 2022-01-27 DIAGNOSIS — I10 Essential (primary) hypertension: Secondary | ICD-10-CM

## 2022-01-27 DIAGNOSIS — J301 Allergic rhinitis due to pollen: Secondary | ICD-10-CM | POA: Diagnosis not present

## 2022-01-27 MED ORDER — LORATADINE 10 MG PO TABS: 10.0000 mg | ORAL_TABLET | Freq: Every day | ORAL | 1 refills | Status: DC

## 2022-01-27 MED ORDER — SILDENAFIL CITRATE 20 MG PO TABS: ORAL_TABLET | ORAL | 5 refills | Status: DC

## 2022-01-27 MED ORDER — LISINOPRIL 20 MG PO TABS: 20.0000 mg | ORAL_TABLET | Freq: Every day | ORAL | 1 refills | Status: DC

## 2022-01-27 MED ORDER — VALACYCLOVIR HCL 1 G PO TABS: 2000.0000 mg | ORAL_TABLET | Freq: Every day | ORAL | 1 refills | Status: DC

## 2022-01-27 MED ORDER — METOPROLOL TARTRATE 50 MG PO TABS
50.0000 mg | ORAL_TABLET | Freq: Two times a day (BID) | ORAL | 1 refills | Status: DC
Start: 2022-01-27 — End: 2022-07-29

## 2022-01-27 MED ORDER — HYDROCHLOROTHIAZIDE 12.5 MG PO CAPS: 12.5000 mg | ORAL_CAPSULE | Freq: Every day | ORAL | 1 refills | Status: DC

## 2022-01-27 MED ORDER — PREGABALIN 100 MG PO CAPS
100.0000 mg | ORAL_CAPSULE | Freq: Two times a day (BID) | ORAL | 5 refills | Status: DC
Start: 2022-01-27 — End: 2022-07-29

## 2022-01-27 NOTE — Progress Notes (Signed)
Date:  01/27/2022   Name:  Jerry Harmon   DOB:  1955/07/15   MRN:  [id number]   Chief Complaint: fever blisters, Erectile Dysfunction, Peripheral Neuropathy, Hypertension, and Allergic Rhinitis   Erectile Dysfunction This is a chronic problem. The current episode started more than 1 year ago. The problem has been gradually improving since onset. The nature of his difficulty is achieving erection. Nothing aggravates the symptoms. Past treatments include sildenafil. The treatment provided moderate relief.  Hypertension This is a chronic problem. The current episode started more than 1 year ago. The problem has been gradually improving since onset. The problem is controlled. Pertinent negatives include no blurred vision, chest pain, orthopnea, palpitations or shortness of breath. There are no associated agents to hypertension. Past treatments include ACE inhibitors, diuretics and beta blockers. The current treatment provides moderate improvement. There are no compliance problems.   Neurologic Problem The patient's pertinent negatives include no memory loss, slurred speech or visual change. Primary symptoms comment: for peripheral neuropathy. The problem is unchanged (stable). Pertinent negatives include no chest pain, palpitations or shortness of breath. The treatment provided moderate relief.  URI  This is a recurrent problem. The problem has been waxing and waning. There has been no fever. Pertinent negatives include no chest pain, congestion, rhinorrhea or sneezing.    Lab Results  Component Value Date   NA 141 12/27/2020   K 4.4 12/27/2020   CO2 25 12/27/2020   GLUCOSE 143 (H) 12/27/2020   BUN 31 (H) 12/27/2020   CREATININE 1.53 (H) 12/27/2020   CALCIUM 9.7 12/27/2020   EGFR 51 (L) 12/27/2020   GFRNONAA 68 01/06/2020   Lab Results  Component Value Date   CHOL 110 07/24/2021   HDL 42 07/24/2021   LDLCALC 55 07/24/2021   TRIG 58 07/24/2021   CHOLHDL 3.8 01/12/2019   Lab  Results  Component Value Date   TSH 2.600 03/12/2015   Lab Results  Component Value Date   HGBA1C 7.0 (H) 12/27/2020   Lab Results  Component Value Date   WBC 5.8 05/05/2019   HGB 13.3 (A) 05/05/2019   HCT 39 (A) 05/05/2019   PLT 191 05/05/2019   Lab Results  Component Value Date   ALT 16 12/27/2020   AST 14 12/27/2020   ALKPHOS 65 12/27/2020   BILITOT 0.4 12/27/2020   No results found for: "25OHVITD2", "25OHVITD3", "VD25OH"   Review of Systems  HENT:  Negative for congestion, rhinorrhea and sneezing.   Eyes:  Negative for blurred vision.  Respiratory:  Negative for shortness of breath.   Cardiovascular:  Negative for chest pain, palpitations and orthopnea.  Psychiatric/Behavioral:  Negative for memory loss.     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  Current Meds  Medication Sig   allopurinol (ZYLOPRIM) 100 MG tablet Take 200 mg by mouth daily. ortho    aspirin 81 MG tablet Take 1 tablet by mouth daily.   hydrochlorothiazide (MICROZIDE) 12.5 MG capsule TAKE 1 CAPSULE BY MOUTH EVERY DAY   ibuprofen (ADVIL,MOTRIN) 200 MG tablet Take 200 mg by mouth as needed.   Lancets (ONETOUCH ULTRASOFT) lancets One test daily   lisinopril (ZESTRIL) 20 MG tablet Take 1 tablet (20 mg total) by mouth daily.   loratadine (CLARITIN) 10 MG tablet TAKE 1 TABLET BY MOUTH EVERY DAY   metFORMIN (GLUCOPHAGE-XR) 750 MG 24 hr tablet Take 1 tablet by mouth 2 (two) times daily.   metoprolol tartrate (LOPRESSOR) 50 MG tablet Take 1 tablet (50 mg total) by mouth 2 (two) times daily.   Multiple Vitamins-Minerals (CENTRUM SILVER ADULT 50+ PO) Take 1 tablet by mouth daily.   ONETOUCH ULTRA test strip USE 1 STRIP TO TEST DAILY E11.9   pregabalin (LYRICA) 100 MG capsule Take 1 capsule (100 mg total) by mouth 2 (two) times daily.   rosuvastatin (CRESTOR) 5 MG tablet Take by mouth.   sildenafil (REVATIO) 20 MG tablet TAKE 1 TABLET BY MOUTH AS NEEDED BEFORE SEXUAL ACTIVITY   traMADol (ULTRAM) 50 MG tablet Take 1 tablet (50 mg total) by mouth every 12 (twelve) hours as needed for moderate pain.   valACYclovir (VALTREX) 1000 MG tablet Take 2 tablets (2,000 mg total) by mouth daily. 2 tablets today and 2 tomorrow.       01/27/2022    3:31 PM 08/06/2021    1:45 PM 07/02/2021    1:55 PM 02/20/2020   10:27 AM  GAD 7 : Generalized Anxiety Score  Nervous, Anxious, on Edge 0 0 0 0  Control/stop worrying 0 0 0 0  Worry too much - different things 0 0 0 0  Trouble relaxing 0 0 0 0  Restless 0 0 0 0  Easily annoyed or irritable 0 0 0 0  Afraid - awful might happen 0 0 0 0  Total GAD 7 Score 0 0 0 0  Anxiety Difficulty Not difficult at all Not difficult at all Not difficult at all        01/27/2022    3:31 PM 08/06/2021    1:45 PM 07/02/2021    1:54 PM  Depression screen PHQ 2/9  Decreased Interest 0 0 0  Down, Depressed, Hopeless 0 0 0  PHQ - 2 Score 0 0 0  Altered sleeping 0 1 1   Tired, decreased energy 0 1 1  Change in appetite 0 0 0  Feeling bad or failure about yourself  0 0 0  Trouble concentrating 0 0 0  Moving slowly or fidgety/restless 0 0 0  Suicidal thoughts 0 0 0  PHQ-9 Score 0 2 2  Difficult doing work/chores Not difficult at all  Not difficult at all    BP Readings from Last 3 Encounters:  01/27/22 120/76  08/06/21 128/60  07/24/21 124/76    Physical Exam Vitals and nursing note reviewed.  HENT:     Head: Normocephalic.     Right Ear: External ear  normal.     Left Ear: External ear normal.     Nose: Nose normal.  Eyes:     General: No scleral icterus.       Right eye: No discharge.        Left eye: No discharge.     Conjunctiva/sclera: Conjunctivae normal.     Pupils: Pupils are equal, round, and reactive to light.  Neck:     Thyroid: No thyromegaly.     Vascular: No JVD.     Trachea: No tracheal deviation.  Cardiovascular:     Rate and Rhythm: Normal rate and regular rhythm.     Heart sounds: Normal heart sounds. No murmur heard.    No friction rub. No gallop.  Pulmonary:     Effort: No respiratory distress.     Breath sounds: Normal breath sounds. No wheezing, rhonchi or rales.  Abdominal:     General: Bowel sounds are normal.     Palpations: Abdomen is soft. There is no mass.     Tenderness: There is no abdominal tenderness. There is no guarding or rebound.  Musculoskeletal:        General: No tenderness. Normal range of motion.     Cervical back: Normal range of motion and neck supple.  Lymphadenopathy:     Cervical: No cervical adenopathy.  Skin:    General: Skin is warm.     Findings: No rash.  Neurological:     Mental Status: He is alert and oriented to person, place, and time.     Cranial Nerves: No cranial nerve deficit.     Deep Tendon Reflexes: Reflexes are normal and symmetric.     Wt Readings from Last 3 Encounters:  01/27/22 218 lb (98.9 kg)  08/06/21 217 lb 6.4 oz (98.6 kg)  07/24/21 219 lb (99.3 kg)     BP 120/76   Pulse 83   Ht 5' 10"  (1.778 m)   Wt 218 lb (98.9 kg)   SpO2 93%   BMI 31.28 kg/m   Assessment and Plan:  1. Essential hypertension Chronic.  Controlled.  Stable.  Blood pressure today 120/76.  Asymptomatic.  Tolerating medications well.  Continue hydrochlorothiazide 12.5 mg once a day, lisinopril 20 mg once a day, and metoprolol 50 mg 1 twice a day.  We will check renal function panel for electrolytes and GFR. - hydrochlorothiazide (MICROZIDE) 12.5 MG capsule; Take 1 capsule (12.5 mg total) by mouth daily.  Dispense: 90 capsule; Refill: 1 - lisinopril (ZESTRIL) 20 MG tablet; Take 1 tablet (20 mg total) by mouth daily.  Dispense: 90 tablet; Refill: 1 - metoprolol tartrate (LOPRESSOR) 50 MG tablet; Take 1 tablet (50 mg total) by mouth 2 (two) times daily.  Dispense: 180 tablet; Refill: 1 - Renal Function Panel  2. Seasonal allergic rhinitis due to pollen Chronic.  Episodic.  Stable.  Continue loratadine 10 mg once a day. - loratadine (CLARITIN) 10 MG tablet; Take 1 tablet (10 mg total) by mouth daily.  Dispense: 90 tablet; Refill: 1  3. Peripheral neuropathy due to chemotherapy (HCC) Chronic.  Controlled.  Stable.  It is currently held and checked with Lyrica 100 mg twice a day.  This etiology of peripheral neuropathy is secondary to cisplatin him/chemotherapy in the past. - pregabalin (LYRICA) 100 MG capsule; Take 1 capsule (100 mg total) by mouth 2 (two) times daily.  Dispense: 60 capsule; Refill: 5  4. Other male erectile dysfunction .  Episodic.  Stable.  Continue sildenafil 20 mg as  needed. - sildenafil (REVATIO) 20 MG tablet; TAKE 1 TABLET BY MOUTH AS NEEDED BEFORE SEXUAL ACTIVITY  Dispense: 30 tablet; Refill: 5  5. Fever blister Chronic.  Episodic.  Stable.  Continue valacyclovir 1 g 2 tablets today followed by 2 in the next day. - valACYclovir (VALTREX) 1000 MG tablet; Take 2 tablets (2,000 mg total) by mouth daily. 2 tablets today and 2 tomorrow.  Dispense: 10  tablet; Refill: 1  6. Need for immunization against influenza Discussed and administered. - Flu Vaccine QUAD 83moIM (Fluarix, Fluzone & Alfiuria Quad PF)  7. New onset type 2 diabetes mellitus ([place] followed by endocrinology and will obtain microalbumin. - Microalbumin / creatinine urine ratio    DOtilio Miu MD

## 2022-01-29 LAB — RENAL FUNCTION PANEL
Albumin: 4.9 g/dL (ref 3.9–4.9)
BUN/Creatinine Ratio: 23 (ref 10–24)
BUN: 27 mg/dL (ref 8–27)
CO2: 22 mmol/L (ref 20–29)
Calcium: 9.8 mg/dL (ref 8.6–10.2)
Chloride: 101 mmol/L (ref 96–106)
Creatinine, Ser: 1.15 mg/dL (ref 0.76–1.27)
Glucose: 118 mg/dL — ABNORMAL HIGH (ref 70–99)
Phosphorus: 4.1 mg/dL (ref 2.8–4.1)
Potassium: 4.5 mmol/L (ref 3.5–5.2)
Sodium: 142 mmol/L (ref 134–144)
eGFR: 71 mL/min/{1.73_m2} (ref >59)

## 2022-01-29 LAB — MICROALBUMIN / CREATININE URINE RATIO
Creatinine, Urine: 43.5 mg/dL
Microalb/Creat Ratio: <7 mg/g creat (ref 0–29)
Microalbumin, Urine: <3 ug/mL

## 2022-01-30 NOTE — Progress Notes (Signed)
PC to pt LVM to call back for lab results. CRM created PEC may give results

## 2022-01-31 ENCOUNTER — Telehealth: Payer: Self-pay

## 2022-01-31 NOTE — Telephone Encounter (Signed)
Pt returned our call. Shared provider's note. No further questions.  Clista Bernhardt, [place]  01/31/2022  9:57 AM EDT Back to Top    Left Vm for patient informing of acceptable labs.    - [place], [place]  01/30/2022  9:47 AM EDT     PC to pt LVM to call back for lab results. CRM created PEC may give results   Juline Patch, MD  01/30/2022  7:09 AM EDT     Acceptable

## 2022-02-15 DIAGNOSIS — G4733 Obstructive sleep apnea (adult) (pediatric): Secondary | ICD-10-CM | POA: Diagnosis not present

## 2022-02-21 DIAGNOSIS — G4733 Obstructive sleep apnea (adult) (pediatric): Secondary | ICD-10-CM | POA: Diagnosis not present

## 2022-03-03 ENCOUNTER — Ambulatory Visit: Payer: Self-pay

## 2022-03-03 NOTE — Telephone Encounter (Signed)
  Chief Complaint: groin pain  Symptoms: R groin pain 7-8/10 when shooting pain present, numbness or weakness in RLE.  Frequency: couple of months Pertinent Negatives: NA Disposition: '[]'$ ED /'[]'$ Urgent Care (no appt availability in office) / '[x]'$ Appointment(In office/virtual)/ '[]'$  [facility]/ '[]'$ Home Care/ '[]'$ Refused Recommended Disposition /'[]'$ [place] [facility]/ '[]'$  Follow-up with PCP Additional Notes: pt unsure if groin strain or not but hasn't gotten better. Pt states pain comes and goes. Was unsure if he needed to see Dr. Ronnald Ramp or Zigmund Daniel since he deals with sports medicine. Advised pt could see him since possible groin strain and scheduled appt for 03/04/22 at 1420. Pt verbalized understanding.   Reason for Disposition  [1] MODERATE pain (e.g., interferes with normal activities, limping) AND [2] present > 3 days  Answer Assessment - Initial Assessment Questions 1. ONSET: "When did the pain start?"      Couple of months 2. LOCATION: "Where is the pain located?"      R groin and RLE 3. PAIN: "How bad is the pain?"    (Scale 1-10; or mild, moderate, severe)   -  MILD (1-3): doesn't interfere with normal activities    -  MODERATE (4-7): interferes with normal activities (e.g., work or school) or awakens from sleep, limping    -  SEVERE (8-10): excruciating pain, unable to do any normal activities, unable to walk     When shoots pain 7/8, not constant  5. CAUSE: "What do you think is causing the leg pain?"     Unsure if groin strain or not  6. OTHER SYMPTOMS: "Do you have any other symptoms?" (e.g., chest pain, back pain, breathing difficulty, swelling, rash, fever, numbness, weakness)     Numbness in RLE at times  Protocols used: Leg Pain-A-AH

## 2022-03-04 ENCOUNTER — Ambulatory Visit
Admission: RE | Admit: 2022-03-04 | Discharge: 2022-03-04 | Disposition: A | Discharge status: Home / Self Care | Payer: BC Managed Care – PPO | Source: Ambulatory Visit | Attending: Family Medicine | Admitting: Family Medicine

## 2022-03-04 ENCOUNTER — Ambulatory Visit (INDEPENDENT_AMBULATORY_CARE_PROVIDER_SITE_OTHER): Payer: BC Managed Care – PPO | Admitting: Family Medicine

## 2022-03-04 ENCOUNTER — Encounter: Payer: Self-pay | Admitting: Family Medicine

## 2022-03-04 ENCOUNTER — Ambulatory Visit
Admission: RE | Admit: 2022-03-04 | Discharge: 2022-03-04 | Disposition: A | Discharge status: Home / Self Care | Payer: BC Managed Care – PPO | Attending: Family Medicine | Admitting: Family Medicine

## 2022-03-04 VITALS — BP 128/78 | HR 88 | Ht 70.0 in | Wt 213.0 lb

## 2022-03-04 DIAGNOSIS — M25551 Pain in right hip: Secondary | ICD-10-CM | POA: Diagnosis not present

## 2022-03-04 DIAGNOSIS — M1611 Unilateral primary osteoarthritis, right hip: Secondary | ICD-10-CM | POA: Insufficient documentation

## 2022-03-04 MED ORDER — CYCLOBENZAPRINE HCL 5 MG PO TABS: 5.0000 mg | ORAL_TABLET | Freq: Three times a day (TID) | ORAL | 0 refills | Status: DC | PRN

## 2022-03-04 MED ORDER — DICLOFENAC SODIUM 75 MG PO TBEC: 75.0000 mg | DELAYED_RELEASE_TABLET | Freq: Two times a day (BID) | ORAL | 0 refills | Status: DC

## 2022-03-04 NOTE — Patient Instructions (Signed)
-   Obtain x-ray today - Start diclofenac twice daily with food (no other NSAIDs such as ibuprofen while on this medication) - Can dose cyclobenzaprine 3 times a day as needed for muscle tightness pain - Perform gentle activity as tolerated using hip/leg symptoms as a guide - Return for follow-up in 2 weeks

## 2022-03-06 ENCOUNTER — Other Ambulatory Visit: Payer: Self-pay | Admitting: Family Medicine

## 2022-03-06 DIAGNOSIS — M899 Disorder of bone, unspecified: Secondary | ICD-10-CM

## 2022-03-12 NOTE — Progress Notes (Signed)
     Primary Care / Sports Medicine Office Visit  Patient Information:  Patient ID: Jerry Harmon, male DOB: 29-Nov-1955 Age: 67 y.o. MRN: [id number]   Jerry Harmon is a pleasant 66 y.o. male presenting with the following:  Chief Complaint  Patient presents with   Groin Pain    Right side, down leg, outside of leg goes numb. 6-8 weeks.     Vitals:   03/04/22 1426  BP: 128/78  Pulse: 88  SpO2: 98%   Vitals:   03/04/22 1426  Weight: 213 lb (96.6 kg)  Height: '5\' 10"'$  (1.778 m)   Body mass index is 30.56 kg/m.     Independent interpretation of notes and tests performed by another provider:   None  Procedures performed:   None  Pertinent History, Exam, Impression, and Recommendations:   Problem List Items Addressed This Visit       Other   Arthralgia of right hip - Primary    Acute on chronic condition. Patient with right groin pain with radiation down leg, associated numbness intermittently. Examination focal to the right hip joint, -SLR, cannot exclude secondary lumbosacral involvement. Medication regimen, x-rays, close follow-up.      Relevant Medications   diclofenac (VOLTAREN) 75 MG EC tablet   cyclobenzaprine (FLEXERIL) 5 MG tablet   Other Relevant Orders   DG Hip Unilat W OR W/O Pelvis 2-3 Views Right (Completed)     Orders & Medications Meds ordered this encounter  Medications   diclofenac (VOLTAREN) 75 MG EC tablet    Sig: Take 1 tablet (75 mg total) by mouth 2 (two) times daily.    Dispense:  30 tablet    Refill:  0   cyclobenzaprine (FLEXERIL) 5 MG tablet    Sig: Take 1 tablet (5 mg total) by mouth 3 (three) times daily as needed for muscle spasms.    Dispense:  30 tablet    Refill:  0   Orders Placed This Encounter  Procedures   DG Hip Unilat W OR W/O Pelvis 2-3 Views Right     Return in about 2 weeks (around 03/18/2022).     Montel Culver, MD, [facility]   Primary Care Sports Medicine Primary Care and Sports Medicine at [facility]

## 2022-03-12 NOTE — Assessment & Plan Note (Signed)
Acute on chronic condition. Patient with right groin pain with radiation down leg, associated numbness intermittently. Examination focal to the right hip joint, -SLR, cannot exclude secondary lumbosacral involvement. Medication regimen, x-rays, close follow-up.

## 2022-03-13 DIAGNOSIS — E1159 Type 2 diabetes mellitus with other circulatory complications: Secondary | ICD-10-CM | POA: Diagnosis not present

## 2022-03-13 DIAGNOSIS — I152 Hypertension secondary to endocrine disorders: Secondary | ICD-10-CM | POA: Diagnosis not present

## 2022-03-13 DIAGNOSIS — E785 Hyperlipidemia, unspecified: Secondary | ICD-10-CM | POA: Diagnosis not present

## 2022-03-13 DIAGNOSIS — E1169 Type 2 diabetes mellitus with other specified complication: Secondary | ICD-10-CM | POA: Diagnosis not present

## 2022-03-17 DIAGNOSIS — G4733 Obstructive sleep apnea (adult) (pediatric): Secondary | ICD-10-CM | POA: Diagnosis not present

## 2022-03-18 ENCOUNTER — Ambulatory Visit (INDEPENDENT_AMBULATORY_CARE_PROVIDER_SITE_OTHER): Payer: BC Managed Care – PPO | Admitting: Family Medicine

## 2022-03-18 ENCOUNTER — Encounter: Payer: Self-pay | Admitting: Family Medicine

## 2022-03-18 VITALS — BP 120/80 | HR 64 | Ht 70.0 in | Wt 213.0 lb

## 2022-03-18 DIAGNOSIS — M85652 Other cyst of bone, left thigh: Secondary | ICD-10-CM

## 2022-03-18 DIAGNOSIS — M25551 Pain in right hip: Secondary | ICD-10-CM

## 2022-03-18 DIAGNOSIS — M1611 Unilateral primary osteoarthritis, right hip: Secondary | ICD-10-CM

## 2022-03-18 MED ORDER — CYCLOBENZAPRINE HCL 5 MG PO TABS: 5.0000 mg | ORAL_TABLET | Freq: Three times a day (TID) | ORAL | 0 refills | Status: DC | PRN

## 2022-03-18 MED ORDER — DICLOFENAC SODIUM 75 MG PO TBEC: 75.0000 mg | DELAYED_RELEASE_TABLET | Freq: Two times a day (BID) | ORAL | 0 refills | Status: DC | PRN

## 2022-03-18 NOTE — Assessment & Plan Note (Signed)
Incidentally noted on routine hip x-rays, MRI was ordered, he has a scheduled for tomorrow.  We will follow results once available.

## 2022-03-18 NOTE — Patient Instructions (Signed)
-   Transition to as needed dosing of diclofenac, up to twice a day with food - Can dose cyclobenzaprine nightly on as-needed basis for muscle tightness pain - Start home exercise with information provided, focus on steady advance - Contact our office for any persistent symptoms that fail to resolve - Proceed for MRI as scheduled - Follow-up as needed

## 2022-03-18 NOTE — Progress Notes (Signed)
     Primary Care / Sports Medicine Office Visit  Patient Information:  Patient ID: Jerry Harmon, male DOB: September 09, 1955 Age: 66 y.o. MRN: [id number]   Jerry Harmon is a pleasant 66 y.o. male presenting with the following:  Chief Complaint  Patient presents with   Arthralgia of right hip    Vitals:   03/18/22 1428  BP: 120/80  Pulse: 64  SpO2: 99%   Vitals:   03/18/22 1428  Weight: 213 lb (96.6 kg)  Height: '5\' 10"'$  (1.778 m)   Body mass index is 30.56 kg/m.     Independent interpretation of notes and tests performed by another provider:   Independent interpretation of right hip x-rays dated 03/04/2022 reveals moderate right hip osteoarthritis, cam deformation at the femoral head/neck on the right, osteophyte formation at the ASIS, no acute osseous processes noted  Procedures performed:   None  Pertinent History, Exam, Impression, and Recommendations:   Problem List Items Addressed This Visit       Musculoskeletal and Integument   Osteoarthritis of right hip - Primary    Recent x-rays demonstrate CAM deformity and osteoarthritis, that being said, he has noted near total resolution of symptoms.  Did have interim back pain and ipsilateral knee pain which have since resolved.  He does continue scheduled diclofenac and cyclobenzaprine.  Examination today with negative FADIR, otherwise benign.  We discussed various treatment strategies and given his interval improvement for this chronic issue, plan for transition to as needed dosing of diclofenac and cyclobenzaprine, home-based rehab program, and follow-up as needed for this.  Recalcitrant symptoms can be addressed with intra-articular corticosteroid injection.      Relevant Medications   cyclobenzaprine (FLEXERIL) 5 MG tablet   diclofenac (VOLTAREN) 75 MG EC tablet   Bone cyst of left femur    Incidentally noted on routine hip x-rays, MRI was ordered, he has a scheduled for tomorrow.  We will follow results once  available.      Other Visit Diagnoses     Arthralgia of right hip       Relevant Medications   cyclobenzaprine (FLEXERIL) 5 MG tablet   diclofenac (VOLTAREN) 75 MG EC tablet        Orders & Medications Meds ordered this encounter  Medications   cyclobenzaprine (FLEXERIL) 5 MG tablet    Sig: Take 1 tablet (5 mg total) by mouth 3 (three) times daily as needed for muscle spasms.    Dispense:  30 tablet    Refill:  0   diclofenac (VOLTAREN) 75 MG EC tablet    Sig: Take 1 tablet (75 mg total) by mouth 2 (two) times daily as needed.    Dispense:  60 tablet    Refill:  0   No orders of the defined types were placed in this encounter.    No follow-ups on file.     Montel Culver, MD, [facility]   Primary Care Sports Medicine Primary Care and Sports Medicine at [facility]

## 2022-03-18 NOTE — Assessment & Plan Note (Signed)
Recent x-rays demonstrate CAM deformity and osteoarthritis, that being said, he has noted near total resolution of symptoms.  Did have interim back pain and ipsilateral knee pain which have since resolved.  He does continue scheduled diclofenac and cyclobenzaprine.  Examination today with negative FADIR, otherwise benign.  We discussed various treatment strategies and given his interval improvement for this chronic issue, plan for transition to as needed dosing of diclofenac and cyclobenzaprine, home-based rehab program, and follow-up as needed for this.  Recalcitrant symptoms can be addressed with intra-articular corticosteroid injection.

## 2022-03-19 ENCOUNTER — Ambulatory Visit
Admission: RE | Admit: 2022-03-19 | Discharge: 2022-03-19 | Disposition: A | Discharge status: Home / Self Care | Payer: BC Managed Care – PPO | Source: Ambulatory Visit | Attending: Family Medicine | Admitting: Family Medicine

## 2022-03-19 DIAGNOSIS — M899 Disorder of bone, unspecified: Secondary | ICD-10-CM | POA: Diagnosis not present

## 2022-03-19 DIAGNOSIS — M1612 Unilateral primary osteoarthritis, left hip: Secondary | ICD-10-CM | POA: Diagnosis not present

## 2022-03-19 DIAGNOSIS — M7072 Other bursitis of hip, left hip: Secondary | ICD-10-CM | POA: Diagnosis not present

## 2022-03-19 MED ORDER — GADOBUTROL 1 MMOL/ML IV SOLN
9.0000 mL | Freq: Once | INTRAVENOUS | Status: AC | PRN
  Administered 2022-03-19: 9 mL via INTRAVENOUS

## 2022-04-13 ENCOUNTER — Other Ambulatory Visit: Payer: Self-pay | Admitting: Family Medicine

## 2022-04-13 DIAGNOSIS — M25551 Pain in right hip: Secondary | ICD-10-CM

## 2022-04-14 NOTE — Telephone Encounter (Signed)
Requested Prescriptions  Pending Prescriptions Disp Refills   diclofenac (VOLTAREN) 75 MG EC tablet [Pharmacy Med Name: DICLOFENAC SOD EC 75 MG TAB] 180 tablet 0    Sig: TAKE 1 TABLET BY MOUTH 2 TIMES DAILY AS NEEDED.     Analgesics:  NSAIDS Failed - 04/13/2022  4:04 PM      Failed - Manual Review: Labs are only required if the patient has taken medication for more than 8 weeks.      Failed - HGB in normal range and within 360 days    Hemoglobin  Date Value Ref Range Status  05/05/2019 13.3 (A) 13.5 - 17.5 Final         Failed - PLT in normal range and within 360 days    Platelets  Date Value Ref Range Status  05/05/2019 191 150 - 399 Final         Failed - HCT in normal range and within 360 days    HCT  Date Value Ref Range Status  05/05/2019 39 (A) 41 - 53 Final         Passed - Cr in normal range and within 360 days    Creatinine, Ser  Date Value Ref Range Status  01/27/2022 1.15 0.76 - 1.27 mg/dL Final         Passed - eGFR is 30 or above and within 360 days    GFR calc Af Amer  Date Value Ref Range Status  01/06/2020 78 >59 mL/min/1.73 Final    Comment:    **[facility] currently reports eGFR in compliance with the current**   recommendations of the [employer]. [facility] will   update reporting as new guidelines are published from the NKF-ASN   Task force.    GFR calc non Af Amer  Date Value Ref Range Status  01/06/2020 68 >59 mL/min/1.73 Final   eGFR  Date Value Ref Range Status  01/27/2022 71 >59 mL/min/1.73 Final         Passed - Patient is not pregnant      Passed - Valid encounter within last 12 months    Recent Outpatient Visits           3 weeks ago Primary osteoarthritis of right hip   [place] [facility] at [place], Earley Abide, MD   1 month ago Arthralgia of right hip   [place] [facility] at [place], Earley Abide, MD   2 months ago Essential hypertension    [place] [facility] at [facility], MD   8 months ago Closed displaced fracture of proximal phalanx of lesser toe of right foot with routine healing, subsequent encounter    Primary Care and Sports Medicine at [facility], Earley Abide, MD   8 months ago Essential hypertension   [place] and Sports Medicine at [place], [place], MD       Future Appointments             In 3 months Juline Patch, MD [place] [facility] at [facility], [facility]

## 2022-04-17 DIAGNOSIS — G4733 Obstructive sleep apnea (adult) (pediatric): Secondary | ICD-10-CM | POA: Diagnosis not present

## 2022-05-01 ENCOUNTER — Other Ambulatory Visit: Payer: Self-pay | Admitting: Family Medicine

## 2022-05-01 DIAGNOSIS — E119 Type 2 diabetes mellitus without complications: Secondary | ICD-10-CM

## 2022-05-01 DIAGNOSIS — M25551 Pain in right hip: Secondary | ICD-10-CM

## 2022-05-01 NOTE — Telephone Encounter (Signed)
Requested medication (s) are due for refill today: yes  Requested medication (s) are on the active medication list: yes  Last refill:  03/18/22 #30/0  Future visit scheduled: yes  Notes to clinic:  Unable to refill per protocol, cannot delegate.     Requested Prescriptions  Pending Prescriptions Disp Refills   cyclobenzaprine (FLEXERIL) 5 MG tablet [Pharmacy Med Name: CYCLOBENZAPRINE 5 MG TABLET] 30 tablet 0    Sig: TAKE 1 TABLET BY MOUTH THREE TIMES A DAY AS NEEDED FOR MUSCLE SPASMS     Not Delegated - Analgesics:  Muscle Relaxants Failed - 05/01/2022  2:13 PM      Failed - This refill cannot be delegated      Passed - Valid encounter within last 6 months    Recent Outpatient Visits           1 month ago Primary osteoarthritis of right hip   [place] [facility] [place] [facility] [place], Earley Abide, MD   1 month ago Arthralgia of right hip   [place] at [place], Earley Abide, MD   3 months ago Essential hypertension   [place] at [facility], MD   8 months ago Closed displaced fracture of proximal phalanx of lesser toe of right foot with routine healing, subsequent encounter   [place] at [place], Earley Abide, MD   9 months ago Essential hypertension   [place] at [place], [place], MD       Future Appointments             In 2 months Juline Patch, MD [place] at [facility], [facility]

## 2022-05-01 NOTE — Telephone Encounter (Signed)
Pt called, LVM to call lancets refill into endo Dr. Honor Junes per last RF from Dr. Ronnald Ramp

## 2022-06-12 DIAGNOSIS — C6291 Malignant neoplasm of right testis, unspecified whether descended or undescended: Secondary | ICD-10-CM | POA: Diagnosis not present

## 2022-06-22 ENCOUNTER — Other Ambulatory Visit: Payer: Self-pay | Admitting: Family Medicine

## 2022-06-22 DIAGNOSIS — M25551 Pain in right hip: Secondary | ICD-10-CM

## 2022-06-23 NOTE — Telephone Encounter (Signed)
Requested medications are due for refill today.  Provider to determine  Requested medications are on the active medications list.  yes  Last refill. 05/02/2022 #30 0 rf  Future visit scheduled.   yes  Notes to clinic.  Refill not delegated.    Requested Prescriptions  Pending Prescriptions Disp Refills   cyclobenzaprine (FLEXERIL) 5 MG tablet [Pharmacy Med Name: CYCLOBENZAPRINE 5 MG TABLET] 30 tablet 0    Sig: TAKE 1 TABLET BY MOUTH THREE TIMES A DAY AS NEEDED FOR MUSCLE SPASM     Not Delegated - Analgesics:  Muscle Relaxants Failed - 06/22/2022  9:42 AM      Failed - This refill cannot be delegated      Passed - Valid encounter within last 6 months    Recent Outpatient Visits           3 months ago Primary osteoarthritis of right hip   [place] [facility] [place], Earley Abide, MD   3 months ago Arthralgia of right hip   [place] [facility] [place] [facility] [place], Earley Abide, MD   4 months ago Essential hypertension   [place] at [facility], MD   10 months ago Closed displaced fracture of proximal phalanx of lesser toe of right foot with routine healing, subsequent encounter   [place] at [facility], Earley Abide, MD   11 months ago Essential hypertension   [place] at [place], [place], MD       Future Appointments             In 1 month Juline Patch, MD [place] at [facility], [facility]

## 2022-06-27 DIAGNOSIS — G4733 Obstructive sleep apnea (adult) (pediatric): Secondary | ICD-10-CM | POA: Diagnosis not present

## 2022-07-16 DIAGNOSIS — E119 Type 2 diabetes mellitus without complications: Secondary | ICD-10-CM | POA: Diagnosis not present

## 2022-07-20 ENCOUNTER — Other Ambulatory Visit: Payer: Self-pay | Admitting: Family Medicine

## 2022-07-20 DIAGNOSIS — M25551 Pain in right hip: Secondary | ICD-10-CM

## 2022-07-21 ENCOUNTER — Other Ambulatory Visit: Payer: Self-pay | Admitting: Family Medicine

## 2022-07-21 DIAGNOSIS — I10 Essential (primary) hypertension: Secondary | ICD-10-CM

## 2022-07-24 ENCOUNTER — Other Ambulatory Visit: Payer: Self-pay | Admitting: Family Medicine

## 2022-07-24 DIAGNOSIS — J301 Allergic rhinitis due to pollen: Secondary | ICD-10-CM

## 2022-07-27 ENCOUNTER — Other Ambulatory Visit: Payer: Self-pay | Admitting: Family Medicine

## 2022-07-27 DIAGNOSIS — G620 Drug-induced polyneuropathy: Secondary | ICD-10-CM

## 2022-07-28 DIAGNOSIS — G4733 Obstructive sleep apnea (adult) (pediatric): Secondary | ICD-10-CM | POA: Diagnosis not present

## 2022-07-28 NOTE — Telephone Encounter (Signed)
Requested medication (s) are due for refill today - yes  Requested medication (s) are on the active medication list -yes  Future visit scheduled -yes  Last refill: -01/27/22 #60 5RF  Notes to clinic: non delegated Rx  Requested Prescriptions  Pending Prescriptions Disp Refills   pregabalin (LYRICA) 100 MG capsule [Pharmacy Med Name: PREGABALIN 100 MG CAPSULE] 60 capsule     Sig: TAKE 1 CAPSULE BY MOUTH TWICE A DAY     Not Delegated - Neurology:  Anticonvulsants - Controlled - pregabalin Failed - 07/27/2022  8:32 AM      Failed - This refill cannot be delegated      Passed - Cr in normal range and within 360 days    Creatinine, Ser  Date Value Ref Range Status  01/27/2022 1.15 0.76 - 1.27 mg/dL Final         Passed - Completed PHQ-2 or PHQ-9 in the last 360 days      Passed - Valid encounter within last 12 months    Recent Outpatient Visits           4 months ago Primary osteoarthritis of right hip   [place] [facility], Ocie Bob, MD   4 months ago Arthralgia of right hip   [place] [facility], Ocie Bob, MD   6 months ago Essential hypertension   [place] [facility], MD   11 months ago Closed displaced fracture of proximal phalanx of lesser toe of right foot with routine healing, subsequent encounter   [place] [facility] at [facility], Ocie Bob, MD   1 year ago Essential hypertension   [place] [facility], MD       Future Appointments             Tomorrow Duanne Limerick, MD [facility] at [facility], [facility]               Requested Prescriptions  Pending Prescriptions Disp Refills   pregabalin (LYRICA) 100 MG capsule [Pharmacy Med Name: PREGABALIN 100 MG CAPSULE] 60 capsule      Sig: TAKE 1 CAPSULE BY MOUTH TWICE A DAY     Not Delegated - Neurology:  Anticonvulsants - Controlled - pregabalin Failed - 07/27/2022  8:32 AM      Failed - This refill cannot be delegated      Passed - Cr in normal range and within 360 days    Creatinine, Ser  Date Value Ref Range Status  01/27/2022 1.15 0.76 - 1.27 mg/dL Final         Passed - Completed PHQ-2 or PHQ-9 in the last 360 days      Passed - Valid encounter within last 12 months    Recent Outpatient Visits           4 months ago Primary osteoarthritis of right hip   [place] [facility], Ocie Bob, MD   4 months ago Arthralgia of right hip   [facility] at [facility], Ocie Bob, MD   6 months ago Essential hypertension   [place] [facility], MD   11 months  ago Closed displaced fracture of proximal phalanx of lesser toe of right foot with routine healing, subsequent encounter   [place] [facility] at [facility], Ocie Bob, MD   1 year ago Essential hypertension   [place] [facility], MD       Future Appointments             Tomorrow Duanne Limerick, MD [facility] at [facility], [facility]

## 2022-07-29 ENCOUNTER — Encounter: Payer: Self-pay | Admitting: Family Medicine

## 2022-07-29 ENCOUNTER — Ambulatory Visit (INDEPENDENT_AMBULATORY_CARE_PROVIDER_SITE_OTHER): Payer: BC Managed Care – PPO | Admitting: Family Medicine

## 2022-07-29 VITALS — BP 124/72 | HR 96 | Ht 70.0 in | Wt 208.0 lb

## 2022-07-29 DIAGNOSIS — T451X5A Adverse effect of antineoplastic and immunosuppressive drugs, initial encounter: Secondary | ICD-10-CM

## 2022-07-29 DIAGNOSIS — I10 Essential (primary) hypertension: Secondary | ICD-10-CM | POA: Diagnosis not present

## 2022-07-29 DIAGNOSIS — J301 Allergic rhinitis due to pollen: Secondary | ICD-10-CM | POA: Diagnosis not present

## 2022-07-29 DIAGNOSIS — B001 Herpesviral vesicular dermatitis: Secondary | ICD-10-CM

## 2022-07-29 DIAGNOSIS — N528 Other male erectile dysfunction: Secondary | ICD-10-CM | POA: Diagnosis not present

## 2022-07-29 DIAGNOSIS — G620 Drug-induced polyneuropathy: Secondary | ICD-10-CM

## 2022-07-29 DIAGNOSIS — E785 Hyperlipidemia, unspecified: Secondary | ICD-10-CM

## 2022-07-29 DIAGNOSIS — G629 Polyneuropathy, unspecified: Secondary | ICD-10-CM

## 2022-07-29 DIAGNOSIS — N400 Enlarged prostate without lower urinary tract symptoms: Secondary | ICD-10-CM

## 2022-07-29 MED ORDER — PREGABALIN 100 MG PO CAPS
100.0000 mg | ORAL_CAPSULE | Freq: Two times a day (BID) | ORAL | 5 refills | Status: DC
Start: 2022-07-29 — End: 2023-01-29

## 2022-07-29 MED ORDER — SILDENAFIL CITRATE 20 MG PO TABS
ORAL_TABLET | ORAL | 3 refills | Status: DC
Start: 2022-07-29 — End: 2022-10-06

## 2022-07-29 MED ORDER — METOPROLOL TARTRATE 50 MG PO TABS
50.0000 mg | ORAL_TABLET | Freq: Two times a day (BID) | ORAL | 1 refills | Status: DC
Start: 2022-07-29 — End: 2023-01-29

## 2022-07-29 MED ORDER — HYDROCHLOROTHIAZIDE 12.5 MG PO CAPS: 12.5000 mg | ORAL_CAPSULE | Freq: Every day | ORAL | 1 refills | Status: DC

## 2022-07-29 MED ORDER — VALACYCLOVIR HCL 1 G PO TABS: 2000.0000 mg | ORAL_TABLET | Freq: Every day | ORAL | 1 refills | Status: DC

## 2022-07-29 MED ORDER — LISINOPRIL 20 MG PO TABS: 20.0000 mg | ORAL_TABLET | Freq: Every day | ORAL | 1 refills | Status: DC

## 2022-07-29 MED ORDER — LORATADINE 10 MG PO TABS: 10.0000 mg | ORAL_TABLET | Freq: Every day | ORAL | 1 refills | Status: DC

## 2022-07-29 NOTE — Progress Notes (Signed)
Date:  07/29/2022   Name:  Jerry Harmon   DOB:  07-08-1955   MRN:  [id number]   Chief Complaint: Hypertension, Allergic Rhinitis , Erectile Dysfunction, Mouth Lesions (Cold sores), and Peripheral Neuropathy  Hypertension This is a chronic problem. The current episode started more than 1 year ago. The problem has been gradually improving since onset. The problem is controlled. Pertinent negatives include no anxiety, blurred vision, chest pain, headaches, malaise/fatigue, neck pain, orthopnea, palpitations, peripheral edema, PND, shortness of breath or sweats. There are no associated agents to hypertension. There are no known risk factors for coronary artery disease. Past treatments include beta blockers, ACE inhibitors and diuretics. The current treatment provides moderate improvement. There are no compliance problems.  There is no history of CAD/MI or CVA. There is no history of chronic renal disease, a hypertension causing med or renovascular disease.  Erectile Dysfunction This is a chronic problem. The problem has been gradually improving since onset. The nature of his difficulty is achieving erection, maintaining erection and penetration. Irritative symptoms do not include frequency, nocturia or urgency. Pertinent negatives include no chills, dysuria, hematuria, hesitancy or inability to urinate. Past treatments include sildenafil. The treatment provided moderate relief. He has had headaches caused by medications.  Mouth Lesions  The current episode started more than 1 week ago. The problem occurs continuously. The problem is moderate. Associated symptoms include mouth sores. Pertinent negatives include no orthopnea, no fever, no constipation, no diarrhea, no headaches, no neck pain, no cough and no wheezing.    Lab Results  Component Value Date   NA 142 01/27/2022   K 4.5 01/27/2022   CO2 22 01/27/2022   GLUCOSE 118 (H) 01/27/2022   BUN 27 01/27/2022   CREATININE 1.15 01/27/2022    CALCIUM 9.8 01/27/2022   EGFR 71 01/27/2022   GFRNONAA 68 01/06/2020   Lab Results  Component Value Date   CHOL 110 07/24/2021   HDL 42 07/24/2021   LDLCALC 55 07/24/2021   TRIG 58 07/24/2021   CHOLHDL 3.8 01/12/2019   Lab Results  Component Value Date   TSH 2.600 03/12/2015   Lab Results  Component Value Date   HGBA1C 7.0 (H) 12/27/2020   Lab Results  Component Value Date   WBC 5.8 05/05/2019   HGB 13.3 (A) 05/05/2019   HCT 39 (A) 05/05/2019   PLT 191 05/05/2019   Lab Results  Component Value Date   ALT 16 12/27/2020   AST 14 12/27/2020   ALKPHOS 65 12/27/2020   BILITOT 0.4 12/27/2020   No results found for: "25OHVITD2", "25OHVITD3", "VD25OH"   Review of Systems  Constitutional:  Negative for chills, fever, malaise/fatigue and unexpected weight change.  HENT:  Positive for mouth sores. Negative for trouble swallowing.   Eyes:  Negative for blurred vision.  Respiratory:  Negative for cough, chest tightness, shortness of breath and wheezing.   Cardiovascular:  Negative for chest pain, palpitations, orthopnea, leg swelling and PND.  Gastrointestinal:  Negative for blood in stool, constipation and diarrhea.  Genitourinary:  Negative for difficulty urinating, dysuria, frequency, hematuria, hesitancy, nocturia, testicular pain and urgency.  Musculoskeletal:  Negative for neck pain.  Neurological:  Negative for headaches.    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  Current Meds  Medication Sig   allopurinol (ZYLOPRIM) 100 MG tablet Take 200 mg by mouth daily. ortho   aspirin 81 MG tablet Take 1 tablet by mouth daily.   diclofenac (VOLTAREN) 75 MG EC tablet TAKE 1 TABLET BY MOUTH 2 TIMES DAILY AS NEEDED.   hydrochlorothiazide (MICROZIDE) 12.5 MG capsule Take 1 capsule (12.5 mg total) by mouth daily.   Lancets (ONETOUCH ULTRASOFT) lancets One test daily   lisinopril (ZESTRIL) 20 MG tablet TAKE 1 TABLET BY MOUTH EVERY DAY   loratadine (CLARITIN) 10 MG tablet TAKE 1 TABLET BY MOUTH EVERY DAY   metoprolol tartrate (LOPRESSOR) 50 MG tablet Take 1 tablet (50 mg total) by mouth 2 (two) times daily.   Multiple Vitamins-Minerals (CENTRUM SILVER ADULT 50+ PO) Take 1 tablet by mouth daily.   ONETOUCH ULTRA test strip USE 1 STRIP TO TEST DAILY E11.9   pregabalin (LYRICA) 100 MG capsule Take 1 capsule (100 mg total) by mouth 2 (two) times daily.   rosuvastatin (CRESTOR) 5 MG tablet Take by mouth.   Semaglutide,0.25 or 0.5MG /DOS, 2 MG/3ML SOPN Inject into the skin.   sildenafil (REVATIO) 20 MG tablet TAKE 1 TABLET BY MOUTH AS NEEDED BEFORE SEXUAL ACTIVITY   SYNJARDY XR 12.08-998 MG TB24 Take 2 tablets by mouth daily. [place]   valACYclovir (VALTREX) 1000 MG tablet Take 2 tablets (2,000 mg total) by mouth daily. 2 tablets today and 2 tomorrow.   [DISCONTINUED] metFORMIN (GLUCOPHAGE-XR) 750  MG 24 hr tablet Take 1 tablet by mouth 2 (two) times daily.       07/29/2022    1:56 PM 01/27/2022    3:31 PM 08/06/2021    1:45 PM 07/02/2021    1:55 PM  GAD 7 : Generalized Anxiety Score  Nervous, Anxious, on Edge 0 0 0 0  Control/stop worrying 0 0 0 0  Worry too much - different things 0 0 0 0  Trouble relaxing 0 0 0 0  Restless 0 0 0 0  Easily annoyed or irritable 0 0 0 0  Afraid - awful might happen 0 0 0 0  Total GAD 7 Score 0 0 0 0  Anxiety Difficulty Not difficult at all Not difficult at all Not difficult at all Not difficult at all       07/29/2022    1:56 PM 01/27/2022    3:31 PM 08/06/2021    1:45 PM  Depression screen PHQ 2/9  Decreased Interest 0 0 0  Down, Depressed, Hopeless 0 0 0  PHQ - 2 Score 0 0 0  Altered sleeping 0 0 1  Tired, decreased energy 0 0 1  Change in appetite 0 0 0  Feeling bad or failure about yourself  0 0 0  Trouble concentrating 0 0 0  Moving slowly or fidgety/restless 0 0 0  Suicidal thoughts 0 0 0  PHQ-9 Score 0 0 2  Difficult doing work/chores Not difficult at all Not difficult at all     BP Readings from Last 3 Encounters:  07/29/22 124/72  03/18/22 120/80  03/04/22 128/78    Physical Exam Vitals and nursing note reviewed.  HENT:     Head: Normocephalic.     Right Ear: Tympanic membrane and external ear normal.     Left Ear: Tympanic membrane and external ear normal.     Nose: Nose normal.  Eyes:     General: No scleral icterus.       Right eye: No discharge.        Left eye: No discharge.     Conjunctiva/sclera: Conjunctivae normal.     Pupils: Pupils are equal, round, and reactive to light.  Neck:     Thyroid: No thyromegaly.     Vascular: No JVD.     Trachea: No tracheal deviation.  Cardiovascular:     Rate and Rhythm: Normal rate and regular rhythm.     Heart sounds: Normal heart sounds. No murmur heard.    No friction rub. No gallop.  Pulmonary:     Effort: No respiratory distress.     Breath sounds: Normal  breath sounds. No wheezing or rales.  Abdominal:     General: Bowel sounds are normal.     Palpations: Abdomen is soft. There is no mass.     Tenderness: There is no abdominal tenderness. There is no guarding or rebound.  Genitourinary:    Prostate: Normal. Not enlarged, not tender and no nodules present.  Musculoskeletal:        General: No tenderness. Normal range of motion.     Cervical back: Normal range of motion and neck supple.  Lymphadenopathy:     Cervical: No cervical adenopathy.  Skin:    General: Skin is warm.     Findings: No rash.  Neurological:     Mental Status: He is alert and oriented to person, place, and time.     Cranial Nerves: No cranial nerve deficit.     Deep Tendon Reflexes: Reflexes are normal and symmetric.     Wt Readings from Last 3 Encounters:  07/29/22 208 lb (94.3 kg)  03/18/22 213 lb (96.6 kg)  03/04/22 213 lb (96.6 kg)    BP 124/72   Pulse 96   Ht 5\' 10"  (1.778 m)   Wt 208 lb (94.3 kg)   SpO2 98%   BMI 29.84 kg/m   Assessment and Plan: 1. Essential hypertension Chronic.  Controlled.  Stable.  Blood pressure today is 124/72.  Continue hydrochlorothiazide 12.5 mg once a day, lisinopril 20 mg once a day, and metoprolol 50 mg 1 twice a day.  Will recheck in 6 months chronic.  Controlled.  Stable.  Continue loratadine 10 mg daily. - hydrochlorothiazide (MICROZIDE) 12.5 MG capsule; Take 1 capsule (12.5 mg total) by mouth daily.  Dispense: 90 capsule; Refill: 1 - lisinopril (ZESTRIL) 20 MG tablet; Take 1 tablet (20 mg total) by mouth daily.  Dispense: 90 tablet; Refill: 1 - metoprolol tartrate (LOPRESSOR) 50 MG tablet; Take 1 tablet (50 mg total) by mouth 2 (two) times daily.  Dispense: 180  tablet; Refill: 1  2. Seasonal allergic rhinitis due to pollen Chronic.  Controlled.  Stable.  Continue Claritin 10 mg once a day. - loratadine (CLARITIN) 10 MG tablet; Take 1 tablet (10 mg total) by mouth daily.  Dispense: 90 tablet; Refill: 1  3.  Peripheral neuropathy due to chemotherapy Chronic.  Controlled.  Stable.  Continue Lyrica 100 mg twice a day. - pregabalin (LYRICA) 100 MG capsule; Take 1 capsule (100 mg total) by mouth 2 (two) times daily.  Dispense: 60 capsule; Refill: 5  4. Other male erectile dysfunction Chronic.  Controlled.  Stable.  PSA is normal we will continue sildenafil as needed - sildenafil (REVATIO) 20 MG tablet; TAKE 1 TABLET BY MOUTH AS NEEDED BEFORE SEXUAL ACTIVITY  Dispense: 30 tablet; Refill: 3  5. Fever blister Chronic.  Controlled.  Stable.  As needed Valtrex 1 g 2 twice a day as needed - valACYclovir (VALTREX) 1000 MG tablet; Take 2 tablets (2,000 mg total) by mouth daily. 2 tablets today and 2 tomorrow.  Dispense: 10 tablet; Refill: 1  6. Neuropathy As noted above patient is on Lyrica.  7. Benign prostatic hyperplasia without lower urinary tract symptoms Chronic.  Asymptomatic.  Stable.  Will check PSA with DRE that was normal. - PSA  8. Dyslipidemia Chronic.  Controlled.  Stable.  Will check lipid panel for evaluation of current LDL. - Lipid Panel With LDL/HDL Ratio     Elizabeth Sauer, MD

## 2022-07-30 LAB — PSA: Prostate Specific Ag, Serum: 2.7 ng/mL (ref 0.0–4.0)

## 2022-07-30 LAB — LIPID PANEL WITH LDL/HDL RATIO
Cholesterol, Total: 122 mg/dL (ref 100–199)
HDL: 43 mg/dL (ref >39)
LDL Chol Calc (NIH): 65 mg/dL (ref 0–99)
LDL/HDL Ratio: 1.5 ratio (ref 0.0–3.6)
Triglycerides: 70 mg/dL (ref 0–149)
VLDL Cholesterol Cal: 14 mg/dL (ref 5–40)

## 2022-08-27 DIAGNOSIS — G4733 Obstructive sleep apnea (adult) (pediatric): Secondary | ICD-10-CM | POA: Diagnosis not present

## 2022-09-04 DIAGNOSIS — Z85820 Personal history of malignant melanoma of skin: Secondary | ICD-10-CM | POA: Diagnosis not present

## 2022-09-04 DIAGNOSIS — L578 Other skin changes due to chronic exposure to nonionizing radiation: Secondary | ICD-10-CM | POA: Diagnosis not present

## 2022-09-04 DIAGNOSIS — Z872 Personal history of diseases of the skin and subcutaneous tissue: Secondary | ICD-10-CM | POA: Diagnosis not present

## 2022-09-04 DIAGNOSIS — Z859 Personal history of malignant neoplasm, unspecified: Secondary | ICD-10-CM | POA: Diagnosis not present

## 2022-09-04 DIAGNOSIS — L570 Actinic keratosis: Secondary | ICD-10-CM | POA: Diagnosis not present

## 2022-09-21 ENCOUNTER — Other Ambulatory Visit: Payer: Self-pay | Admitting: Family Medicine

## 2022-09-21 DIAGNOSIS — M25551 Pain in right hip: Secondary | ICD-10-CM

## 2022-09-22 NOTE — Telephone Encounter (Signed)
Requested medication (s) are due for refill today: yes  Requested medication (s) are on the active medication list: yes  Last refill:  04/14/22  Future visit scheduled: yes  Notes to clinic:  Unable to refill per protocol due to failed labs, no updated results.      Requested Prescriptions  Pending Prescriptions Disp Refills   diclofenac (VOLTAREN) 75 MG EC tablet [Pharmacy Med Name: DICLOFENAC SOD EC 75 MG TAB] 180 tablet 0    Sig: TAKE 1 TABLET BY MOUTH TWICE A DAY AS NEEDED     Analgesics:  NSAIDS Failed - 09/21/2022  8:37 PM      Failed - Manual Review: Labs are only required if the patient has taken medication for more than 8 weeks.      Failed - HGB in normal range and within 360 days    Hemoglobin  Date Value Ref Range Status  05/05/2019 13.3 (A) 13.5 - 17.5 Final         Failed - PLT in normal range and within 360 days    Platelets  Date Value Ref Range Status  05/05/2019 191 150 - 399 Final         Failed - HCT in normal range and within 360 days    HCT  Date Value Ref Range Status  05/05/2019 39 (A) 41 - 53 Final         Passed - Cr in normal range and within 360 days    Creatinine, Ser  Date Value Ref Range Status  01/27/2022 1.15 0.76 - 1.27 mg/dL Final         Passed - eGFR is 30 or above and within 360 days    GFR calc Af Amer  Date Value Ref Range Status  01/06/2020 78 >59 mL/min/1.73 Final    Comment:    **[facility] currently reports eGFR in compliance with the current**   recommendations of the [employer]. [facility] will   update reporting as new guidelines are published from the NKF-ASN   Task force.    GFR calc non Af Amer  Date Value Ref Range Status  01/06/2020 68 >59 mL/min/1.73 Final   eGFR  Date Value Ref Range Status  01/27/2022 71 >59 mL/min/1.73 Final         Passed - Patient is not pregnant      Passed - Valid encounter within last 12 months    Recent Outpatient Visits           1 month ago Essential  hypertension   [place] [facility], [facility]   6 months ago Primary osteoarthritis of right hip   [place] [facility], Ocie Bob, MD   6 months ago Arthralgia of right hip   [place] [facility] at [facility], Ocie Bob, MD   7 months ago Essential hypertension   [place] [facility] at [facility], MD   1 year ago Closed displaced fracture of proximal phalanx of lesser toe of right foot with routine healing, subsequent encounter   [place] [facility] at [facility], Ocie Bob, MD       Future Appointments             In 4 months Duanne Limerick, MD [place] [facility] at [facility],  [facility]

## 2022-10-05 ENCOUNTER — Other Ambulatory Visit: Payer: Self-pay | Admitting: Family Medicine

## 2022-10-05 DIAGNOSIS — N528 Other male erectile dysfunction: Secondary | ICD-10-CM

## 2022-10-06 DIAGNOSIS — G4733 Obstructive sleep apnea (adult) (pediatric): Secondary | ICD-10-CM | POA: Diagnosis not present

## 2022-10-26 ENCOUNTER — Other Ambulatory Visit: Payer: Self-pay | Admitting: Family Medicine

## 2022-10-26 DIAGNOSIS — M25551 Pain in right hip: Secondary | ICD-10-CM

## 2022-10-28 NOTE — Telephone Encounter (Signed)
Requested medications are due for refill today.  yes  Requested medications are on the active medications list.  yes  Last refill. 04/14/2022 #180 0 rf  Future visit scheduled.   yes  Notes to clinic.  Labs are exxpired.    Requested Prescriptions  Pending Prescriptions Disp Refills   diclofenac (VOLTAREN) 75 MG EC tablet [Pharmacy Med Name: DICLOFENAC SOD EC 75 MG TAB] 180 tablet 0    Sig: TAKE 1 TABLET BY MOUTH TWICE A DAY AS NEEDED     Analgesics:  NSAIDS Failed - 10/26/2022  8:12 AM      Failed - Manual Review: Labs are only required if the patient has taken medication for more than 8 weeks.      Failed - HGB in normal range and within 360 days    Hemoglobin  Date Value Ref Range Status  05/05/2019 13.3 (A) 13.5 - 17.5 Final         Failed - PLT in normal range and within 360 days    Platelets  Date Value Ref Range Status  05/05/2019 191 150 - 399 Final         Failed - HCT in normal range and within 360 days    HCT  Date Value Ref Range Status  05/05/2019 39 (A) 41 - 53 Final         Passed - Cr in normal range and within 360 days    Creatinine, Ser  Date Value Ref Range Status  01/27/2022 1.15 0.76 - 1.27 mg/dL Final         Passed - eGFR is 30 or above and within 360 days    GFR calc Af Amer  Date Value Ref Range Status  01/06/2020 78 >59 mL/min/1.73 Final    Comment:    **[facility] currently reports eGFR in compliance with the current**   recommendations of the [employer]. [facility] will   update reporting as new guidelines are published from the NKF-ASN   Task force.    GFR calc non Af Amer  Date Value Ref Range Status  01/06/2020 68 >59 mL/min/1.73 Final   eGFR  Date Value Ref Range Status  01/27/2022 71 >59 mL/min/1.73 Final         Passed - Patient is not pregnant      Passed - Valid encounter within last 12 months    Recent Outpatient Visits           3 months ago Essential hypertension   [place] [facility], MD   7 months ago Primary osteoarthritis of right hip   [place] [facility], Ocie Bob, MD   7 months ago Arthralgia of right hip   [place] [facility], Ocie Bob, MD   9 months ago Essential hypertension   [place] [facility], MD   1 year ago Closed displaced fracture of proximal phalanx of lesser toe of right foot with routine healing, subsequent encounter   [facility] at [facility], Ocie Bob, MD       Future Appointments             In 3 months Duanne Limerick, MD [facility] at [facility], [facility]

## 2022-11-05 ENCOUNTER — Other Ambulatory Visit: Payer: Self-pay | Admitting: Family Medicine

## 2022-11-05 DIAGNOSIS — N528 Other male erectile dysfunction: Secondary | ICD-10-CM

## 2022-11-05 NOTE — Telephone Encounter (Signed)
Requested medication (s) are due for refill today: Yes  Requested medication (s) are on the active medication list: Yes  Last refill:  10/06/22 #30 0RF  Future visit scheduled: Yes  Notes to clinic:  Unable to refill per protocol due to failed labs, no updated results.      Requested Prescriptions  Pending Prescriptions Disp Refills   sildenafil (REVATIO) 20 MG tablet [Pharmacy Med Name: SILDENAFIL 20 MG TABLET] 30 tablet 0    Sig: TAKE 1 TABLET BY MOUTH AS NEEDED BEFORE SEXUAL ACTIVITY     Urology: Erectile Dysfunction Agents Failed - 11/05/2022  2:11 AM      Failed - AST in normal range and within 360 days    AST  Date Value Ref Range Status  12/27/2020 14 0 - 40 IU/L Final         Failed - ALT in normal range and within 360 days    ALT  Date Value Ref Range Status  12/27/2020 16 0 - 44 IU/L Final         Passed - Last BP in normal range    BP Readings from Last 1 Encounters:  07/29/22 124/72         Passed - Valid encounter within last 12 months    Recent Outpatient Visits           3 months ago Essential hypertension   [place] [facility], MD   7 months ago Primary osteoarthritis of right hip   [place] [facility], Ocie Bob, MD   8 months ago Arthralgia of right hip   [place] [facility] at [facility], Ocie Bob, MD   9 months ago Essential hypertension    [facility] at [facility], MD   1 year ago Closed displaced fracture of proximal phalanx of lesser toe of right foot with routine healing, subsequent encounter   [facility] at [facility], Ocie Bob, MD       Future Appointments             In 2 months Duanne Limerick, MD [facility] at [facility], [facility]

## 2022-11-06 DIAGNOSIS — G4733 Obstructive sleep apnea (adult) (pediatric): Secondary | ICD-10-CM | POA: Diagnosis not present

## 2022-11-21 DIAGNOSIS — E1169 Type 2 diabetes mellitus with other specified complication: Secondary | ICD-10-CM | POA: Diagnosis not present

## 2022-11-21 DIAGNOSIS — E119 Type 2 diabetes mellitus without complications: Secondary | ICD-10-CM | POA: Diagnosis not present

## 2022-11-21 DIAGNOSIS — I152 Hypertension secondary to endocrine disorders: Secondary | ICD-10-CM | POA: Diagnosis not present

## 2022-11-21 DIAGNOSIS — E1159 Type 2 diabetes mellitus with other circulatory complications: Secondary | ICD-10-CM | POA: Diagnosis not present

## 2022-11-29 ENCOUNTER — Other Ambulatory Visit: Payer: Self-pay | Admitting: Family Medicine

## 2022-11-29 DIAGNOSIS — N528 Other male erectile dysfunction: Secondary | ICD-10-CM

## 2022-12-07 DIAGNOSIS — G4733 Obstructive sleep apnea (adult) (pediatric): Secondary | ICD-10-CM | POA: Diagnosis not present

## 2022-12-31 DIAGNOSIS — E113293 Type 2 diabetes mellitus with mild nonproliferative diabetic retinopathy without macular edema, bilateral: Secondary | ICD-10-CM | POA: Diagnosis not present

## 2022-12-31 DIAGNOSIS — H5213 Myopia, bilateral: Secondary | ICD-10-CM | POA: Diagnosis not present

## 2022-12-31 DIAGNOSIS — H04123 Dry eye syndrome of bilateral lacrimal glands: Secondary | ICD-10-CM | POA: Diagnosis not present

## 2023-01-14 ENCOUNTER — Other Ambulatory Visit: Payer: Self-pay | Admitting: Family Medicine

## 2023-01-14 ENCOUNTER — Other Ambulatory Visit: Payer: Self-pay

## 2023-01-14 DIAGNOSIS — E119 Type 2 diabetes mellitus without complications: Secondary | ICD-10-CM

## 2023-01-14 MED ORDER — ONETOUCH ULTRA VI STRP: ORAL_STRIP | 2 refills | Status: AC

## 2023-01-19 DIAGNOSIS — G4733 Obstructive sleep apnea (adult) (pediatric): Secondary | ICD-10-CM | POA: Diagnosis not present

## 2023-01-25 ENCOUNTER — Other Ambulatory Visit: Payer: Self-pay | Admitting: Family Medicine

## 2023-01-25 DIAGNOSIS — N528 Other male erectile dysfunction: Secondary | ICD-10-CM

## 2023-01-26 ENCOUNTER — Other Ambulatory Visit: Payer: Self-pay | Admitting: Family Medicine

## 2023-01-26 DIAGNOSIS — G620 Drug-induced polyneuropathy: Secondary | ICD-10-CM

## 2023-01-29 ENCOUNTER — Ambulatory Visit (INDEPENDENT_AMBULATORY_CARE_PROVIDER_SITE_OTHER): Payer: BC Managed Care – PPO | Admitting: Family Medicine

## 2023-01-29 VITALS — BP 122/76 | HR 90 | Ht 70.0 in | Wt 216.0 lb

## 2023-01-29 DIAGNOSIS — N528 Other male erectile dysfunction: Secondary | ICD-10-CM | POA: Diagnosis not present

## 2023-01-29 DIAGNOSIS — I10 Essential (primary) hypertension: Secondary | ICD-10-CM | POA: Diagnosis not present

## 2023-01-29 DIAGNOSIS — G620 Drug-induced polyneuropathy: Secondary | ICD-10-CM

## 2023-01-29 DIAGNOSIS — T451X5A Adverse effect of antineoplastic and immunosuppressive drugs, initial encounter: Secondary | ICD-10-CM

## 2023-01-29 DIAGNOSIS — B001 Herpesviral vesicular dermatitis: Secondary | ICD-10-CM | POA: Diagnosis not present

## 2023-01-29 MED ORDER — METOPROLOL TARTRATE 50 MG PO TABS: 50.0000 mg | ORAL_TABLET | Freq: Two times a day (BID) | ORAL | 1 refills | Status: DC

## 2023-01-29 MED ORDER — VALACYCLOVIR HCL 1 G PO TABS: 2000.0000 mg | ORAL_TABLET | Freq: Every day | ORAL | 1 refills | Status: AC

## 2023-01-29 MED ORDER — PREGABALIN 100 MG PO CAPS: 100.0000 mg | ORAL_CAPSULE | Freq: Two times a day (BID) | ORAL | 5 refills | Status: DC

## 2023-01-29 MED ORDER — LISINOPRIL 20 MG PO TABS: 20.0000 mg | ORAL_TABLET | Freq: Every day | ORAL | 1 refills | Status: DC

## 2023-01-29 MED ORDER — HYDROCHLOROTHIAZIDE 12.5 MG PO CAPS: 12.5000 mg | ORAL_CAPSULE | Freq: Every day | ORAL | 1 refills | Status: DC

## 2023-01-29 MED ORDER — SILDENAFIL CITRATE 20 MG PO TABS
ORAL_TABLET | ORAL | 0 refills | Status: AC
Start: 2023-01-29 — End: ?

## 2023-01-29 NOTE — Progress Notes (Signed)
Date:  01/29/2023   Name:  Jerry Harmon   DOB:  [birth year]   MRN:  [id number]   Chief Complaint: Hypertension  Hypertension This is a chronic problem. The current episode started more than 1 year ago. The problem has been gradually improving since onset. The problem is controlled. Pertinent negatives include no anxiety, blurred vision, chest pain, headaches, malaise/fatigue, neck pain, orthopnea, palpitations, peripheral edema, PND, shortness of breath or sweats. There are no associated agents to hypertension. Risk factors for coronary artery disease include dyslipidemia. Past treatments include ACE inhibitors, calcium channel blockers and diuretics.  Erectile Dysfunction This is a chronic problem. The current episode started more than 1 year ago. The problem has been gradually improving since onset. The nature of his difficulty is achieving erection. Past treatments include sildenafil. The treatment provided moderate relief.  Neurologic Problem Primary symptoms comment: for neuropathy. This is a chronic problem. The problem has been gradually improving since onset. There was lower extremity focality noted. Pertinent negatives include no chest pain, headaches, neck pain, palpitations or shortness of breath. Treatments tried: Lyrica. The treatment provided moderate relief.    Lab Results  Component Value Date   NA 142 01/27/2022   K 4.5 01/27/2022   CO2 22 01/27/2022   GLUCOSE 118 (H) 01/27/2022   BUN 27 01/27/2022   CREATININE 1.15 01/27/2022   CALCIUM 9.8 01/27/2022   EGFR 71 01/27/2022   GFRNONAA 68 01/06/2020   Lab Results  Component Value Date   CHOL 122 07/29/2022   HDL 43 07/29/2022   LDLCALC 65 07/29/2022   TRIG 70 07/29/2022   CHOLHDL 3.8 01/12/2019   Lab Results  Component Value Date   TSH 2.600 03/12/2015   Lab Results  Component Value Date   HGBA1C 7.0 (H) 12/27/2020   Lab Results  Component Value Date   WBC 5.8 05/05/2019   HGB 13.3 (A) 05/05/2019   HCT  39 (A) 05/05/2019   PLT 191 05/05/2019   Lab Results  Component Value Date   ALT 16 12/27/2020   AST 14 12/27/2020   ALKPHOS 65 12/27/2020   BILITOT 0.4 12/27/2020   No results found for: "25OHVITD2", "25OHVITD3", "VD25OH"   Review of Systems  Constitutional:  Negative for malaise/fatigue and unexpected weight change.  Eyes:  Negative for blurred vision.  Respiratory:  Negative for cough, choking, chest tightness, shortness of breath and wheezing.   Cardiovascular:  Negative for chest pain, palpitations, orthopnea and PND.  Musculoskeletal:  Negative for neck pain.  Neurological:  Negative for headaches.    Patient Active Problem List   Diagnosis Date Noted   Bone cyst of left femur 03/18/2022   Osteoarthritis of right hip 03/04/2022   Closed displaced fracture of proximal phalanx of lesser toe of right foot 07/02/2021   Fever blister 07/01/2019   Pain of left hand 03/07/2019   Adjustment disorder 03/27/2016   Neuropathy 11/13/2015   Allergic rhinitis due to pollen 11/13/2015   BP (high blood pressure) 09/11/2014   Cardiomyopathy (HCC) 09/11/2014   Sleep apnea 09/11/2014   Blood pressure elevated without history of HTN 09/11/2014   Erectile dysfunction 09/11/2014   Lump in testis 09/11/2014   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  Current Meds  Medication Sig   allopurinol (ZYLOPRIM) 100 MG tablet Take 200 mg by mouth daily. ortho   aspirin 81 MG tablet Take 1 tablet by  mouth daily.   cyclobenzaprine (FLEXERIL) 5 MG tablet TAKE 1 TABLET BY MOUTH THREE TIMES A DAY AS NEEDED FOR MUSCLE SPASM   diclofenac (VOLTAREN) 75 MG EC tablet TAKE 1 TABLET BY MOUTH TWICE A DAY AS NEEDED   glucose blood (ONETOUCH ULTRA) test strip Use as instructed   hydrochlorothiazide (MICROZIDE) 12.5 MG capsule Take 1 capsule (12.5 mg total) by mouth daily.   ibuprofen (ADVIL,MOTRIN) 200 MG tablet Take 200 mg by mouth as needed.   Lancets (ONETOUCH DELICA PLUS LANCET33G) MISC ONE TEST DAILY   lisinopril (ZESTRIL) 20 MG tablet Take 1 tablet (20 mg total) by mouth daily.   loratadine (CLARITIN) 10 MG tablet Take 1 tablet (10 mg total) by mouth daily.   metoprolol tartrate (LOPRESSOR) 50 MG tablet Take 1 tablet (50 mg total) by mouth 2 (two) times daily.   Multiple Vitamins-Minerals (CENTRUM SILVER ADULT 50+ PO) Take 1 tablet by mouth daily.   pregabalin (LYRICA) 100 MG capsule Take 1 capsule (100 mg total) by mouth 2 (two) times daily.   rosuvastatin (CRESTOR) 5 MG tablet Take by mouth.   Semaglutide,0.25 or 0.5MG /DOS, 2 MG/3ML SOPN Inject into the skin.   sildenafil (REVATIO) 20 MG tablet TAKE 1 TABLET BY MOUTH AS NEEDED BEFORE SEXUAL ACTIVITY   SYNJARDY XR 12.08-998 MG TB24 Take 2 tablets by mouth daily. [place]   valACYclovir (VALTREX) 1000 MG tablet Take 2 tablets (2,000 mg total) by mouth daily. 2 tablets today and 2 tomorrow.   venlafaxine (EFFEXOR) 37.5 MG tablet Take 37.5 mg by mouth.       01/29/2023    1:45 PM 07/29/2022    1:56 PM 01/27/2022    3:31 PM 08/06/2021    1:45 PM  GAD 7 : Generalized Anxiety Score  Nervous, Anxious, on Edge 0 0 0 0  Control/stop worrying 0 0 0 0  Worry too much - different things 0 0 0 0  Trouble relaxing 1 0 0 0  Restless 2 0 0 0  Easily annoyed or irritable 0 0 0 0  Afraid - awful might happen 0 0 0 0  Total GAD 7 Score 3 0 0 0  Anxiety Difficulty Not difficult at all Not difficult at all Not difficult at all Not difficult at all        01/29/2023    1:45 PM 07/29/2022    1:56 PM 01/27/2022    3:31 PM  Depression screen PHQ 2/9  Decreased Interest 0 0 0  Down, Depressed, Hopeless 0 0 0  PHQ - 2 Score 0 0 0  Altered sleeping 2 0 0  Tired, decreased energy 3 0 0  Change in appetite 0 0 0  Feeling bad or failure about yourself  0 0 0  Trouble concentrating 0 0 0  Moving slowly or fidgety/restless 2 0 0  Suicidal thoughts 0 0 0  PHQ-9 Score 7 0 0  Difficult doing work/chores Not difficult at all Not difficult at all Not difficult at all    BP Readings  from Last 3 Encounters:  01/29/23 122/76  07/29/22 124/72  03/18/22 120/80    Physical Exam Vitals and nursing note reviewed.  HENT:     Head: Normocephalic.     Right Ear: Tympanic membrane, ear canal and external ear normal.     Left Ear: Tympanic membrane, ear canal and external ear normal.     Nose: Nose normal. No congestion or rhinorrhea.  Eyes:     General: No scleral icterus.       Right eye: No discharge.        Left eye: No discharge.     Conjunctiva/sclera: Conjunctivae normal.     Pupils: Pupils are equal, round, and reactive to light.  Neck:     Thyroid: No thyromegaly.     Vascular: No JVD.     Trachea: No tracheal deviation.  Cardiovascular:     Rate and Rhythm: Normal rate and regular rhythm.     Heart sounds: Normal heart sounds. No murmur heard.    No friction rub. No gallop.  Pulmonary:     Effort: No respiratory distress.     Breath sounds: Normal breath sounds. No wheezing, rhonchi or rales.  Abdominal:     General: Bowel sounds are normal.     Palpations: Abdomen is soft. There is no mass.     Tenderness: There is no abdominal tenderness. There is no guarding or rebound.  Musculoskeletal:        General: No tenderness. Normal range of motion.     Cervical back: Normal range of motion and neck supple.  Lymphadenopathy:     Cervical: No cervical adenopathy.  Skin:    General: Skin is warm.     Findings: No rash.   Neurological:     Mental Status: He is alert and oriented to person, place, and time.     Cranial Nerves: No cranial nerve deficit.     Deep Tendon Reflexes: Reflexes are normal and symmetric.     Wt Readings from Last 3 Encounters:  01/29/23 216 lb (98 kg)  07/29/22 208 lb (94.3 kg)  03/18/22 213 lb (96.6 kg)    BP 122/76   Pulse 90   Ht 5\' 10"  (1.778 m)   Wt 216 lb (98 kg)   SpO2 95%   BMI 30.99 kg/m   Assessment and Plan: 1. Essential hypertension Chronic.  Controlled.  Stable.  Blood pressure 122/76.  Asymptomatic.  Tolerating medication well.  Continue hydrochlorothiazide 12.5 mg once a day lisinopril 20 mg once a day and metoprolol 50 mg 1 twice a day.  Renal function panel for recheck and will recheck in 6 months. - hydrochlorothiazide (MICROZIDE) 12.5 MG capsule; Take 1 capsule (12.5 mg total) by mouth daily.  Dispense: 90 capsule; Refill: 1 - lisinopril (ZESTRIL) 20 MG tablet; Take 1 tablet (20 mg total) by mouth daily.  Dispense: 90 tablet; Refill: 1 - metoprolol tartrate (LOPRESSOR) 50 MG tablet; Take 1 tablet (50 mg total) by mouth 2 (two) times daily.  Dispense: 180 tablet; Refill: 1 - Renal Function Panel  2. Peripheral neuropathy due to chemotherapy (HCC) Chronic.  Controlled.  Stable.  Pregabapentin is controlling postchemotherapy neuropathy well and will continue at current dosing of Lyrica 100 mg twice a day. - pregabalin (LYRICA) 100 MG capsule; Take 1 capsule (100 mg total) by mouth 2 (two) times daily.  Dispense: 60 capsule; Refill: 5  3. Other male erectile dysfunction Chronic.  Controlled.  Stable.  Continue sildenafil 20 mg as  needed with excellent results. - sildenafil (REVATIO) 20 MG tablet; TAKE 1 TABLET BY MOUTH AS NEEDED BEFORE SEXUAL ACTIVITY  Dispense: 60 tablet; Refill: 0  4. Fever blister Chronic.  Episodic.  Breakthrough HPV controlled with current dosing of valacyclovir 1 g twice a day day of outbreak and 1 daily thereafter. - valACYclovir  (VALTREX) 1000 MG tablet; Take 2 tablets (2,000 mg total) by mouth daily. 2 tablets today and 2 tomorrow.  Dispense: 10 tablet; Refill: 1     Elizabeth Sauer, MD

## 2023-01-30 ENCOUNTER — Encounter: Payer: Self-pay | Admitting: Family Medicine

## 2023-01-30 LAB — RENAL FUNCTION PANEL
Albumin: 4.5 g/dL (ref 3.9–4.9)
BUN/Creatinine Ratio: 19 (ref 10–24)
BUN: 22 mg/dL (ref 8–27)
CO2: 23 mmol/L (ref 20–29)
Calcium: 9.8 mg/dL (ref 8.6–10.2)
Chloride: 103 mmol/L (ref 96–106)
Creatinine, Ser: 1.13 mg/dL (ref 0.76–1.27)
Glucose: 107 mg/dL — ABNORMAL HIGH (ref 70–99)
Phosphorus: 3.7 mg/dL (ref 2.8–4.1)
Potassium: 4.5 mmol/L (ref 3.5–5.2)
Sodium: 141 mmol/L (ref 134–144)
eGFR: 72 mL/min/{1.73_m2} (ref >59)

## 2023-02-02 ENCOUNTER — Other Ambulatory Visit: Payer: Self-pay | Admitting: Family Medicine

## 2023-02-02 DIAGNOSIS — M25551 Pain in right hip: Secondary | ICD-10-CM

## 2023-02-03 NOTE — Telephone Encounter (Signed)
Requested medication (s) are due for refill today:   Yes  Requested medication (s) are on the active medication list:   Yes  Future visit scheduled:   No    Seen 5 days ago   Last ordered: 10/30/2022 #180, 0 refills  Unable to refill because H & H and platelets due per protocol.     Requested Prescriptions  Pending Prescriptions Disp Refills   diclofenac (VOLTAREN) 75 MG EC tablet [Pharmacy Med Name: DICLOFENAC SOD EC 75 MG TAB] 180 tablet 0    Sig: TAKE 1 TABLET BY MOUTH TWICE A DAY AS NEEDED     Analgesics:  NSAIDS Failed - 02/02/2023  8:59 AM      Failed - Manual Review: Labs are only required if the patient has taken medication for more than 8 weeks.      Failed - HGB in normal range and within 360 days    Hemoglobin  Date Value Ref Range Status  05/05/2019 13.3 (A) 13.5 - 17.5 Final         Failed - PLT in normal range and within 360 days    Platelets  Date Value Ref Range Status  05/05/2019 191 150 - 399 Final         Failed - HCT in normal range and within 360 days    HCT  Date Value Ref Range Status  05/05/2019 39 (A) 41 - 53 Final         Passed - Cr in normal range and within 360 days    Creatinine, Ser  Date Value Ref Range Status  01/29/2023 1.13 0.76 - 1.27 mg/dL Final         Passed - eGFR is 30 or above and within 360 days    GFR calc Af Amer  Date Value Ref Range Status  01/06/2020 78 >59 mL/min/1.73 Final    Comment:    **[facility] currently reports eGFR in compliance with the current**   recommendations of the [employer]. [facility] will   update reporting as new guidelines are published from the NKF-ASN   Task force.    GFR calc non Af Amer  Date Value Ref Range Status  01/06/2020 68 >59 mL/min/1.73 Final   eGFR  Date Value Ref Range Status  01/29/2023 72 >59 mL/min/1.73 Final         Passed - Patient is not pregnant      Passed - Valid encounter within last 12 months    Recent Outpatient Visits           5 days ago  Essential hypertension   [place] [facility]   6 months ago Essential hypertension   [place] [facility]   10 months ago Primary osteoarthritis of right hip   [facility] at [facility], Ocie Bob, MD   11 months ago Arthralgia of right hip   [facility] at [facility], Ocie Bob, MD   1 year ago Essential hypertension   [facility]

## 2023-02-19 DIAGNOSIS — G4733 Obstructive sleep apnea (adult) (pediatric): Secondary | ICD-10-CM | POA: Diagnosis not present

## 2023-03-10 ENCOUNTER — Other Ambulatory Visit: Payer: Self-pay | Admitting: Family Medicine

## 2023-03-10 DIAGNOSIS — M25551 Pain in right hip: Secondary | ICD-10-CM

## 2023-03-12 NOTE — Telephone Encounter (Signed)
Please review.  KP

## 2023-03-12 NOTE — Telephone Encounter (Signed)
Requested medication (s) are due for refill today: yes  Requested medication (s) are on the active medication list: yes  Last refill:  02/04/23 #60  Future visit scheduled: no  Notes to clinic:  overdue lab work    Requested Prescriptions  Pending Prescriptions Disp Refills   diclofenac (VOLTAREN) 75 MG EC tablet [Pharmacy Med Name: DICLOFENAC SOD EC 75 MG TAB] 60 tablet 0    Sig: TAKE 1 TABLET BY MOUTH TWICE A DAY AS NEEDED     Analgesics:  NSAIDS Failed - 03/10/2023  6:28 AM      Failed - Manual Review: Labs are only required if the patient has taken medication for more than 8 weeks.      Failed - HGB in normal range and within 360 days    Hemoglobin  Date Value Ref Range Status  05/05/2019 13.3 (A) 13.5 - 17.5 Final         Failed - PLT in normal range and within 360 days    Platelets  Date Value Ref Range Status  05/05/2019 191 150 - 399 Final         Failed - HCT in normal range and within 360 days    HCT  Date Value Ref Range Status  05/05/2019 39 (A) 41 - 53 Final         Passed - Cr in normal range and within 360 days    Creatinine, Ser  Date Value Ref Range Status  01/29/2023 1.13 0.76 - 1.27 mg/dL Final         Passed - eGFR is 30 or above and within 360 days    GFR calc Af Amer  Date Value Ref Range Status  01/06/2020 78 >59 mL/min/1.73 Final    Comment:    **[facility] currently reports eGFR in compliance with the current**   recommendations of the [employer]. [facility] will   update reporting as new guidelines are published from the NKF-ASN   Task force.    GFR calc non Af Amer  Date Value Ref Range Status  01/06/2020 68 >59 mL/min/1.73 Final   eGFR  Date Value Ref Range Status  01/29/2023 72 >59 mL/min/1.73 Final         Passed - Patient is not pregnant      Passed - Valid encounter within last 12 months    Recent Outpatient Visits           1 month ago Essential hypertension   [place] [facility]   7 months ago Essential hypertension   [place] [facility]   11 months ago Primary osteoarthritis of right hip   [facility], Ocie Bob, MD   1 year ago Arthralgia of right hip   [facility], Ocie Bob, MD   1 year ago Essential hypertension   [facility]

## 2023-03-21 DIAGNOSIS — G4733 Obstructive sleep apnea (adult) (pediatric): Secondary | ICD-10-CM | POA: Diagnosis not present

## 2023-03-30 DIAGNOSIS — E1159 Type 2 diabetes mellitus with other circulatory complications: Secondary | ICD-10-CM | POA: Diagnosis not present

## 2023-03-30 DIAGNOSIS — Z79899 Other long term (current) drug therapy: Secondary | ICD-10-CM | POA: Diagnosis not present

## 2023-03-30 DIAGNOSIS — E785 Hyperlipidemia, unspecified: Secondary | ICD-10-CM | POA: Diagnosis not present

## 2023-03-30 DIAGNOSIS — I152 Hypertension secondary to endocrine disorders: Secondary | ICD-10-CM | POA: Diagnosis not present

## 2023-03-30 DIAGNOSIS — E119 Type 2 diabetes mellitus without complications: Secondary | ICD-10-CM | POA: Diagnosis not present

## 2023-03-30 DIAGNOSIS — E1169 Type 2 diabetes mellitus with other specified complication: Secondary | ICD-10-CM | POA: Diagnosis not present

## 2023-04-02 ENCOUNTER — Encounter: Payer: Self-pay | Admitting: Family Medicine

## 2023-04-02 ENCOUNTER — Ambulatory Visit (INDEPENDENT_AMBULATORY_CARE_PROVIDER_SITE_OTHER): Payer: BC Managed Care – PPO | Admitting: Family Medicine

## 2023-04-02 VITALS — BP 138/90 | HR 89 | Ht 70.0 in | Wt 221.8 lb

## 2023-04-02 DIAGNOSIS — M1611 Unilateral primary osteoarthritis, right hip: Secondary | ICD-10-CM

## 2023-04-02 MED ORDER — TRIAZOLAM 0.125 MG PO TABS: ORAL_TABLET | ORAL | 0 refills | Status: AC

## 2023-04-02 MED ORDER — HYDROCODONE-ACETAMINOPHEN 5-325 MG PO TABS: ORAL_TABLET | ORAL | 0 refills | Status: AC

## 2023-04-02 NOTE — Progress Notes (Signed)
Primary Care / Sports Medicine Office Visit  Patient Information:  Patient ID: Jerry Harmon, male DOB: 12-Apr-1955 Age: 67 y.o. MRN: [id number]   Jerry Harmon is a pleasant 67 y.o. male presenting with the following:  Chief Complaint  Patient presents with   Knee Pain    Knee pain has gotten worse since he seen Dr. Ashley Royalty last year. His knee pain is not constant. Pain is achy, sometimes it can be sharpe. No injection therapy or PT.     Hip Pain    Hip has pain going from sitting to stand kind of popping. Sometimes has pain is sharpe. No injection therapy or PT.     Vitals:   04/02/23 0802  BP: (!) 138/90  Pulse: 89  SpO2: 98%   Vitals:   04/02/23 0802  Weight: 221 lb 12.8 oz (100.6 kg)  Height: 5\' 10"  (1.778 m)   Body mass index is 31.82 kg/m.  No results found.   Independent interpretation of notes and tests performed by another provider:   None  Procedures performed:   None  Pertinent History, Exam, Impression, and Recommendations:   Problem List Items Addressed This Visit       Musculoskeletal and Integument   Osteoarthritis of right hip - Primary   History of Present Illness The patient, a 67 year old individual with a history of diabetes and right hip osteoarthritis, presents with ongoing pain in the right hip. The pain has been persistent over the past year, with the patient describing it as a shifting sensation when getting up from a seated position. The pain has been worsening over the past few months, causing discomfort during walking and leading to a favoring gait. The patient also reports associated pain in the right knee, which he believes may be due to favoring the right hip.  The patient has been managing the pain with diclofenac, taken twice daily. However, the medication has not provided complete relief and the patient has been supplementing with over-the-counter Aleve and arthritis-strength Tylenol for breakthrough pain. We reviewed the  potential risks of long-term diclofenac use, particularly in regards to renal function given comorbid diabetes.  Physical Exam MUSCULOSKELETAL: Positive FADIR test on the right. No tenderness upon palpation of the patellar facets of the right knee.  Nontender medial and lateral joint lines of the right knee. Painless range of motion from about zero to 125 degrees on the right knee.  RADIOLOGY X-ray of the right hip: Moderate osteoarthritis with sclerosis (03/04/2022)  Assessment and Plan Right Hip Osteoarthritis -exacerbation of chronic condition Moderate arthritis on x-ray causing significant daily pain and affecting quality of life. Pain in the right knee likely secondary and considered compensatory to ipsilateral hip arthritis. Current management with Diclofenac twice daily is providing incomplete relief and is a suboptimal long-term solution given comorbid diabetes.  We reviewed the short-term blood sugar increased from intra-articular corticosteroid risks versus benefits to daily long-term NSAIDs.  We extensively reviewed both surgical and nonsurgical treatment options, the natural course of osteoarthritis, and next steps given treatments thus far.  Shared medical decision making conducted with patient. -Plan for cortisone injection under ultrasound guidance into the right hip to provide localized anti-inflammatory effect and potentially reduce need for regular oral NSAIDs. -Both triazolam and hydrocodone-APAP 1 tab each PO prescribed for pre-procedure anxiolytic and analgesic.  Patient will come with driver. -Recommend home-based exercises to improve hip mobility and function once pain is controlled. -Consider surgical intervention if conservative measures fail to provide adequate  relief.  Medication Management Current use of Diclofenac twice daily and Aleve for arthritis pain. Concern for potential renal effects with long-term use. -Advise to use Tylenol as first-line for any pain to reduce  NSAID use. -Continue Diclofenac as needed after cortisone injection, ideally as an adjunct to home-based physical therapy, with the goal of reducing frequency of use.  Follow-up Schedule a procedure slot for cortisone injection. Check response to injection and reassess need for Diclofenac at follow-up visit.        Orders & Medications Medications: No orders of the defined types were placed in this encounter.  No orders of the defined types were placed in this encounter.    No follow-ups on file.     Jerrol Banana, MD, [facility]   Primary Care Sports Medicine [facility] at [facility]

## 2023-04-02 NOTE — Assessment & Plan Note (Addendum)
History of Present Illness The patient, a 67 year old individual with a history of diabetes and right hip osteoarthritis, presents with ongoing pain in the right hip. The pain has been persistent over the past year, with the patient describing it as a shifting sensation when getting up from a seated position. The pain has been worsening over the past few months, causing discomfort during walking and leading to a favoring gait. The patient also reports associated pain in the right knee, which he believes may be due to favoring the right hip.  The patient has been managing the pain with diclofenac, taken twice daily. However, the medication has not provided complete relief and the patient has been supplementing with over-the-counter Aleve and arthritis-strength Tylenol for breakthrough pain. We reviewed the potential risks of long-term diclofenac use, particularly in regards to renal function given comorbid diabetes.  Physical Exam MUSCULOSKELETAL: Positive FADIR test on the right. No tenderness upon palpation of the patellar facets of the right knee.  Nontender medial and lateral joint lines of the right knee. Painless range of motion from about zero to 125 degrees on the right knee.  RADIOLOGY X-ray of the right hip: Moderate osteoarthritis with sclerosis (03/04/2022)  Assessment and Plan Right Hip Osteoarthritis -exacerbation of chronic condition Moderate arthritis on x-ray causing significant daily pain and affecting quality of life. Pain in the right knee likely secondary and considered compensatory to ipsilateral hip arthritis. Current management with Diclofenac twice daily is providing incomplete relief and is a suboptimal long-term solution given comorbid diabetes.  We reviewed the short-term blood sugar increased from intra-articular corticosteroid risks versus benefits to daily long-term NSAIDs.  We extensively reviewed both surgical and nonsurgical treatment options, the natural course of  osteoarthritis, and next steps given treatments thus far.  Shared medical decision making conducted with patient. -Plan for cortisone injection under ultrasound guidance into the right hip to provide localized anti-inflammatory effect and potentially reduce need for regular oral NSAIDs. -Both triazolam and hydrocodone-APAP 1 tab each PO prescribed for pre-procedure anxiolytic and analgesic.  Patient will come with driver. -Recommend home-based exercises to improve hip mobility and function once pain is controlled. -Consider surgical intervention if conservative measures fail to provide adequate relief.  Medication Management Current use of Diclofenac twice daily and Aleve for arthritis pain. Concern for potential renal effects with long-term use. -Advise to use Tylenol as first-line for any pain to reduce NSAID use. -Continue Diclofenac as needed after cortisone injection, ideally as an adjunct to home-based physical therapy, with the goal of reducing frequency of use.  Follow-up Schedule a procedure slot for cortisone injection. Check response to injection and reassess need for Diclofenac at follow-up visit.

## 2023-04-02 NOTE — Patient Instructions (Addendum)
-   Return for scheduled cortisone (steroid) injection for right hip - Take pre-procedure medications as prescribed and come with a driver - Contact for any questions

## 2023-04-07 ENCOUNTER — Other Ambulatory Visit (INDEPENDENT_AMBULATORY_CARE_PROVIDER_SITE_OTHER): Payer: BC Managed Care – PPO | Admitting: Radiology

## 2023-04-07 ENCOUNTER — Ambulatory Visit (INDEPENDENT_AMBULATORY_CARE_PROVIDER_SITE_OTHER): Payer: BC Managed Care – PPO | Admitting: Family Medicine

## 2023-04-07 ENCOUNTER — Encounter: Payer: Self-pay | Admitting: Family Medicine

## 2023-04-07 VITALS — BP 120/80 | HR 66 | Ht 70.0 in | Wt 217.0 lb

## 2023-04-07 DIAGNOSIS — M1611 Unilateral primary osteoarthritis, right hip: Secondary | ICD-10-CM

## 2023-04-07 MED ORDER — TRIAMCINOLONE ACETONIDE 40 MG/ML IJ SUSP
40.0000 mg | Freq: Once | INTRAMUSCULAR | Status: AC
  Administered 2023-04-07: 40 mg via INTRAMUSCULAR

## 2023-04-07 NOTE — Progress Notes (Signed)
     Primary Care / Sports Medicine Office Visit  Patient Information:  Patient ID: Jerry Harmon, male DOB: 1955-07-17 Age: 67 y.o. MRN: [id number]   Jerry Harmon is a pleasant 68 y.o. male presenting with the following:  Chief Complaint  Patient presents with   Hip Pain    Patient presents today to get a corticosteroid injection un his right hip. This will be his first hip injection.     Vitals:   04/07/23 0917  BP: 120/80  Pulse: 66  SpO2: 94%   Vitals:   04/07/23 0917  Weight: 217 lb (98.4 kg)  Height: 5' 10 (1.778 m)   Body mass index is 31.14 kg/m.  No results found.   Independent interpretation of notes and tests performed by another provider:   None  Procedures performed:   Procedure:  Injection of right hip under ultrasound guidance. Ultrasound guidance utilized for in-plane approach to the right femoroacetabular joint, no effusion noted Samsung HS60 device utilized with permanent recording / reporting. Verbal informed consent obtained and verified. Skin prepped in a sterile fashion. Ethyl chloride for topical local analgesia.  Completed without difficulty and tolerated well. Medication: triamcinolone  acetonide 40 mg/mL suspension for injection 1 mL total and 2 mL lidocaine 1% without epinephrine utilized for needle placement anesthetic Advised to contact for fevers/chills, erythema, induration, drainage, or persistent bleeding.   Pertinent History, Exam, Impression, and Recommendations:   Problem List Items Addressed This Visit       Musculoskeletal and Integument   Osteoarthritis of right hip - Primary   Patient presents for scheduled right intra-articular hip injection under ultrasound guidance.       Relevant Orders   US  LIMITED JOINT SPACE STRUCTURES LOW RIGHT     Orders & Medications Medications: No orders of the defined types were placed in this encounter.  Orders Placed This Encounter  Procedures   US  LIMITED JOINT SPACE STRUCTURES  LOW RIGHT     No follow-ups on file.     Selinda JINNY Ku, MD, [facility]   Primary Care Sports Medicine Primary Care and Sports Medicine at [facility]

## 2023-04-07 NOTE — Patient Instructions (Addendum)
 You have just been given a cortisone injection to reduce pain and inflammation. After the injection you may notice immediate relief of pain as a result of the Lidocaine. It is important to rest the area of the injection for 24 to 48 hours after the injection. There is a possibility of some temporary increased discomfort and swelling for up to 72 hours until the cortisone begins to work. If you do have pain, simply rest the joint and use ice. If you can tolerate over the counter medications, you can try Tylenol , Aleve, or Advil for added relief per package instructions. Your Plan:  - Hip Joint Pain:   - We performed a hip joint injection with numbing medication and corticosteroid to provide both immediate and delayed pain relief.   - Ice the front of your hip for 20 minutes when you get home and before bed.   - Rest for the next two days.   - Continue your pain medications as needed.   - We will check in with you in three months, or sooner if the pain returns.  - Weight Management:   - Managing your weight can help reduce pressure on your hip joint and alleviate pain.   - Continue your efforts in weight management as discussed.  - Physical Therapy:   - Regular exercise is important for maintaining joint health.   - We provided you with exercises for your hip joint and discussed the importance of performing these exercises at least once a week.

## 2023-04-07 NOTE — Assessment & Plan Note (Signed)
Patient presents for scheduled right intra-articular hip injection under ultrasound guidance.

## 2023-04-22 DIAGNOSIS — G4733 Obstructive sleep apnea (adult) (pediatric): Secondary | ICD-10-CM | POA: Diagnosis not present

## 2023-05-23 DIAGNOSIS — G4733 Obstructive sleep apnea (adult) (pediatric): Secondary | ICD-10-CM | POA: Diagnosis not present

## 2023-06-15 ENCOUNTER — Ambulatory Visit (INDEPENDENT_AMBULATORY_CARE_PROVIDER_SITE_OTHER): Admitting: Family Medicine

## 2023-06-15 ENCOUNTER — Encounter: Payer: Self-pay | Admitting: Family Medicine

## 2023-06-15 VITALS — BP 122/68 | HR 101 | Ht 70.0 in | Wt 207.0 lb

## 2023-06-15 DIAGNOSIS — J301 Allergic rhinitis due to pollen: Secondary | ICD-10-CM | POA: Diagnosis not present

## 2023-06-15 DIAGNOSIS — J0100 Acute maxillary sinusitis, unspecified: Secondary | ICD-10-CM

## 2023-06-15 MED ORDER — AMOXICILLIN 500 MG PO CAPS: 500.0000 mg | ORAL_CAPSULE | Freq: Three times a day (TID) | ORAL | 0 refills | Status: AC

## 2023-06-15 MED ORDER — FLUNISOLIDE 25 MCG/ACT (0.025%) NA SOLN: 2.0000 | Freq: Two times a day (BID) | NASAL | 2 refills | Status: DC

## 2023-06-15 NOTE — Progress Notes (Signed)
 Date:  06/15/2023   Name:  Jerry Harmon   DOB:  Sep 07, 1955   MRN:  [id number]   Chief Complaint: Cough (Cough, sore throat, itchy eyes. X 3 days.)  Cough This is a new problem. The current episode started in the past 7 days. The problem has been waxing and waning. The cough is Productive of sputum. Associated symptoms include headaches, myalgias, nasal congestion, rhinorrhea and a sore throat. Pertinent negatives include no chest pain, chills, ear congestion, ear pain, fever, heartburn, hemoptysis, postnasal drip, rash, shortness of breath, sweats or wheezing. The symptoms are aggravated by pollens.  Sinusitis This is a new problem. The current episode started in the past 7 days. The problem has been gradually worsening since onset. There has been no fever. Associated symptoms include congestion, coughing, headaches, sinus pressure, sneezing and a sore throat. Pertinent negatives include no chills, diaphoresis, ear pain, hoarse voice, neck pain, shortness of breath or swollen glands.    Lab Results  Component Value Date   NA 141 01/29/2023   K 4.5 01/29/2023   CO2 23 01/29/2023   GLUCOSE 107 (H) 01/29/2023   BUN 22 01/29/2023   CREATININE 1.13 01/29/2023   CALCIUM 9.8 01/29/2023   EGFR 72 01/29/2023   GFRNONAA 68 01/06/2020   Lab Results  Component Value Date   CHOL 122 07/29/2022   HDL 43 07/29/2022   LDLCALC 65 07/29/2022   TRIG 70 07/29/2022   CHOLHDL 3.8 01/12/2019   Lab Results  Component Value Date   TSH 2.600 03/12/2015   Lab Results  Component Value Date   HGBA1C 7.0 (H) 12/27/2020   Lab Results  Component Value Date   WBC 5.8 05/05/2019   HGB 13.3 (A) 05/05/2019   HCT 39 (A) 05/05/2019   PLT 191 05/05/2019   Lab Results  Component Value Date   ALT 16 12/27/2020   AST 14 12/27/2020   ALKPHOS 65 12/27/2020   BILITOT 0.4 12/27/2020   No results found for: "25OHVITD2", "25OHVITD3", "VD25OH"   Review of Systems  Constitutional:  Negative for chills,  diaphoresis and fever.  HENT:  Positive for congestion, rhinorrhea, sinus pressure, sneezing and sore throat. Negative for ear pain, hoarse voice and postnasal drip.   Respiratory:  Positive for cough. Negative for hemoptysis, shortness of breath and wheezing.   Cardiovascular:  Negative for chest pain.  Gastrointestinal:  Negative for heartburn.  Musculoskeletal:  Positive for myalgias. Negative for neck pain.  Skin:  Negative for rash.  Neurological:  Positive for headaches.    Patient Active Problem List   Diagnosis Date Noted   Bone cyst of left femur 03/18/2022   Osteoarthritis of right hip 03/04/2022   Closed displaced fracture of proximal phalanx of lesser toe of right foot 07/02/2021   Fever blister 07/01/2019   Pain of left hand 03/07/2019   Adjustment disorder 03/27/2016   Neuropathy 11/13/2015   Allergic rhinitis due to pollen 11/13/2015   BP (high blood pressure) 09/11/2014   Cardiomyopathy (HCC) 09/11/2014   Sleep apnea 09/11/2014   Blood pressure elevated without history of HTN 09/11/2014   Erectile dysfunction 09/11/2014   Lump in testis 09/11/2014   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  No outpatient medications have been marked as taking for the 06/15/23 encounter (Office Visit) with Duanne Limerick, MD.       06/15/2023   11:10 AM 01/29/2023    1:45 PM 07/29/2022    1:56 PM 01/27/2022    3:31 PM  GAD 7 : Generalized Anxiety Score  Nervous, Anxious, on Edge 0 0 0  0  Control/stop worrying 0 0 0 0  Worry too much - different things 0 0 0 0  Trouble relaxing 0 1 0 0  Restless 2 2 0 0  Easily annoyed or irritable 0 0 0 0  Afraid - awful might happen 0 0 0 0  Total GAD 7 Score 2 3 0 0  Anxiety Difficulty Not difficult at all Not difficult at all Not difficult at all Not difficult at all       06/15/2023   11:10 AM 01/29/2023    1:45 PM 07/29/2022    1:56 PM  Depression screen PHQ 2/9  Decreased Interest 0 0 0  Down, Depressed, Hopeless 0 0 0  PHQ - 2 Score 0 0 0  Altered sleeping 0 2 0  Tired, decreased energy 2 3 0  Change in appetite 0 0 0  Feeling bad or failure about yourself  0 0 0  Trouble concentrating 0 0 0  Moving slowly or fidgety/restless 0 2 0  Suicidal thoughts 0 0 0  PHQ-9 Score 2 7 0  Difficult doing work/chores Not difficult at all Not difficult at all Not difficult at all    BP Readings from Last 3 Encounters:  06/15/23 122/68  04/07/23 120/80  04/02/23 (!) 138/90    Physical Exam Vitals and nursing note reviewed.  Constitutional:      Appearance: He is well-developed.  HENT:     Head: Normocephalic and atraumatic.     Right Ear: Tympanic membrane, ear canal and external ear normal.     Left Ear: Tympanic membrane, ear canal and external ear normal.     Nose: Nose normal.     Mouth/Throat:     Dentition: Normal dentition.  Eyes:     General: Lids are normal. No scleral icterus.    Conjunctiva/sclera: Conjunctivae normal.     Pupils: Pupils are equal, round, and reactive to light.  Neck:     Thyroid: No thyromegaly.     Vascular: No carotid bruit, hepatojugular reflux or JVD.     Trachea: No tracheal deviation.  Cardiovascular:     Rate and Rhythm: Normal rate and regular rhythm.     Heart sounds: Normal heart sounds.  Pulmonary:     Effort: Pulmonary effort is normal.     Breath sounds: Normal breath sounds.  Abdominal:     General: Bowel sounds are normal.     Palpations: Abdomen is soft. There is no  hepatomegaly, splenomegaly or mass.     Tenderness: There is no abdominal tenderness.     Hernia: There is no hernia in the left inguinal area.  Musculoskeletal:        General: Normal range of motion.     Cervical back: Normal range of motion and neck supple.  Lymphadenopathy:  Cervical: No cervical adenopathy.  Skin:    General: Skin is warm and dry.     Findings: No rash.  Neurological:     Mental Status: He is alert and oriented to person, place, and time.     Sensory: No sensory deficit.     Deep Tendon Reflexes: Reflexes are normal and symmetric.  Psychiatric:        Mood and Affect: Mood is not anxious or depressed.     Wt Readings from Last 3 Encounters:  06/15/23 207 lb (93.9 kg)  04/07/23 217 lb (98.4 kg)  04/02/23 221 lb 12.8 oz (100.6 kg)    BP 122/68   Pulse (!) 101   Ht 5\' 10"  (1.778 m)   Wt 207 lb (93.9 kg)   SpO2 98%   BMI 29.70 kg/m   Assessment and Plan: 1. Acute maxillary sinusitis, recurrence not specified (Primary) Acute.  Persistent.  Relatively stable.  But this has been somewhat overwhelming with the pollen counts being as high as they are.  We will cover with amoxicillin 500 mg 3 times a day for 10 days and encourage low-dose Sudafed 30 mg every 12 hours/Mucinex DM for mucolytic action and cough suppression/nasal saline for lavage of the sinus passages/and nasal steroid as Nasacort AQ 2 sprays to nose twice a day.  Patient's been encouraged to wear mask when he is outdoors during the pollen season. - amoxicillin (AMOXIL) 500 MG capsule; Take 1 capsule (500 mg total) by mouth 3 (three) times daily for 10 days.  Dispense: 30 capsule; Refill: 0  2. Seasonal allergic rhinitis due to pollen Chronic.  Episodic.  On ongoing basis while we are in pollen season I have suggested patient take the Nasalide solution 2 sprays in the nose 2 times a day. - flunisolide (NASALIDE) 25 MCG/ACT (0.025%) SOLN; Place 2 sprays into the nose 2 (two) times daily.  Dispense:  25 mL; Refill: 2     Elizabeth Sauer, MD

## 2023-06-20 DIAGNOSIS — G4733 Obstructive sleep apnea (adult) (pediatric): Secondary | ICD-10-CM | POA: Diagnosis not present

## 2023-07-13 ENCOUNTER — Other Ambulatory Visit: Payer: Self-pay | Admitting: Family Medicine

## 2023-07-13 DIAGNOSIS — J301 Allergic rhinitis due to pollen: Secondary | ICD-10-CM

## 2023-07-27 DIAGNOSIS — I152 Hypertension secondary to endocrine disorders: Secondary | ICD-10-CM | POA: Diagnosis not present

## 2023-07-27 DIAGNOSIS — E1159 Type 2 diabetes mellitus with other circulatory complications: Secondary | ICD-10-CM | POA: Diagnosis not present

## 2023-07-27 DIAGNOSIS — E119 Type 2 diabetes mellitus without complications: Secondary | ICD-10-CM | POA: Diagnosis not present

## 2023-07-27 DIAGNOSIS — E1169 Type 2 diabetes mellitus with other specified complication: Secondary | ICD-10-CM | POA: Diagnosis not present

## 2023-07-28 ENCOUNTER — Other Ambulatory Visit: Payer: Self-pay | Admitting: Family Medicine

## 2023-07-28 DIAGNOSIS — G620 Drug-induced polyneuropathy: Secondary | ICD-10-CM

## 2023-08-21 ENCOUNTER — Ambulatory Visit: Admitting: Family Medicine

## 2023-08-23 ENCOUNTER — Other Ambulatory Visit: Payer: Self-pay | Admitting: Family Medicine

## 2023-08-23 DIAGNOSIS — G620 Drug-induced polyneuropathy: Secondary | ICD-10-CM

## 2023-08-24 ENCOUNTER — Ambulatory Visit: Admitting: Family Medicine

## 2023-08-24 ENCOUNTER — Encounter: Payer: Self-pay | Admitting: Family Medicine

## 2023-08-24 ENCOUNTER — Other Ambulatory Visit (INDEPENDENT_AMBULATORY_CARE_PROVIDER_SITE_OTHER): Payer: Self-pay | Admitting: Radiology

## 2023-08-24 VITALS — BP 100/62 | HR 89 | Ht 70.0 in | Wt 207.0 lb

## 2023-08-24 DIAGNOSIS — M1611 Unilateral primary osteoarthritis, right hip: Secondary | ICD-10-CM

## 2023-08-24 MED ORDER — DICLOFENAC SODIUM 75 MG PO TBEC: 75.0000 mg | DELAYED_RELEASE_TABLET | Freq: Two times a day (BID) | ORAL | 2 refills | Status: AC | PRN

## 2023-08-24 MED ORDER — TRIAMCINOLONE ACETONIDE 40 MG/ML IJ SUSP
40.0000 mg | Freq: Once | INTRAMUSCULAR | Status: AC
  Administered 2023-08-24: 40 mg via INTRAMUSCULAR

## 2023-08-24 NOTE — Assessment & Plan Note (Signed)
 History of Present Illness Jerry Harmon is a 68 year old male who presents with right hip pain for follow-up and consideration of another cortisone injection.  He has persistent right hip pain that has been ongoing since his last visit in December in the setting of underlying right hip osteoarthritis. A cortisone injection at that time provided relief for approximately two and a half months. The pain gradually returned and has been worsening over the last few weeks.  He manages the pain with diclofenac  as needed but tries to minimize its use. He has not taken Aleve since being advised against it. He wants to avoid hip replacement surgery if possible but acknowledges that it may be necessary if other treatments fail.  He is considering another cortisone injection. He is also contemplating physical therapy to potentially extend the duration of relief from the injections.  During the review of symptoms, he reports pain in the right hip, particularly when the joint is manipulated, and mild tenderness at the right greater trochanter region.  Physical Exam MEASUREMENTS: BMI- 29.7. PALPATION: Mild tenderness at the right greater trochanter. SPECIAL TESTS: Positive FADIR test on the right hip, reproducing symptoms.  Assessment and Plan Right hip osteoarthritis Chronic right hip pain with mild tenderness at the right greater trochanter. Previous cortisone injection provided relief for 2.5 months. Current pain worsened over weeks. He manages pain with diclofenac  and aims to avoid hip replacement. Physical therapy discussed to strengthen hip muscles and extend relief from injections. Hip replacement considered if injections and medications fail. BMI 29.7 acceptable for surgery. Hip replacement technology lasts ~20 years; goal to delay surgery until age 64 to avoid revision. Surgery delayed 3 months post-cortisone injection. - Administer cortisone injection to right hip joint under ultrasound guidance. -  Prescribe diclofenac  for pain management.  Does this twice daily on an as-needed basis, take with food. - Refer to physical therapy to strengthen hip muscles.  If delays in scheduling noted, contact the following number: [facility]: [place]:  [phone] - Discuss potential hip replacement if pain management fails.

## 2023-08-24 NOTE — Progress Notes (Signed)
 Primary Care / Sports Medicine Office Visit  Patient Information:  Patient ID: Jerry Harmon, male DOB: 1955/08/04 Age: 68 y.o. MRN: [id number]   Jerry Harmon is a pleasant 68 y.o. male presenting with the following:  Chief Complaint  Patient presents with   Hip Pain    Patient presents today to get right hip injection with cortisone. His last injection was 04/07/23.     Vitals:   08/24/23 1437  BP: 100/62  Pulse: 89  SpO2: 94%   Vitals:   08/24/23 1437  Weight: 207 lb (93.9 kg)  Height: 5\' 10"  (1.778 m)   Body mass index is 29.7 kg/m.  No results found.   Independent interpretation of notes and tests performed by another provider:   None  Procedures performed:   Procedure:  Injection of right hip under ultrasound guidance. Ultrasound guidance utilized for in-plane approach to right femoral head neck junction, no sonographic abnormalities noted Samsung HS60 device utilized with permanent recording / reporting. Verbal informed consent obtained and verified. Skin prepped in a sterile fashion. Ethyl chloride for topical local analgesia.  Completed without difficulty and tolerated well. Medication: triamcinolone  acetonide 40 mg/mL suspension for injection 1 mL total and 2 mL lidocaine 1% without epinephrine utilized for needle placement anesthetic Advised to contact for fevers/chills, erythema, induration, drainage, or persistent bleeding.   Pertinent History, Exam, Impression, and Recommendations:   Problem List Items Addressed This Visit     Osteoarthritis of right hip - Primary   History of Present Illness Jerry Harmon is a 68 year old male who presents with right hip pain for follow-up and consideration of another cortisone injection.  He has persistent right hip pain that has been ongoing since his last visit in December in the setting of underlying right hip osteoarthritis. A cortisone injection at that time provided relief for approximately two and a  half months. The pain gradually returned and has been worsening over the last few weeks.  He manages the pain with diclofenac  as needed but tries to minimize its use. He has not taken Aleve since being advised against it. He wants to avoid hip replacement surgery if possible but acknowledges that it may be necessary if other treatments fail.  He is considering another cortisone injection. He is also contemplating physical therapy to potentially extend the duration of relief from the injections.  During the review of symptoms, he reports pain in the right hip, particularly when the joint is manipulated, and mild tenderness at the right greater trochanter region.  Physical Exam MEASUREMENTS: BMI- 29.7. PALPATION: Mild tenderness at the right greater trochanter. SPECIAL TESTS: Positive FADIR test on the right hip, reproducing symptoms.  Assessment and Plan Right hip osteoarthritis Chronic right hip pain with mild tenderness at the right greater trochanter. Previous cortisone injection provided relief for 2.5 months. Current pain worsened over weeks. He manages pain with diclofenac  and aims to avoid hip replacement. Physical therapy discussed to strengthen hip muscles and extend relief from injections. Hip replacement considered if injections and medications fail. BMI 29.7 acceptable for surgery. Hip replacement technology lasts ~20 years; goal to delay surgery until age 13 to avoid revision. Surgery delayed 3 months post-cortisone injection. - Administer cortisone injection to right hip joint under ultrasound guidance. - Prescribe diclofenac  for pain management.  Does this twice daily on an as-needed basis, take with food. - Refer to physical therapy to strengthen hip muscles.  If delays in scheduling noted, contact the following number: [facility] Physical  Therapy: [place]:  [phone] - Discuss potential hip replacement if pain management fails.      Relevant Medications   triamcinolone  acetonide  (KENALOG -40) injection 40 mg   diclofenac  (VOLTAREN ) 75 MG EC tablet   Other Relevant Orders   US  LIMITED JOINT SPACE STRUCTURES LOW RIGHT   Ambulatory referral to Physical Therapy     Orders & Medications Medications:  Meds ordered this encounter  Medications   triamcinolone  acetonide (KENALOG -40) injection 40 mg   diclofenac  (VOLTAREN ) 75 MG EC tablet    Sig: Take 1 tablet (75 mg total) by mouth 2 (two) times daily as needed.    Dispense:  60 tablet    Refill:  2   Orders Placed This Encounter  Procedures   US  LIMITED JOINT SPACE STRUCTURES LOW RIGHT   Ambulatory referral to Physical Therapy     Return if symptoms worsen or fail to improve.     Ma Saupe, MD, [facility]   Primary Care Sports Medicine Primary Care and Sports Medicine at [facility] [place]

## 2023-08-24 NOTE — Patient Instructions (Signed)
 You have just been given a cortisone injection to reduce pain and inflammation. After the injection you may notice immediate relief of pain as a result of the Lidocaine. It is important to rest the area of the injection for 24 to 48 hours after the injection. There is a possibility of some temporary increased discomfort and swelling for up to 72 hours until the cortisone begins to work. If you do have pain, simply rest the joint and use ice. If you can tolerate over the counter medications, you can try Tylenol , Aleve, or Advil for added relief per package instructions.   Patient Plan  1. Right Hip Pain Management    - Receive a cortisone injection in the right hip joint for pain relief.    - Take diclofenac  twice daily as needed for pain. Always take with food to avoid stomach issues.    - Begin physical therapy to strengthen hip muscles. Contact [facility] in [place] at [phone] if there are scheduling delays.  2. Future Considerations    - If pain management with injections and medication is not effective, discuss the option of hip replacement surgery.  Red Flags: - If you experience increased pain, new symptoms, or any side effects from medications, contact the office immediately.

## 2023-08-27 DIAGNOSIS — G4733 Obstructive sleep apnea (adult) (pediatric): Secondary | ICD-10-CM | POA: Diagnosis not present

## 2023-08-31 ENCOUNTER — Other Ambulatory Visit: Payer: Self-pay | Admitting: Family Medicine

## 2023-08-31 DIAGNOSIS — I10 Essential (primary) hypertension: Secondary | ICD-10-CM

## 2023-09-01 ENCOUNTER — Telehealth: Payer: Self-pay

## 2023-09-01 ENCOUNTER — Other Ambulatory Visit: Payer: Self-pay

## 2023-09-01 DIAGNOSIS — I10 Essential (primary) hypertension: Secondary | ICD-10-CM

## 2023-09-01 MED ORDER — METOPROLOL TARTRATE 50 MG PO TABS: 50.0000 mg | ORAL_TABLET | Freq: Two times a day (BID) | ORAL | 0 refills | Status: DC

## 2023-09-01 NOTE — Telephone Encounter (Signed)
 Refilled sent.  KP  Copied from CRM [id number]. Topic: General - Other >> Sep 01, 2023 11:24 AM Emylou G wrote: Reason for CRM: Patient called said Metoprolol  Tartrate 50 mg Oral 2 times daily refill was denied.. Please advise?

## 2023-09-07 DIAGNOSIS — Z859 Personal history of malignant neoplasm, unspecified: Secondary | ICD-10-CM | POA: Diagnosis not present

## 2023-09-07 DIAGNOSIS — L578 Other skin changes due to chronic exposure to nonionizing radiation: Secondary | ICD-10-CM | POA: Diagnosis not present

## 2023-09-07 DIAGNOSIS — Z872 Personal history of diseases of the skin and subcutaneous tissue: Secondary | ICD-10-CM | POA: Diagnosis not present

## 2023-09-07 DIAGNOSIS — Z85820 Personal history of malignant melanoma of skin: Secondary | ICD-10-CM | POA: Diagnosis not present

## 2023-09-19 ENCOUNTER — Encounter: Payer: Self-pay | Admitting: Emergency Medicine

## 2023-09-19 ENCOUNTER — Ambulatory Visit
Admission: EM | Admit: 2023-09-19 | Discharge: 2023-09-19 | Disposition: A | Discharge status: Home / Self Care | Attending: Family Medicine | Admitting: Family Medicine

## 2023-09-19 DIAGNOSIS — S61216A Laceration without foreign body of right little finger without damage to nail, initial encounter: Secondary | ICD-10-CM

## 2023-09-19 NOTE — Discharge Instructions (Addendum)
 Wound clean and dry.  The glue used to close the wound will dissolve on its own over 5 days.  Do not soak saturate or scrub.  Monitor for any signs of infection.  This includes but is not limited to redness, drainage, swelling, fevers or chills and please seek reevaluation if these occur.  Follow-up with your PCP if your symptoms do not improve.  Please go to the ER for any worsening symptoms.  Hope you feel better soon!

## 2023-09-19 NOTE — ED Provider Notes (Signed)
 [facility]    CSN: [id number] Arrival date & time: 09/19/23  1407      History   Chief Complaint Chief Complaint  Patient presents with   Extremity Laceration    Right 5th finger    HPI Jerry Harmon is a 68 y.o. male presents for finger laceration.  Patient reports earlier today while cleaning his deck he cut the right fifth finger on a hook that is used to hang Christmas lights.  States he cleaned out the wound and came in for evaluation.  He reports he takes a baby aspirin only but otherwise no blood thinning medications.  He denies any numbness tingling.  He is up-to-date on his tetanus from 2019.  No other injuries or concerns at this time.  HPI  Past Medical History:  Diagnosis Date   Cancer [facility])    testicular   Erectile dysfunction    Hyperlipidemia    Hypertension     Patient Active Problem List   Diagnosis Date Noted   Bone cyst of left femur 03/18/2022   Osteoarthritis of right hip 03/04/2022   Closed displaced fracture of proximal phalanx of lesser toe of right foot 07/02/2021   Fever blister 07/01/2019   Pain of left hand 03/07/2019   Adjustment disorder 03/27/2016   Neuropathy 11/13/2015   Allergic rhinitis due to pollen 11/13/2015   BP (high blood pressure) 09/11/2014   Cardiomyopathy (HCC) 09/11/2014   Sleep apnea 09/11/2014   Blood pressure elevated without history of HTN 09/11/2014   Erectile dysfunction 09/11/2014   Lump in testis 09/11/2014   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 Authorizing Provider  allopurinol (ZYLOPRIM) 100 MG tablet Take 200 mg by mouth daily. ortho 10/07/18   [provider]  aspirin 81 MG tablet Take 1 tablet by mouth daily.    [provider]  diclofenac  (VOLTAREN ) 75 MG EC tablet Take 1 tablet (75 mg total) by mouth 2 (two) times daily as needed. 08/24/23   Matthews, Jason J, MD  flunisolide  (NASALIDE ) 25 MCG/ACT (0.025%) SOLN Place 2 sprays into the nose 2 (two) times daily. Patient not taking: Reported on 08/24/2023 06/15/23   Clarise Crooks, MD  glucose blood (ONETOUCH ULTRA) test strip Use as instructed 01/14/23   Clarise Crooks, MD  hydrochlorothiazide  (MICROZIDE ) 12.5 MG capsule Take 1 capsule (12.5 mg total) by mouth daily. 01/29/23   Clarise Crooks, MD  Lancets [facility] DELICA PLUS [place]) MISC ONE TEST DAILY 01/14/23   Clarise Crooks, MD  lisinopril  (ZESTRIL ) 20 MG tablet Take 1 tablet (20 mg total) by mouth daily. 01/29/23   Clarise Crooks, MD  loratadine  (CLARITIN ) 10 MG tablet TAKE 1 TABLET BY MOUTH EVERY DAY 07/13/23   Jones, Deanna C, MD  metoprolol  tartrate (LOPRESSOR ) 50 MG tablet Take 1 tablet (50 mg total) by mouth 2 (two) times daily. 09/01/23   Clarise Crooks, MD  Multiple Vitamins-Minerals (CENTRUM SILVER ADULT 50+ PO) Take 1 tablet by mouth daily.    [provider]  pregabalin  (LYRICA ) 100 MG capsule TAKE 1 CAPSULE BY MOUTH TWICE A DAY 08/23/23  Clarise Crooks, MD  rosuvastatin (CRESTOR) 5 MG tablet Take by mouth. 05/17/21   [provider]  Semaglutide,0.25 or 0.5MG /DOS, 2 MG/3ML SOPN Inject into the skin. 07/16/22   [provider]  sildenafil  (REVATIO ) 20 MG tablet TAKE 1 TABLET BY MOUTH AS NEEDED BEFORE SEXUAL ACTIVITY Patient not taking: Reported on 08/24/2023 01/29/23   Clarise Crooks, MD  SYNJARDY XR 12.08-998 MG TB24 Take 2 tablets by mouth daily. [place]    [provider]  triazolam  (HALCION ) 0.125 MG tablet 1 tab PO 2 hours before procedure.  Do not drive with this medication. 04/02/23 04/07/23  Ma Saupe, MD  valACYclovir  (VALTREX ) 1000 MG tablet Take 2 tablets  (2,000 mg total) by mouth daily. 2 tablets today and 2 tomorrow. Patient not taking: Reported on 08/24/2023 01/29/23   Jones, Deanna C, MD  venlafaxine (EFFEXOR) 37.5 MG tablet Take 37.5 mg by mouth.    [provider]    Family History Family History  Problem Relation Age of Onset   Heart disease Mother    Heart attack Father    Heart disease Brother     Social History Social History   Tobacco Use   Smoking status: Former   Smokeless tobacco: Never  Advertising account planner   Vaping status: Never Used  Substance Use Topics   Alcohol use: No    Alcohol/week: 0.0 standard drinks of alcohol   Drug use: No     Allergies   Patient has no known allergies.   Review of Systems Review of Systems  Skin:  Positive for wound.     Physical Exam Triage Vital Signs ED Triage Vitals  Encounter Vitals Group     BP 09/19/23 1507 118/63     Girls Systolic BP Percentile --      Girls Diastolic BP Percentile --      Boys Systolic BP Percentile --      Boys Diastolic BP Percentile --      Pulse Rate 09/19/23 1507 98     Resp 09/19/23 1507 15     Temp 09/19/23 1507 98.4 F (36.9 C)     Temp Source 09/19/23 1507 Oral     SpO2 09/19/23 1507 96 %     Weight 09/19/23 1505 207 lb 0.2 oz (93.9 kg)     Height 09/19/23 1505 5' 10 (1.778 m)     Head Circumference --      Peak Flow --      Pain Score 09/19/23 1505 3     Pain Loc --      Pain Education --      Exclude from Growth Chart --    No data found.  Updated Vital Signs BP 118/63 (BP Location: Left Arm)   Pulse 98   Temp 98.4 F (36.9 C) (Oral)   Resp 15   Ht 5' 10 (1.778 m)   Wt 207 lb 0.2 oz (93.9 kg)   SpO2 96%   BMI 29.70 kg/m   Visual Acuity Right Eye Distance:   Left Eye Distance:   Bilateral Distance:    Right Eye Near:   Left Eye Near:    Bilateral Near:     Physical Exam Vitals reviewed.  Constitutional:      General: He is not in acute distress.    Appearance: Normal appearance. He is not  ill-appearing.  HENT:     Head: Normocephalic and atraumatic.   Eyes:     Pupils: Pupils are equal,  round, and reactive to light.    Cardiovascular:     Rate and Rhythm: Normal rate.  Pulmonary:     Effort: Pulmonary effort is normal.   Musculoskeletal:       Hands:     Comments: There is a 1.2 cm linear well-approximated nonbleeding laceration to the lateral aspect of the right distal fifth finger.  There is no nail involvement and laceration does not extend to the joint.  Cap refill is +2.   Skin:    General: Skin is warm and dry.   Neurological:     General: No focal deficit present.     Mental Status: He is alert and oriented to person, place, and time.   Psychiatric:        Mood and Affect: Mood normal.        Behavior: Behavior normal.      UC Treatments / Results  Labs (all labs ordered are listed, but only abnormal results are displayed) Labs Reviewed - No data to display  EKG   Radiology No results found.  Procedures Laceration Repair  Date/Time: 09/19/2023 3:40 PM  Performed by: Alleen Arbour, NP Authorized by: Alleen Arbour, NP   Consent:    Consent obtained:  Verbal   Consent given by:  Patient   Risks discussed:  Infection, need for additional repair, pain, poor cosmetic result, poor wound healing, retained foreign body, tendon damage and vascular damage   Alternatives discussed:  Referral and observation Universal protocol:    Patient identity confirmed:  Verbally with patient Anesthesia:    Anesthesia method:  None Laceration details:    Location:  Finger   Finger location:  R small finger   Length (cm):  1.2   Depth (mm):  0.1 Pre-procedure details:    Preparation:  Patient was prepped and draped in usual sterile fashion Exploration:    Hemostasis obtained with: Nonbleeding.   Wound exploration: wound explored through full range of motion     Contaminated: no   Treatment:    Area cleansed with:  Chlorhexidine   Amount of cleaning:   Standard   Irrigation method:  Syringe   Debridement:  None Skin repair:    Repair method:  Tissue adhesive Approximation:    Approximation:  Close Repair type:    Repair type:  Simple Post-procedure details:    Dressing:  Non-adherent dressing   Procedure completion:  Tolerated well, no immediate complications  (including critical care time)  Medications Ordered in UC Medications - No data to display  Initial Impression / Assessment and Plan / UC Course  I have reviewed the triage vital signs and the nursing notes.  Pertinent labs & imaging results that were available during my care of the patient were reviewed by me and considered in my medical decision making (see chart for details).     Reviewed exam and symptoms with patient.  No red flags.  Wound closed with Dermabond.  Wound care and signs and symptoms of infection reviewed.  Patient to follow-up with PCP as needed.  ER precautions reviewed. Final Clinical Impressions(s) / UC Diagnoses   Final diagnoses:  Laceration of right little finger without foreign body without damage to nail, initial encounter   Discharge Instructions   None    ED Prescriptions   None    PDMP not reviewed this encounter.   Alleen Arbour, NP 09/19/23 [phone]

## 2023-09-19 NOTE — ED Triage Notes (Signed)
 Patient states that he was cleaning one of the lights on the deck and cut his right 5th finger on the hook today.  Patient has laceration to his right 5th finger.

## 2023-09-27 DIAGNOSIS — G4733 Obstructive sleep apnea (adult) (pediatric): Secondary | ICD-10-CM | POA: Diagnosis not present

## 2023-09-29 ENCOUNTER — Other Ambulatory Visit: Payer: Self-pay

## 2023-09-29 DIAGNOSIS — I10 Essential (primary) hypertension: Secondary | ICD-10-CM

## 2023-09-29 MED ORDER — HYDROCHLOROTHIAZIDE 12.5 MG PO CAPS: 12.5000 mg | ORAL_CAPSULE | Freq: Every day | ORAL | 1 refills | Status: DC

## 2023-09-29 MED ORDER — LISINOPRIL 20 MG PO TABS: 20.0000 mg | ORAL_TABLET | Freq: Every day | ORAL | 1 refills | Status: DC

## 2023-10-02 ENCOUNTER — Other Ambulatory Visit: Payer: Self-pay | Admitting: Family Medicine

## 2023-10-02 DIAGNOSIS — T451X5A Adverse effect of antineoplastic and immunosuppressive drugs, initial encounter: Secondary | ICD-10-CM

## 2023-10-02 DIAGNOSIS — I10 Essential (primary) hypertension: Secondary | ICD-10-CM

## 2023-10-02 NOTE — Telephone Encounter (Unsigned)
 Copied from CRM [id number]. Topic: Clinical - Medication Refill >> Oct 02, 2023  9:14 AM Silvana PARAS wrote: Medication: pregabalin  (LYRICA ) 100 MG capsule, hydrochlorothiazide  (MICROZIDE ) 12.5 MG capsule, lisinopril  (ZESTRIL ) 20 MG tablet,   Has the patient contacted their pharmacy? Yes (Agent: If no, request that the patient contact the pharmacy for the refill. If patient does not wish to contact the pharmacy document the reason why and proceed with request.) (Agent: If yes, when and what did the pharmacy advise?)  This is the patient's preferred pharmacy:  [phone] [address], [place] - [address] [place] [address] Phone: [phone] Fax: [phone]    Is this the correct pharmacy for this prescription? Yes If no, delete pharmacy and type the correct one.   Has the prescription been filled recently? Yes  Is the patient out of the medication? Yes  Has the patient been seen for an appointment in the last year OR does the patient have an upcoming appointment? Yes  Can we respond through MyChart? Yes  Agent: Please be advised that Rx refills may take up to 3 business days. We ask that you follow-up with your pharmacy.

## 2023-10-05 ENCOUNTER — Telehealth: Payer: Self-pay

## 2023-10-05 MED ORDER — PREGABALIN 100 MG PO CAPS: 100.0000 mg | ORAL_CAPSULE | Freq: Two times a day (BID) | ORAL | 0 refills | Status: DC

## 2023-10-05 MED ORDER — HYDROCHLOROTHIAZIDE 12.5 MG PO CAPS: 12.5000 mg | ORAL_CAPSULE | Freq: Every day | ORAL | 1 refills | Status: DC

## 2023-10-05 MED ORDER — LISINOPRIL 20 MG PO TABS
20.0000 mg | ORAL_TABLET | Freq: Every day | ORAL | 1 refills | Status: DC
Start: 2023-10-05 — End: 2023-11-30

## 2023-10-05 NOTE — Telephone Encounter (Signed)
Please review medication refill  request

## 2023-10-05 NOTE — Telephone Encounter (Signed)
 Requested medication (s) are due for refill today: yes  Requested medication (s) are on the active medication list: yes  Last refill:  08/23/23  Future visit scheduled: no  Notes to clinic:  Unable to refill per protocol, cannot delegate.      Requested Prescriptions  Pending Prescriptions Disp Refills   pregabalin  (LYRICA ) 100 MG capsule 60 capsule 0    Sig: Take 1 capsule (100 mg total) by mouth 2 (two) times daily.     Not Delegated - Neurology:  Anticonvulsants - Controlled - pregabalin  Failed - 10/05/2023 10:53 AM      Failed - This refill cannot be delegated      Failed - Valid encounter within last 12 months    Recent Outpatient Visits           1 month ago Primary osteoarthritis of right hip   [place] [facility], Selinda PARAS, MD   3 months ago Acute maxillary sinusitis, recurrence not specified   [place] [facility] at [facility], MD              Passed - Cr in normal range and within 360 days    Creatinine, Ser  Date Value Ref Range Status  01/29/2023 1.13 0.76 - 1.27 mg/dL Final         Passed - Completed PHQ-2 or PHQ-9 in the last 360 days       hydrochlorothiazide  (MICROZIDE ) 12.5 MG capsule 30 capsule 1    Sig: Take 1 capsule (12.5 mg total) by mouth daily.     Cardiovascular: Diuretics - Thiazide Failed - 10/05/2023 10:53 AM      Failed - Cr in normal range and within 180 days    Creatinine, Ser  Date Value Ref Range Status  01/29/2023 1.13 0.76 - 1.27 mg/dL Final         Failed - K in normal range and within 180 days    Potassium  Date Value Ref Range Status  01/29/2023 4.5 3.5 - 5.2 mmol/L Final         Failed - Na in normal range and within 180 days    Sodium  Date Value Ref Range Status  01/29/2023 141 134 - 144 mmol/L Final         Failed - Valid encounter within last 6 months    Recent Outpatient Visits           1 month ago Primary  osteoarthritis of right hip   [place] [facility], Selinda PARAS, MD   3 months ago Acute maxillary sinusitis, recurrence not specified   [place] [facility] at [facility], MD              Passed - Last BP in normal range    BP Readings from Last 1 Encounters:  09/19/23 118/63          lisinopril  (ZESTRIL ) 20 MG tablet 30 tablet 1    Sig: Take 1 tablet (20 mg total) by mouth daily.     Cardiovascular:  ACE Inhibitors Failed - 10/05/2023 10:53 AM      Failed - Cr in normal range and within 180 days    Creatinine, Ser  Date Value Ref Range Status  01/29/2023 1.13 0.76 - 1.27 mg/dL Final         Failed -  K in normal range and within 180 days    Potassium  Date Value Ref Range Status  01/29/2023 4.5 3.5 - 5.2 mmol/L Final         Failed - Valid encounter within last 6 months    Recent Outpatient Visits           1 month ago Primary osteoarthritis of right hip   [place] [facility], Selinda PARAS, MD   3 months ago Acute maxillary sinusitis, recurrence not specified   [facility] at [facility], MD              Passed - Patient is not pregnant      Passed - Last BP in normal range    BP Readings from Last 1 Encounters:  09/19/23 118/63

## 2023-10-05 NOTE — Telephone Encounter (Signed)
 Copied from CRM [id number]. Topic: General - Other >> Oct 05, 2023 10:12 AM Travis F wrote: Reason for CRM: Patient is calling in returning a call from the office. Please follow up with patient. Patient received 2 calls Friday with no voicemail. Patient believes it was regarding his medicine.

## 2023-10-05 NOTE — Telephone Encounter (Signed)
 Pt needed to schedule appt with new doctor. Pt scheduled with Dr Sol now, no need to discuss needing appt further.

## 2023-11-02 ENCOUNTER — Other Ambulatory Visit: Payer: Self-pay | Admitting: Family Medicine

## 2023-11-02 DIAGNOSIS — G620 Drug-induced polyneuropathy: Secondary | ICD-10-CM

## 2023-11-03 NOTE — Telephone Encounter (Signed)
 Requested medications are due for refill today.  yes  Requested medications are on the active medications list.  yes  Last refill. 10/05/2023 #60 0 rf  Future visit scheduled.   yes  Notes to clinic.  Refill not delegated    Requested Prescriptions  Pending Prescriptions Disp Refills   pregabalin  (LYRICA ) 100 MG capsule [Pharmacy Med Name: PREGABALIN  100 MG CAPSULE] 60 capsule 0    Sig: TAKE 1 CAPSULE BY MOUTH TWICE A DAY     Not Delegated - Neurology:  Anticonvulsants - Controlled - pregabalin  Failed - 11/03/2023  3:55 PM      Failed - This refill cannot be delegated      Failed - Valid encounter within last 12 months    Recent Outpatient Visits           2 months ago Primary osteoarthritis of right hip   [place] [facility], Selinda PARAS, MD   4 months ago Acute maxillary sinusitis, recurrence not specified   [place] [facility] at [facility], MD              Passed - Cr in normal range and within 360 days    Creatinine, Ser  Date Value Ref Range Status  01/29/2023 1.13 0.76 - 1.27 mg/dL Final         Passed - Completed PHQ-2 or PHQ-9 in the last 360 days

## 2023-11-17 ENCOUNTER — Ambulatory Visit: Admitting: Family Medicine

## 2023-11-17 VITALS — BP 92/60 | HR 99 | Ht 70.0 in | Wt 206.0 lb

## 2023-11-17 DIAGNOSIS — G4730 Sleep apnea, unspecified: Secondary | ICD-10-CM | POA: Diagnosis not present

## 2023-11-17 DIAGNOSIS — Z7985 Long-term (current) use of injectable non-insulin antidiabetic drugs: Secondary | ICD-10-CM | POA: Insufficient documentation

## 2023-11-17 DIAGNOSIS — R5383 Other fatigue: Secondary | ICD-10-CM

## 2023-11-17 DIAGNOSIS — R972 Elevated prostate specific antigen [PSA]: Secondary | ICD-10-CM

## 2023-11-17 DIAGNOSIS — R000 Tachycardia, unspecified: Secondary | ICD-10-CM

## 2023-11-17 DIAGNOSIS — I10 Essential (primary) hypertension: Secondary | ICD-10-CM | POA: Diagnosis not present

## 2023-11-17 DIAGNOSIS — T451X5A Adverse effect of antineoplastic and immunosuppressive drugs, initial encounter: Secondary | ICD-10-CM

## 2023-11-17 DIAGNOSIS — G620 Drug-induced polyneuropathy: Secondary | ICD-10-CM | POA: Insufficient documentation

## 2023-11-17 DIAGNOSIS — R9431 Abnormal electrocardiogram [ECG] [EKG]: Secondary | ICD-10-CM

## 2023-11-17 DIAGNOSIS — E785 Hyperlipidemia, unspecified: Secondary | ICD-10-CM | POA: Insufficient documentation

## 2023-11-17 NOTE — Assessment & Plan Note (Signed)
 Lab Results  Component Value Date   HGBA1C 7.0 (H) 12/27/2020   A1C : 6.4  Follows Endo. On Ozempic, Synjardy

## 2023-11-17 NOTE — Assessment & Plan Note (Signed)
 On cpap, needs sleep study

## 2023-11-17 NOTE — Patient Instructions (Addendum)
 Please stop talking hydrochlorothiazide , continue other medicines.  Make sure hydrate well, compression stockings.  Sleep study referral placed.

## 2023-11-17 NOTE — Progress Notes (Signed)
 Established Patient Office Visit  Subjective   Patient ID: Jerry Harmon, male    DOB: Jul 21, 1955  Age: 68 y.o. MRN: [id number]  Chief Complaint  Patient presents with   Hypertension   Fatigue    X6 months, And sleepy all the time, did sleep study in the past 8 years, uses CPAP at night     Assessment & Plan:   Problem List Items Addressed This Visit       Cardiovascular and Mediastinum   Essential hypertension   Pts BP is in lower range, due to fatigue and lightheaded feeling, will DC hydrochlorothiazide , Continue other BP medications. Encouraged hydration and compression stockings. Tachycardia on exam, pt asymptomatic. In office EKG was abnormal, placed cardiology referral.  He is not aware of cardiomyopathy diagnosis, no ECHO on file.       Relevant Orders   Comprehensive Metabolic Panel (CMET)     Respiratory   Sleep apnea   On cpap, needs sleep study      Relevant Orders   Ambulatory referral to Sleep Studies     Nervous and Auditory   Peripheral neuropathy due to chemotherapy [facility])     Other   Dyslipidemia   Relevant Orders   Lipid panel   Long-term current use of injectable noninsulin antidiabetic medication   Lab Results  Component Value Date   HGBA1C 7.0 (H) 12/27/2020   A1C : 6.4  Follows Endo. On Ozempic, Synjardy       Other Visit Diagnoses       Tachycardia    -  Primary   Relevant Orders   EKG 12-Lead (Completed)   Ambulatory referral to Cardiology     Other fatigue       Relevant Orders   CBC with Differential   TSH     Elevated PSA       Relevant Orders   PSA     Abnormal EKG       Relevant Orders   Ambulatory referral to Cardiology      BP Readings from Last 3 Encounters:  11/17/23 92/60  09/19/23 118/63  08/24/23 100/62   Pts BP is in lower range, due to fatigue and lightheaded feeling, will stop hydrochlorothiazide , Continue other BP medications. Encouraged hydration and compression stockings. Tachycardia on exam, pt  asymptomatic. In office EKG was abnormal, placed cardiology referral.  He is not aware of cardiomyopathy diagnosis, no ECHO on file.   Return in about 4 weeks (around 12/15/2023) for HTN with PCP.   68 y/o male with history of testicular cancer, DMT2, hypertension, OSA on CPAP, neuropathy  Concerns:  Feeling tired and sleepy for the last few months.   Neuropathy due to cancer treatment he had for testicular cancer. Numbness in and pain feet due to Neuropathy , hx of testicluar cancer was on cisplatin, states it causes the neuropathy. States he learned to live with it.  Follows Oncology yearly.  He tried to quit taking Lyrica  but hurts more when he is not taking it. Takes Lyrica .   Diabetes type 2:  Follows Endocrinology.  Synjardy ER and Ozempic.   HTN:  On hydrochlorothiazide , lisinopril , metoprolol    OSA On cpap. Requesting sleep study referral since his sleep study was old.         Review of Systems  All other systems reviewed and are negative.     Objective:     BP 92/60   Pulse 99   Ht 5' 10 (1.778 m)   Wt  206 lb (93.4 kg)   SpO2 96%   BMI 29.56 kg/m     Physical Exam Vitals and nursing note reviewed.  Constitutional:      Appearance: Normal appearance.  HENT:     Head: Normocephalic.     Right Ear: External ear normal.     Left Ear: External ear normal.  Eyes:     Conjunctiva/sclera: Conjunctivae normal.  Cardiovascular:     Rate and Rhythm: Normal rate.  Pulmonary:     Effort: Pulmonary effort is normal. No respiratory distress.  Abdominal:     Palpations: Abdomen is soft.  Musculoskeletal:        General: Normal range of motion.  Skin:    General: Skin is warm.  Neurological:     Mental Status: He is alert and oriented to person, place, and time.  Psychiatric:        Mood and Affect: Mood normal.      No results found for any visits on 11/17/23.     The ASCVD Risk score (Arnett DK, et al., 2019) failed to calculate for the  following reasons:   The valid total cholesterol range is 130 to 320 mg/dL      Ladoris MARLA Ny, MD

## 2023-11-17 NOTE — Assessment & Plan Note (Signed)
 Pts BP is in lower range, due to fatigue and lightheaded feeling, will DC hydrochlorothiazide , Continue other BP medications. Encouraged hydration and compression stockings. Tachycardia on exam, pt asymptomatic. In office EKG was abnormal, placed cardiology referral.  He is not aware of cardiomyopathy diagnosis, no ECHO on file.

## 2023-11-18 DIAGNOSIS — E785 Hyperlipidemia, unspecified: Secondary | ICD-10-CM | POA: Diagnosis not present

## 2023-11-18 DIAGNOSIS — R972 Elevated prostate specific antigen [PSA]: Secondary | ICD-10-CM | POA: Diagnosis not present

## 2023-11-18 DIAGNOSIS — I10 Essential (primary) hypertension: Secondary | ICD-10-CM | POA: Diagnosis not present

## 2023-11-18 DIAGNOSIS — R5383 Other fatigue: Secondary | ICD-10-CM | POA: Diagnosis not present

## 2023-11-19 ENCOUNTER — Ambulatory Visit: Payer: Self-pay | Admitting: Family Medicine

## 2023-11-19 LAB — CBC WITH DIFFERENTIAL/PLATELET
Basophils Absolute: 0 x10E3/uL (ref 0.0–0.2)
Basos: 0 %
EOS (ABSOLUTE): 0.1 x10E3/uL (ref 0.0–0.4)
Eos: 2 %
Hematocrit: 40 % (ref 37.5–51.0)
Hemoglobin: 13.3 g/dL (ref 13.0–17.7)
Immature Grans (Abs): 0 x10E3/uL (ref 0.0–0.1)
Immature Granulocytes: 0 %
Lymphocytes Absolute: 1.7 x10E3/uL (ref 0.7–3.1)
Lymphs: 24 %
MCH: 30.1 pg (ref 26.6–33.0)
MCHC: 33.3 g/dL (ref 31.5–35.7)
MCV: 91 fL (ref 79–97)
Monocytes Absolute: 0.6 x10E3/uL (ref 0.1–0.9)
Monocytes: 8 %
Neutrophils Absolute: 4.6 x10E3/uL (ref 1.4–7.0)
Neutrophils: 66 %
Platelets: 256 x10E3/uL (ref 150–450)
RBC: 4.42 x10E6/uL (ref 4.14–5.80)
RDW: 12.7 % (ref 11.6–15.4)
WBC: 7 x10E3/uL (ref 3.4–10.8)

## 2023-11-19 LAB — PSA: Prostate Specific Ag, Serum: 3.5 ng/mL (ref 0.0–4.0)

## 2023-11-19 LAB — COMPREHENSIVE METABOLIC PANEL WITH GFR
ALT: 32 IU/L (ref 0–44)
AST: 30 IU/L (ref 0–40)
Albumin: 4.4 g/dL (ref 3.9–4.9)
Alkaline Phosphatase: 66 IU/L (ref 44–121)
BUN/Creatinine Ratio: 19 (ref 10–24)
BUN: 24 mg/dL (ref 8–27)
Bilirubin Total: 0.4 mg/dL (ref 0.0–1.2)
CO2: 23 mmol/L (ref 20–29)
Calcium: 9.6 mg/dL (ref 8.6–10.2)
Chloride: 102 mmol/L (ref 96–106)
Creatinine, Ser: 1.27 mg/dL (ref 0.76–1.27)
Globulin, Total: 2.3 g/dL (ref 1.5–4.5)
Glucose: 116 mg/dL — ABNORMAL HIGH (ref 70–99)
Potassium: 4.8 mmol/L (ref 3.5–5.2)
Sodium: 140 mmol/L (ref 134–144)
Total Protein: 6.7 g/dL (ref 6.0–8.5)
eGFR: 63 mL/min/1.73 (ref >59)

## 2023-11-19 LAB — LIPID PANEL
Chol/HDL Ratio: 2.4 ratio (ref 0.0–5.0)
Cholesterol, Total: 106 mg/dL (ref 100–199)
HDL: 44 mg/dL (ref >39)
LDL Chol Calc (NIH): 49 mg/dL (ref 0–99)
Triglycerides: 55 mg/dL (ref 0–149)
VLDL Cholesterol Cal: 13 mg/dL (ref 5–40)

## 2023-11-19 LAB — TSH: TSH: 2.14 u[IU]/mL (ref 0.450–4.500)

## 2023-11-23 DIAGNOSIS — Z8249 Family history of ischemic heart disease and other diseases of the circulatory system: Secondary | ICD-10-CM | POA: Diagnosis not present

## 2023-11-23 DIAGNOSIS — I10 Essential (primary) hypertension: Secondary | ICD-10-CM | POA: Diagnosis not present

## 2023-11-23 DIAGNOSIS — E785 Hyperlipidemia, unspecified: Secondary | ICD-10-CM | POA: Diagnosis not present

## 2023-11-23 DIAGNOSIS — R9431 Abnormal electrocardiogram [ECG] [EKG]: Secondary | ICD-10-CM | POA: Diagnosis not present

## 2023-11-27 DIAGNOSIS — G4733 Obstructive sleep apnea (adult) (pediatric): Secondary | ICD-10-CM | POA: Diagnosis not present

## 2023-11-30 ENCOUNTER — Other Ambulatory Visit: Payer: Self-pay | Admitting: Family Medicine

## 2023-11-30 DIAGNOSIS — I10 Essential (primary) hypertension: Secondary | ICD-10-CM

## 2023-11-30 NOTE — Telephone Encounter (Unsigned)
 Copied from CRM #[id number]. Topic: Clinical - Medication Refill >> Nov 30, 2023 11:40 AM Myrick T wrote: Medication: metoprolol  tartrate (LOPRESSOR ) 50 MG tablet and lisinopril  (ZESTRIL ) 20 MG tablet  Has the patient contacted their pharmacy? No  This is the patient's preferred pharmacy:  [phone] [address], [place] - [address] [place] [address] Phone: [phone] Fax: [phone]  Is this the correct pharmacy for this prescription? Yes  Has the prescription been filled recently? Yes  Is the patient out of the medication? No  Has the patient been seen for an appointment in the last year OR does the patient have an upcoming appointment? Yes  Can we respond through MyChart? Yes  Agent: Please be advised that Rx refills may take up to 3 business days. We ask that you follow-up with your pharmacy.

## 2023-12-01 MED ORDER — LISINOPRIL 20 MG PO TABS: 20.0000 mg | ORAL_TABLET | Freq: Every day | ORAL | 0 refills | Status: DC

## 2023-12-01 MED ORDER — METOPROLOL TARTRATE 50 MG PO TABS: 50.0000 mg | ORAL_TABLET | Freq: Two times a day (BID) | ORAL | 1 refills | Status: AC

## 2023-12-01 NOTE — Telephone Encounter (Signed)
 Requested by interface surescripts. Future visit 12/15/23. Requested Prescriptions  Pending Prescriptions Disp Refills   metoprolol  tartrate (LOPRESSOR ) 50 MG tablet 180 tablet 0    Sig: Take 1 tablet (50 mg total) by mouth 2 (two) times daily.     Cardiovascular:  Beta Blockers Passed - 12/01/2023  4:30 PM      Passed - Last BP in normal range    BP Readings from Last 1 Encounters:  11/17/23 92/60         Passed - Last Heart Rate in normal range    Pulse Readings from Last 1 Encounters:  11/17/23 99         Passed - Valid encounter within last 6 months    Recent Outpatient Visits           2 weeks ago Tachycardia   [facility], Vinay K, MD   3 months ago Primary osteoarthritis of right hip   [place] [facility] at [facility], Selinda PARAS, MD   5 months ago Acute maxillary sinusitis, recurrence not specified   [place] [facility] at [facility], MD               lisinopril  (ZESTRIL ) 20 MG tablet 30 tablet 0    Sig: Take 1 tablet (20 mg total) by mouth daily.     Cardiovascular:  ACE Inhibitors Passed - 12/01/2023  4:30 PM      Passed - Cr in normal range and within 180 days    Creatinine, Ser  Date Value Ref Range Status  11/18/2023 1.27 0.76 - 1.27 mg/dL Final         Passed - K in normal range and within 180 days    Potassium  Date Value Ref Range Status  11/18/2023 4.8 3.5 - 5.2 mmol/L Final         Passed - Patient is not pregnant      Passed - Last BP in normal range    BP Readings from Last 1 Encounters:  11/17/23 92/60         Passed - Valid encounter within last 6 months    Recent Outpatient Visits           2 weeks ago Tachycardia   [facility] at [facility], Vinay K, MD   3 months ago Primary osteoarthritis of right hip   [facility] at  [facility], Selinda PARAS, MD   5 months ago Acute maxillary sinusitis, recurrence not specified   [facility] at [facility] Cathryne JAYSON, MD

## 2023-12-01 NOTE — Telephone Encounter (Signed)
 Requested Prescriptions  Pending Prescriptions Disp Refills   metoprolol  tartrate (LOPRESSOR ) 50 MG tablet 180 tablet 1    Sig: Take 1 tablet (50 mg total) by mouth 2 (two) times daily.     Cardiovascular:  Beta Blockers Passed - 12/01/2023  4:47 PM      Passed - Last BP in normal range    BP Readings from Last 1 Encounters:  11/17/23 92/60         Passed - Last Heart Rate in normal range    Pulse Readings from Last 1 Encounters:  11/17/23 99         Passed - Valid encounter within last 6 months    Recent Outpatient Visits           2 weeks ago Tachycardia   [facility] at [facility], Vinay K, MD   3 months ago Primary osteoarthritis of right hip   [facility] at [facility], Selinda PARAS, MD   5 months ago Acute maxillary sinusitis, recurrence not specified    Primary [facility] & Sports Medicine at [facility], Deanna C, MD              Signed Prescriptions Disp Refills   lisinopril  (ZESTRIL ) 20 MG tablet 30 tablet 0    Sig: Take 1 tablet (20 mg total) by mouth daily.     Cardiovascular:  ACE Inhibitors Passed - 12/01/2023  4:47 PM      Passed - Cr in normal range and within 180 days    Creatinine, Ser  Date Value Ref Range Status  11/18/2023 1.27 0.76 - 1.27 mg/dL Final         Passed - K in normal range and within 180 days    Potassium  Date Value Ref Range Status  11/18/2023 4.8 3.5 - 5.2 mmol/L Final         Passed - Patient is not pregnant      Passed - Last BP in normal range    BP Readings from Last 1 Encounters:  11/17/23 92/60         Passed - Valid encounter within last 6 months    Recent Outpatient Visits           2 weeks ago Tachycardia   [facility] at  [facility], Vinay K, MD   3 months ago Primary osteoarthritis of right hip   [facility] Primary Care & Sports Medicine at [facility], Selinda PARAS, MD   5 months ago Acute maxillary sinusitis, recurrence not specified   [facility] Primary Care & Sports Medicine at [facility] Cathryne JAYSON, MD

## 2023-12-02 DIAGNOSIS — R9431 Abnormal electrocardiogram [ECG] [EKG]: Secondary | ICD-10-CM | POA: Diagnosis not present

## 2023-12-08 ENCOUNTER — Other Ambulatory Visit: Payer: Self-pay | Admitting: Family Medicine

## 2023-12-08 DIAGNOSIS — T451X5A Adverse effect of antineoplastic and immunosuppressive drugs, initial encounter: Secondary | ICD-10-CM

## 2023-12-08 NOTE — Telephone Encounter (Signed)
 Requested medications are due for refill today.  yes  Requested medications are on the active medications list.  yes  Last refill. 11/04/2023 #60 0 rf  Future visit scheduled.   yes  Notes to clinic.  Refill not delegated.    Requested Prescriptions  Pending Prescriptions Disp Refills   pregabalin  (LYRICA ) 100 MG capsule [Pharmacy Med Name: PREGABALIN  100 MG CAPSULE] 60 capsule 0    Sig: TAKE 1 CAPSULE BY MOUTH TWICE A DAY     Not Delegated - Neurology:  Anticonvulsants - Controlled - pregabalin  Failed - 12/08/2023  3:44 PM      Failed - This refill cannot be delegated      Passed - Cr in normal range and within 360 days    Creatinine, Ser  Date Value Ref Range Status  11/18/2023 1.27 0.76 - 1.27 mg/dL Final         Passed - Completed PHQ-2 or PHQ-9 in the last 360 days      Passed - Valid encounter within last 12 months    Recent Outpatient Visits           3 weeks ago Tachycardia   [facility] at [facility] Vinay K, MD   3 months ago Primary osteoarthritis of right hip   [facility] Primary Care & [facility] Medicine at [facility], Selinda PARAS, MD   5 months ago Acute maxillary sinusitis, recurrence not specified   [facility] Primary Care & Sports Medicine at [facility] Cathryne JAYSON, MD

## 2023-12-08 NOTE — Telephone Encounter (Signed)
 Please review.  KP

## 2023-12-09 ENCOUNTER — Telehealth: Payer: Self-pay

## 2023-12-09 NOTE — Telephone Encounter (Signed)
 Called [facility] office to have them fax over the form to our office to have PCP complete form.    Awaiting fax.

## 2023-12-09 NOTE — Telephone Encounter (Signed)
 Copied from CRM [id number]. Topic: Clinical - Medical Advice >> Dec 09, 2023 11:55 AM Tiffini S wrote: Reason for CRM: Demar with [facility] [phone] called about addition information that is needed to complete the referral- called [facility] spoke with Angela/ they are on the lunch schedule.    Need the new order with the study type, pcp is ordering a CPAP will need the patient previous sleep study- fax number: [phone] Attn: Demar

## 2023-12-15 ENCOUNTER — Ambulatory Visit (INDEPENDENT_AMBULATORY_CARE_PROVIDER_SITE_OTHER): Admitting: Family Medicine

## 2023-12-15 ENCOUNTER — Encounter: Payer: Self-pay | Admitting: Family Medicine

## 2023-12-15 VITALS — BP 122/70 | HR 97 | Ht 70.0 in | Wt 204.0 lb

## 2023-12-15 DIAGNOSIS — Z23 Encounter for immunization: Secondary | ICD-10-CM

## 2023-12-15 DIAGNOSIS — R5383 Other fatigue: Secondary | ICD-10-CM

## 2023-12-15 DIAGNOSIS — I10 Essential (primary) hypertension: Secondary | ICD-10-CM | POA: Diagnosis not present

## 2023-12-15 MED ORDER — LISINOPRIL 20 MG PO TABS: 20.0000 mg | ORAL_TABLET | Freq: Every day | ORAL | 1 refills | Status: AC

## 2023-12-15 NOTE — Progress Notes (Signed)
 Established Patient Office Visit  Subjective   Patient ID: Jerry Harmon, male    DOB: Jun 03, 1955  Age: 68 y.o. MRN: [id number]  Chief Complaint  Patient presents with   Hypertension     Assessment & Plan:   Problem List Items Addressed This Visit       Cardiovascular and Mediastinum   Essential hypertension   Relevant Medications   lisinopril  (ZESTRIL ) 20 MG tablet   Other Visit Diagnoses       Encounter for immunization    -  Primary   Relevant Orders   Flu vaccine HIGH DOSE PF(Fluzone Trivalent) (Completed)     Need for immunization against influenza         Other fatigue         Assessment and Plan Assessment & Plan Essential hypertension with mild left ventricular hypertrophy Blood pressure controlled at 120/70 mmHg. Echocardiogram shows mild left ventricular hypertrophy, likely from long-standing hypertension, with normal heart function and good ejection fraction. Current antihypertensive regimen effective. - Continue lisinopril  and metoprolol . - Follow up with cardiology in three months.  Fatigue Fatigue improved, likely related to previous low blood pressure and deconditioning. Lightheadedness improved. Deconditioning possibly due to inactivity and age. - Encourage increased physical activity.   General Health Maintenance Due for flu shot. - Administer flu shot today.  Follow-Up Routine follow-up needed. - Schedule follow-up appointment in six months.    Return in about 6 months (around 06/13/2024).   HPI Discussed the use of AI scribe software for clinical note transcription with the patient, who gave verbal consent to proceed.  History of Present Illness Jerry Harmon is a 68 year old male with hypertension who presents for follow-up of blood pressure management.  His blood pressure is now stable at 120/70 mmHg after discontinuing hydrochlorothiazide  due to previously low readings. He continues to take lisinopril  and metoprolol , along with aspirin  and cholesterol medication. He uses [employer]. Fatigue and lightheadedness have improved with medication adjustments, though some tiredness persists. He had trouble obtaining lisinopril , but a refill has been sent. He received a message that his echocardiogram results were overall okay.     Review of Systems  All other systems reviewed and are negative.     Objective:     BP 122/70   Pulse 97   Ht 5' 10 (1.778 m)   Wt 204 lb (92.5 kg)   SpO2 97%   BMI 29.27 kg/m    Physical Exam Vitals and nursing note reviewed.  Constitutional:      Appearance: Normal appearance.  HENT:     Head: Normocephalic.     Right Ear: External ear normal.     Left Ear: External ear normal.  Eyes:     Conjunctiva/sclera: Conjunctivae normal.  Cardiovascular:     Rate and Rhythm: Normal rate.  Pulmonary:     Effort: Pulmonary effort is normal. No respiratory distress.  Abdominal:     Palpations: Abdomen is soft.  Musculoskeletal:        General: Normal range of motion.  Skin:    General: Skin is warm.  Neurological:     Mental Status: He is alert and oriented to person, place, and time.  Psychiatric:        Mood and Affect: Mood normal.      No results found for any visits on 12/15/23.    The ASCVD Risk score (Arnett DK, et al., 2019) failed to calculate for the following reasons:   The valid  total cholesterol range is 130 to 320 mg/dL      Ladoris MARLA Ny, MD

## 2023-12-21 NOTE — Telephone Encounter (Signed)
 Called [facility] office to have them fax over the form to our office to have PCP complete form.    Awaiting fax.

## 2023-12-21 NOTE — Telephone Encounter (Signed)
 Jerry Harmon with [facility] returning call call back # [phone], caller states he did not receive fax (previous fax # was documented incorrectly), please fax # to [phone]. If you call back please leave a detail message for Jerry Harmon with an update regarding status of form.

## 2023-12-23 NOTE — Telephone Encounter (Signed)
 Forms received, completed by PCP on 12/23/2023.   Faxed to [phone]

## 2024-01-04 ENCOUNTER — Other Ambulatory Visit: Payer: Self-pay | Admitting: Family Medicine

## 2024-01-04 DIAGNOSIS — G620 Drug-induced polyneuropathy: Secondary | ICD-10-CM

## 2024-01-05 NOTE — Telephone Encounter (Signed)
 Please review.  KP

## 2024-01-05 NOTE — Telephone Encounter (Signed)
 Requested medications are due for refill today.  yes  Requested medications are on the active medications list.  yes  Last refill. 12/08/2023 #60 0 rf  Future visit scheduled.   yes  Notes to clinic.  Refill not delegated.    Requested Prescriptions  Pending Prescriptions Disp Refills   pregabalin  (LYRICA ) 100 MG capsule [Pharmacy Med Name: PREGABALIN  100 MG CAPSULE] 60 capsule 0    Sig: TAKE 1 CAPSULE BY MOUTH TWICE A DAY     Not Delegated - Neurology:  Anticonvulsants - Controlled - pregabalin  Failed - 01/05/2024  4:43 PM      Failed - This refill cannot be delegated      Passed - Cr in normal range and within 360 days    Creatinine, Ser  Date Value Ref Range Status  11/18/2023 1.27 0.76 - 1.27 mg/dL Final         Passed - Completed PHQ-2 or PHQ-9 in the last 360 days      Passed - Valid encounter within last 12 months    Recent Outpatient Visits           3 weeks ago Encounter for immunization   [facility] Medicine at [facility], Vinay K, MD   1 month ago Tachycardia   [facility] Primary Care & Sports Medicine at [facility], Vinay K, MD   4 months ago Primary osteoarthritis of right hip   [facility] Primary Care [facility] Medicine at [facility], Selinda PARAS, MD   6 months ago Acute maxillary sinusitis, recurrence not specified   [facility] Primary Care & Sports Medicine at [facility] Cathryne JAYSON, MD       Future Appointments             In 5 months Kotturi, Vinay K, MD [facility] Primary Care & Sports Medicine at [facility], [phone] [place]

## 2024-01-07 ENCOUNTER — Ambulatory Visit: Admitting: Family Medicine

## 2024-01-26 ENCOUNTER — Other Ambulatory Visit: Payer: Self-pay

## 2024-01-26 DIAGNOSIS — E119 Type 2 diabetes mellitus without complications: Secondary | ICD-10-CM

## 2024-01-26 MED ORDER — ONETOUCH DELICA PLUS LANCET33G MISC: 2 refills | Status: AC

## 2024-01-29 DIAGNOSIS — M1611 Unilateral primary osteoarthritis, right hip: Secondary | ICD-10-CM | POA: Diagnosis not present

## 2024-02-01 ENCOUNTER — Other Ambulatory Visit: Payer: Self-pay

## 2024-02-01 DIAGNOSIS — J301 Allergic rhinitis due to pollen: Secondary | ICD-10-CM

## 2024-02-01 MED ORDER — LORATADINE 10 MG PO TABS: 10.0000 mg | ORAL_TABLET | Freq: Every day | ORAL | 1 refills | Status: AC

## 2024-02-07 ENCOUNTER — Other Ambulatory Visit: Payer: Self-pay | Admitting: Family Medicine

## 2024-02-07 DIAGNOSIS — G620 Drug-induced polyneuropathy: Secondary | ICD-10-CM

## 2024-02-09 DIAGNOSIS — E119 Type 2 diabetes mellitus without complications: Secondary | ICD-10-CM | POA: Diagnosis not present

## 2024-02-09 DIAGNOSIS — H04123 Dry eye syndrome of bilateral lacrimal glands: Secondary | ICD-10-CM | POA: Diagnosis not present

## 2024-02-09 NOTE — Telephone Encounter (Signed)
 Requested medications are due for refill today.  yes  Requested medications are on the active medications list.  yes  Last refill. 01/06/2024 #60 0 rf  Future visit scheduled.   Yes - next year  Notes to clinic.  Refill not delegated.    Requested Prescriptions  Pending Prescriptions Disp Refills   pregabalin  (LYRICA ) 100 MG capsule [Pharmacy Med Name: PREGABALIN  100 MG CAPSULE] 60 capsule 0    Sig: TAKE 1 CAPSULE BY MOUTH TWICE A DAY     Not Delegated - Neurology:  Anticonvulsants - Controlled - pregabalin  Failed - 02/09/2024  2:24 PM      Failed - This refill cannot be delegated      Passed - Cr in normal range and within 360 days    Creatinine, Ser  Date Value Ref Range Status  11/18/2023 1.27 0.76 - 1.27 mg/dL Final         Passed - Completed PHQ-2 or PHQ-9 in the last 360 days      Passed - Valid encounter within last 12 months    Recent Outpatient Visits           1 month ago Encounter for immunization   [facility] Care [facility] at [facility], Vinay K, MD   2 months ago Tachycardia   [facility] Care & Sports Medicine at [facility], Vinay K, MD   5 months ago Primary osteoarthritis of right hip   [facility] Care [facility] Sports Medicine at [facility], Selinda PARAS, MD   7 months ago Acute maxillary sinusitis, recurrence not specified   [facility] Primary Care & Sports Medicine at [facility] Cathryne JAYSON, MD       Future Appointments             In 4 months Kotturi, Vinay K, MD [facility] Primary Care & Sports Medicine at [facility], [phone] [place]

## 2024-02-10 DIAGNOSIS — M1611 Unilateral primary osteoarthritis, right hip: Secondary | ICD-10-CM | POA: Diagnosis not present

## 2024-02-11 IMAGING — CR DG FOOT COMPLETE 3+V*R*
3 series · 3 of 3 positions shown · non-contrast
Comparison: None.

CLINICAL DATA: Right foot pain at 4th MTP joint, neuropathy. Jammed
foot on door frame 3 weeks ago.

EXAM:
RIGHT FOOT COMPLETE - 3+ VIEW

[foot ap]
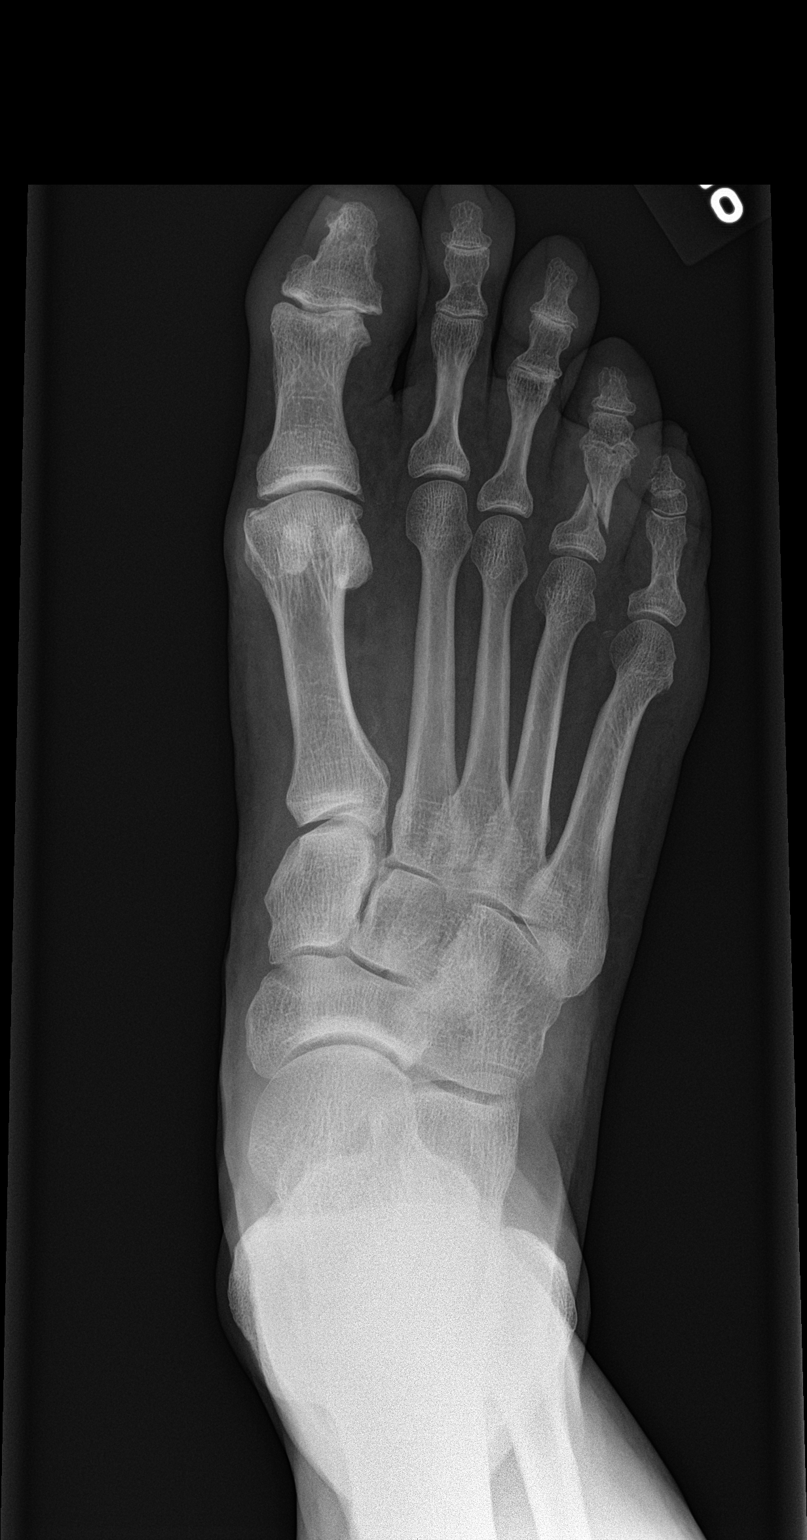

[foot obl]
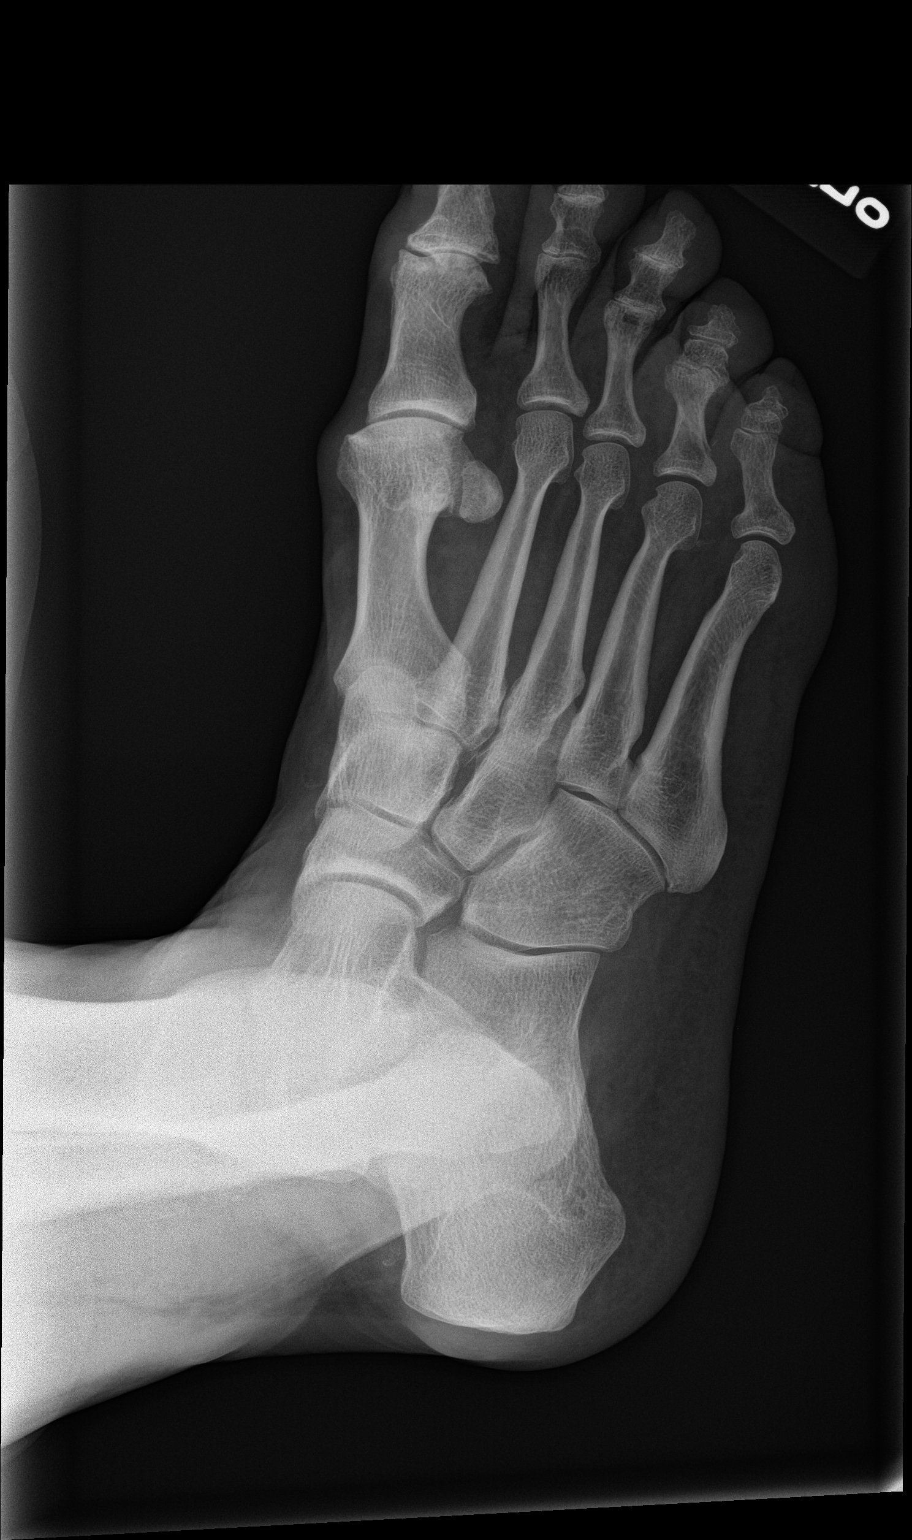

[foot lat]
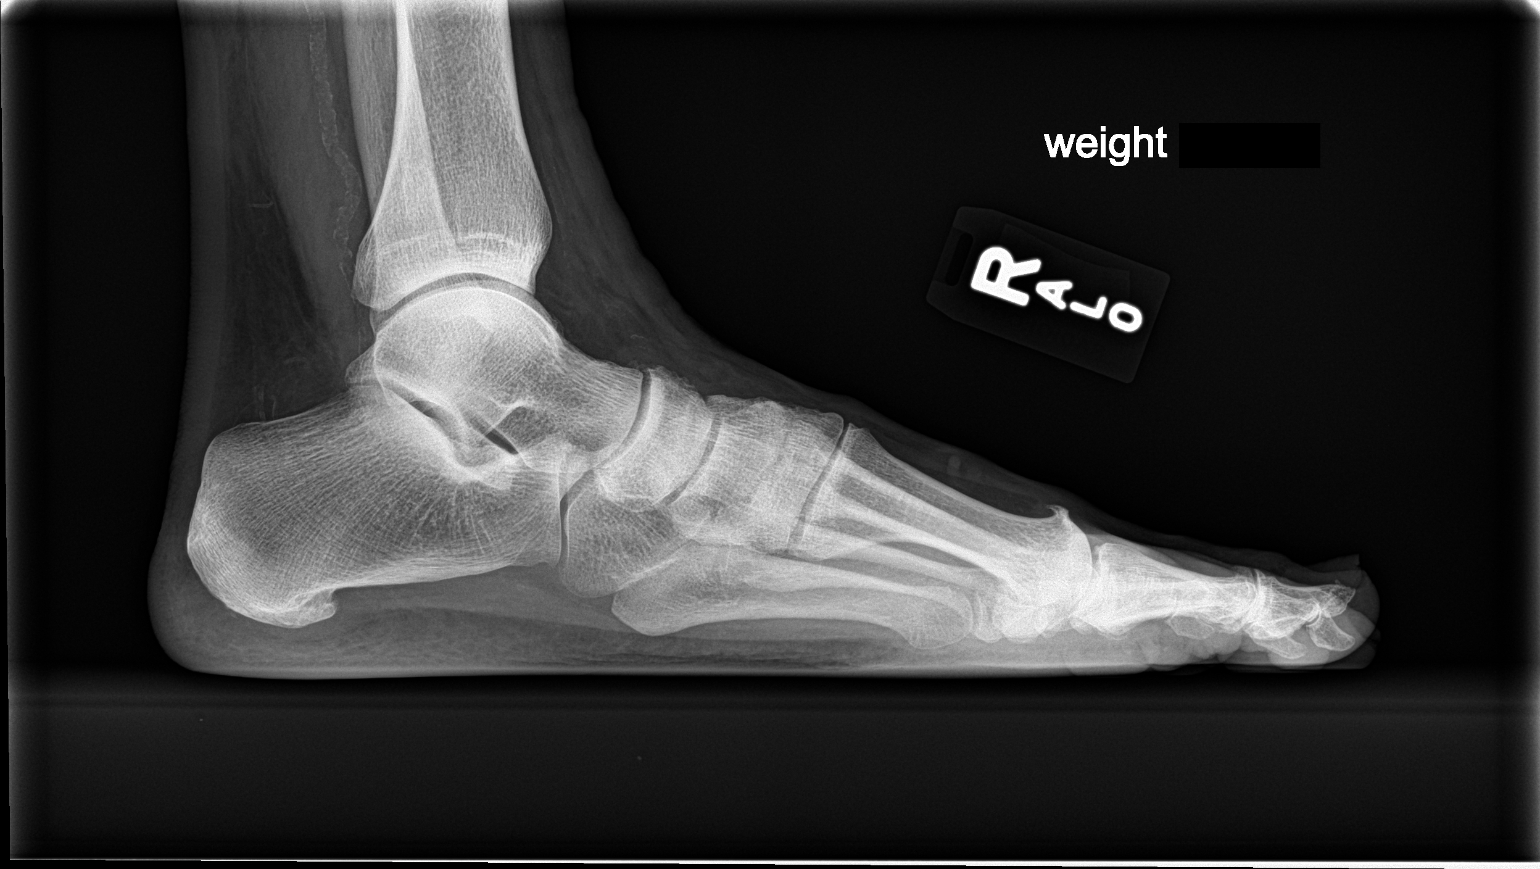

[3 of 3 positions shown; findings below may reference images not displayed]

FINDINGS: There is oblique linear lucency within the proximal to mid shaft of
the proximal phalanx of the fourth toe, a nondisplaced fracture with
up to 1.5 mm diastasis. No intra-articular extension. Moderate joint
space narrowing of the interphalangeal joints diffusely.

Moderate lateral mild medial great toe metatarsophalangeal joint
space narrowing and mild peripheral degenerative osteophytes.
Moderate dorsal metatarsal head degenerative osteophytosis.

Moderate lateral and mild medial great toe interphalangeal joint
space narrowing.

Tiny plantar calcaneal heel spur.

Mild vascular calcifications.
IMPRESSION: :
IMPRESSION: 1. Acute, nondisplaced fracture of the proximal to mid shaft of the
proximal phalanx of fourth toe.
2. Mild-to-moderate great toe metatarsophalangeal and
interphalangeal osteoarthritis.

## 2024-02-15 DIAGNOSIS — I152 Hypertension secondary to endocrine disorders: Secondary | ICD-10-CM | POA: Diagnosis not present

## 2024-02-15 DIAGNOSIS — E1169 Type 2 diabetes mellitus with other specified complication: Secondary | ICD-10-CM | POA: Diagnosis not present

## 2024-02-15 DIAGNOSIS — E1159 Type 2 diabetes mellitus with other circulatory complications: Secondary | ICD-10-CM | POA: Diagnosis not present

## 2024-02-15 DIAGNOSIS — E119 Type 2 diabetes mellitus without complications: Secondary | ICD-10-CM | POA: Diagnosis not present

## 2024-02-17 DIAGNOSIS — G609 Hereditary and idiopathic neuropathy, unspecified: Secondary | ICD-10-CM | POA: Diagnosis not present

## 2024-02-17 DIAGNOSIS — G4733 Obstructive sleep apnea (adult) (pediatric): Secondary | ICD-10-CM | POA: Diagnosis not present

## 2024-02-17 DIAGNOSIS — N401 Enlarged prostate with lower urinary tract symptoms: Secondary | ICD-10-CM | POA: Diagnosis not present

## 2024-02-17 DIAGNOSIS — E785 Hyperlipidemia, unspecified: Secondary | ICD-10-CM | POA: Diagnosis not present

## 2024-02-25 DIAGNOSIS — G4733 Obstructive sleep apnea (adult) (pediatric): Secondary | ICD-10-CM | POA: Diagnosis not present

## 2024-04-06 DIAGNOSIS — G4733 Obstructive sleep apnea (adult) (pediatric): Secondary | ICD-10-CM | POA: Diagnosis not present

## 2024-04-06 DIAGNOSIS — J0140 Acute pansinusitis, unspecified: Secondary | ICD-10-CM | POA: Diagnosis not present

## 2024-04-18 ENCOUNTER — Ambulatory Visit: Attending: Otolaryngology

## 2024-04-22 NOTE — Progress Notes (Signed)
 Jerry Harmon                                          MRN: [id number]   04/22/2024   The [facility] Quality Team Specialist reviewed this patient medical record for the purposes of chart review for care gap closure. The following were reviewed: abstraction for care gap closure-glycemic status assessment.    [facility] Quality Team

## 2024-04-29 ENCOUNTER — Telehealth: Payer: Self-pay | Admitting: Family Medicine

## 2024-04-29 NOTE — Telephone Encounter (Signed)
 Copied from CRM #[id number]. Topic: General - Other >> Apr 29, 2024 10:36 AM Deaijah H wrote: Reason for CRM: Lorene w/ [facility] called in stating she also need last OV notes faxed.

## 2024-05-04 NOTE — Telephone Encounter (Signed)
 No phone number documented to call back. Unable to proceed with message.

## 2024-05-05 ENCOUNTER — Telehealth: Payer: Self-pay | Admitting: Family Medicine

## 2024-05-05 ENCOUNTER — Telehealth: Payer: Self-pay

## 2024-05-05 NOTE — Telephone Encounter (Signed)
 Called [facility] to speak to personal, left message with call center to inform staff that patient is no longer being seen at our practice. Was last seen Sep 2025, establish with new provider Jerry Harmon) at [facility] on 02/2024. Information for other office given to call center to leave with my message.

## 2024-05-05 NOTE — Telephone Encounter (Signed)
 Copied from CRM #[id number]. Topic: General - Other >> May 05, 2024 12:29 PM Roselie BROCKS wrote: Reason for CRM: Elenor from [facility] calling for clinic, she states patient has surgery scheduled next week and [facility] is needing office notes and echo faxed to them, they received a fax this morning but it was for Clearance and labs, and they already have that.

## 2024-05-05 NOTE — Telephone Encounter (Signed)
 Copied from CRM #[id number]. Topic: General - Call Back - No Documentation >> May 05, 2024  8:10 AM Olam RAMAN wrote: Reason for CRM: Elenor from [facility] pt is sched for surgery andf need last OV and echo Fax: [phone] Cb: [phone] ASKED FOR URGENT REQUEST

## 2024-05-05 NOTE — Telephone Encounter (Signed)
 Spoke with Rosaline PARAS at [facility] and informed her patient is no longer being seen at our office. Gave her Dr. Salli information to [facility] to [facility] scheduler.

## 2024-05-06 ENCOUNTER — Telehealth: Payer: Self-pay | Admitting: Family Medicine

## 2024-05-06 NOTE — Telephone Encounter (Signed)
 Spoke with patient informed him that information requested was sent to the requesting office. Patient verbalized understanding.

## 2024-05-06 NOTE — Telephone Encounter (Signed)
 Called Jerry Harmon with [facility] to inform her that requested paperwork was sent over to her office. There was no answer, voicemail message was left.

## 2024-05-06 NOTE — Telephone Encounter (Signed)
 Office notes from 12/15/2023 with Dr. Sol and echocardiogram from [facility] that was completed on 12/02/2023 faxed to [facility]. [name] Fax #: [phone]

## 2024-06-13 ENCOUNTER — Ambulatory Visit: Admitting: Family Medicine
